# Patient Record
Sex: Female | Born: 1975
Health system: Southern US, Community
[De-identification: ages and names within clinical notes are randomized; demographics above are authoritative.]

## PROBLEM LIST (undated history)

## (undated) DIAGNOSIS — R0789 Other chest pain: Secondary | ICD-10-CM

## (undated) DIAGNOSIS — K279 Peptic ulcer, site unspecified, unspecified as acute or chronic, without hemorrhage or perforation: Secondary | ICD-10-CM

## (undated) DIAGNOSIS — E7849 Other hyperlipidemia: Secondary | ICD-10-CM

## (undated) DIAGNOSIS — R7303 Prediabetes: Secondary | ICD-10-CM

## (undated) DIAGNOSIS — F419 Anxiety disorder, unspecified: Secondary | ICD-10-CM

## (undated) DIAGNOSIS — I951 Orthostatic hypotension: Secondary | ICD-10-CM

## (undated) DIAGNOSIS — E119 Type 2 diabetes mellitus without complications: Secondary | ICD-10-CM

## (undated) DIAGNOSIS — G43909 Migraine, unspecified, not intractable, without status migrainosus: Secondary | ICD-10-CM

## (undated) DIAGNOSIS — Z8639 Personal history of other endocrine, nutritional and metabolic disease: Secondary | ICD-10-CM

## (undated) DIAGNOSIS — K8020 Calculus of gallbladder without cholecystitis without obstruction: Secondary | ICD-10-CM

## (undated) HISTORY — DX: Migraine, unspecified, not intractable, without status migrainosus: G43.909

## (undated) HISTORY — DX: Anxiety disorder, unspecified: F41.9

## (undated) HISTORY — DX: Orthostatic hypotension: I95.1

## (undated) HISTORY — DX: Other hyperlipidemia: E78.49

## (undated) HISTORY — DX: Calculus of gallbladder without cholecystitis without obstruction: K80.20

## (undated) HISTORY — DX: Other chest pain: R07.89

## (undated) HISTORY — DX: Prediabetes: R73.03

## (undated) HISTORY — DX: Peptic ulcer, site unspecified, unspecified as acute or chronic, without hemorrhage or perforation: K27.9

## (undated) HISTORY — DX: Personal history of other endocrine, nutritional and metabolic disease: Z86.39

---

## 2000-03-10 ENCOUNTER — Inpatient Hospital Stay (HOSPITAL_COMMUNITY): Admission: AD | Admit: 2000-03-10 | Discharge: 2000-03-10 | Payer: Self-pay | Admitting: Obstetrics

## 2000-03-10 ENCOUNTER — Encounter: Payer: Self-pay | Admitting: *Deleted

## 2000-04-17 ENCOUNTER — Inpatient Hospital Stay (HOSPITAL_COMMUNITY): Admission: AD | Admit: 2000-04-17 | Discharge: 2000-04-17 | Payer: Self-pay | Admitting: Obstetrics

## 2000-04-17 ENCOUNTER — Encounter: Payer: Self-pay | Admitting: Obstetrics

## 2000-07-16 ENCOUNTER — Ambulatory Visit (HOSPITAL_COMMUNITY): Admission: RE | Admit: 2000-07-16 | Discharge: 2000-07-16 | Payer: Self-pay | Admitting: *Deleted

## 2000-07-17 ENCOUNTER — Encounter: Payer: Self-pay | Admitting: *Deleted

## 2000-07-17 ENCOUNTER — Inpatient Hospital Stay (HOSPITAL_COMMUNITY): Admission: RE | Admit: 2000-07-17 | Discharge: 2000-07-17 | Payer: Self-pay | Admitting: *Deleted

## 2000-08-15 ENCOUNTER — Encounter: Admission: RE | Admit: 2000-08-15 | Discharge: 2000-08-15 | Payer: Self-pay | Admitting: Obstetrics

## 2000-08-16 ENCOUNTER — Encounter: Payer: Self-pay | Admitting: *Deleted

## 2000-08-27 ENCOUNTER — Inpatient Hospital Stay (HOSPITAL_COMMUNITY): Admission: RE | Admit: 2000-08-27 | Discharge: 2000-08-27 | Payer: Self-pay | Admitting: *Deleted

## 2000-08-29 ENCOUNTER — Encounter: Admission: RE | Admit: 2000-08-29 | Discharge: 2000-08-29 | Payer: Self-pay | Admitting: Obstetrics

## 2000-09-03 ENCOUNTER — Encounter: Payer: Self-pay | Admitting: *Deleted

## 2000-09-03 ENCOUNTER — Ambulatory Visit (HOSPITAL_COMMUNITY): Admission: RE | Admit: 2000-09-03 | Discharge: 2000-09-03 | Payer: Self-pay | Admitting: *Deleted

## 2000-09-03 ENCOUNTER — Inpatient Hospital Stay (HOSPITAL_COMMUNITY): Admission: AD | Admit: 2000-09-03 | Discharge: 2000-09-03 | Payer: Self-pay | Admitting: *Deleted

## 2000-09-17 ENCOUNTER — Encounter (HOSPITAL_COMMUNITY): Admission: RE | Admit: 2000-09-17 | Discharge: 2000-10-16 | Payer: Self-pay | Admitting: *Deleted

## 2000-09-24 ENCOUNTER — Encounter: Payer: Self-pay | Admitting: *Deleted

## 2000-10-18 ENCOUNTER — Encounter (HOSPITAL_COMMUNITY): Admission: RE | Admit: 2000-10-18 | Discharge: 2000-11-17 | Payer: Self-pay | Admitting: *Deleted

## 2000-10-18 ENCOUNTER — Encounter: Payer: Self-pay | Admitting: *Deleted

## 2000-10-25 ENCOUNTER — Encounter: Payer: Self-pay | Admitting: *Deleted

## 2000-10-31 ENCOUNTER — Encounter: Admission: RE | Admit: 2000-10-31 | Discharge: 2001-01-29 | Payer: Self-pay | Admitting: *Deleted

## 2000-11-05 ENCOUNTER — Encounter: Payer: Self-pay | Admitting: *Deleted

## 2000-11-19 ENCOUNTER — Encounter: Payer: Self-pay | Admitting: *Deleted

## 2000-11-19 ENCOUNTER — Encounter (HOSPITAL_COMMUNITY): Admission: RE | Admit: 2000-11-19 | Discharge: 2000-12-19 | Payer: Self-pay | Admitting: *Deleted

## 2000-11-26 ENCOUNTER — Encounter: Payer: Self-pay | Admitting: *Deleted

## 2000-11-29 ENCOUNTER — Observation Stay (HOSPITAL_COMMUNITY): Admission: AD | Admit: 2000-11-29 | Discharge: 2000-11-30 | Payer: Self-pay | Admitting: Obstetrics & Gynecology

## 2000-11-30 ENCOUNTER — Encounter: Payer: Self-pay | Admitting: Obstetrics & Gynecology

## 2004-01-19 ENCOUNTER — Ambulatory Visit: Payer: Self-pay | Admitting: Internal Medicine

## 2004-02-02 ENCOUNTER — Emergency Department (HOSPITAL_COMMUNITY): Admission: EM | Admit: 2004-02-02 | Discharge: 2004-02-02 | Payer: Self-pay | Admitting: Family Medicine

## 2004-02-03 ENCOUNTER — Ambulatory Visit (HOSPITAL_COMMUNITY): Admission: RE | Admit: 2004-02-03 | Discharge: 2004-02-03 | Payer: Self-pay | Admitting: Family Medicine

## 2004-09-01 ENCOUNTER — Inpatient Hospital Stay (HOSPITAL_COMMUNITY): Admission: AD | Admit: 2004-09-01 | Discharge: 2004-09-04 | Payer: Self-pay | Admitting: Obstetrics & Gynecology

## 2005-04-20 ENCOUNTER — Emergency Department (HOSPITAL_COMMUNITY): Admission: EM | Admit: 2005-04-20 | Discharge: 2005-04-20 | Payer: Self-pay | Admitting: Family Medicine

## 2006-10-28 ENCOUNTER — Ambulatory Visit: Payer: Self-pay | Admitting: Family Medicine

## 2006-10-28 ENCOUNTER — Ambulatory Visit: Payer: Self-pay | Admitting: *Deleted

## 2007-02-17 ENCOUNTER — Ambulatory Visit: Payer: Self-pay | Admitting: Family Medicine

## 2007-02-18 ENCOUNTER — Ambulatory Visit (HOSPITAL_COMMUNITY): Admission: RE | Admit: 2007-02-18 | Discharge: 2007-02-18 | Payer: Self-pay | Admitting: Family Medicine

## 2007-07-11 ENCOUNTER — Emergency Department (HOSPITAL_COMMUNITY): Admission: EM | Admit: 2007-07-11 | Discharge: 2007-07-11 | Payer: Self-pay | Admitting: Emergency Medicine

## 2007-09-14 ENCOUNTER — Emergency Department (HOSPITAL_COMMUNITY): Admission: EM | Admit: 2007-09-14 | Discharge: 2007-09-14 | Payer: Self-pay | Admitting: Family Medicine

## 2008-04-16 ENCOUNTER — Encounter (INDEPENDENT_AMBULATORY_CARE_PROVIDER_SITE_OTHER): Payer: Self-pay | Admitting: Internal Medicine

## 2008-05-11 ENCOUNTER — Emergency Department (HOSPITAL_COMMUNITY): Admission: EM | Admit: 2008-05-11 | Discharge: 2008-05-11 | Payer: Self-pay | Admitting: Family Medicine

## 2008-05-26 ENCOUNTER — Emergency Department (HOSPITAL_COMMUNITY): Admission: EM | Admit: 2008-05-26 | Discharge: 2008-05-26 | Payer: Self-pay | Admitting: Emergency Medicine

## 2008-06-29 ENCOUNTER — Emergency Department (HOSPITAL_COMMUNITY): Admission: EM | Admit: 2008-06-29 | Discharge: 2008-06-29 | Payer: Self-pay | Admitting: Emergency Medicine

## 2008-07-07 ENCOUNTER — Emergency Department (HOSPITAL_COMMUNITY): Admission: EM | Admit: 2008-07-07 | Discharge: 2008-07-07 | Payer: Self-pay | Admitting: Family Medicine

## 2008-07-26 ENCOUNTER — Emergency Department (HOSPITAL_COMMUNITY): Admission: EM | Admit: 2008-07-26 | Discharge: 2008-07-26 | Payer: Self-pay | Admitting: Family Medicine

## 2009-01-10 ENCOUNTER — Emergency Department (HOSPITAL_COMMUNITY): Admission: EM | Admit: 2009-01-10 | Discharge: 2009-01-10 | Payer: Self-pay | Admitting: Family Medicine

## 2009-11-27 ENCOUNTER — Emergency Department (HOSPITAL_COMMUNITY): Admission: EM | Admit: 2009-11-27 | Discharge: 2009-11-27 | Payer: Self-pay | Admitting: Family Medicine

## 2010-06-28 LAB — CBC
Hemoglobin: 13.4 g/dL (ref 12.0–15.0)
MCV: 83.7 fL (ref 78.0–100.0)
RDW: 12.7 % (ref 11.5–15.5)
WBC: 11.1 10*3/uL — ABNORMAL HIGH (ref 4.0–10.5)

## 2010-06-28 LAB — DIFFERENTIAL
Basophils Absolute: 0 10*3/uL (ref 0.0–0.1)
Basophils Relative: 0 % (ref 0–1)
Eosinophils Relative: 1 % (ref 0–5)
Lymphocytes Relative: 8 % — ABNORMAL LOW (ref 12–46)
Monocytes Absolute: 0.4 10*3/uL (ref 0.1–1.0)
Neutro Abs: 9.7 10*3/uL — ABNORMAL HIGH (ref 1.7–7.7)

## 2010-06-28 LAB — POCT I-STAT, CHEM 8
BUN: 5 mg/dL — ABNORMAL LOW (ref 6–23)
Creatinine, Ser: 0.5 mg/dL (ref 0.4–1.2)
Sodium: 134 mEq/L — ABNORMAL LOW (ref 135–145)
TCO2: 24 mmol/L (ref 0–100)

## 2010-06-29 LAB — POCT H PYLORI SCREEN: H. PYLORI SCREEN, POC: NEGATIVE

## 2010-08-04 NOTE — Discharge Summary (Signed)
[facility]  Patient:    Rachel [name], Rachel [name] Visit Number: [id number] MRN: [id number]          Service Type: ANT Location: [facility] Attending Physician:  Michaelle Copas Dictated by:   Conni Elliot, M.D. Admit Date:  11/19/2000 Discharge Date: 12/19/2000                             Discharge Summary  HISTORY OF PRESENT ILLNESS:   This is a 35 year old, gravida 1, para 0, at 37-5/[redacted] weeks gestation, last menstrual period March 09, 2000 confirmed by an 18-week ultrasound. 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 The patient is admitted at this time due to hypertension.  PHYSICAL EXAMINATION:         Physical exam on admission showed blood pressure of 124/91. Chest clear. Heart normal sinus rhythm. DTRs 1-2+ without clonus.  HOSPITAL COURSE:              The patient was admitted for observation. She was felt to need to be transferred to [facility] due to ultrasound findings. At time of discharge, patient was 37-6/[redacted] weeks gestation by last menstrual period and first trimester ultrasound. She had had progressive ventriculomegaly secondary to Arnold-Chiari malformation. She had gestational diabetes, diet controlled, with normal fasting blood sugars. The patient was now hypertensive with dipstick proteinuria. The patient had progressive diminution in estimated fetal weight and now there is decreased long-term fetal heart rate variability as well as decreased fetal activity. There is evidence of elevated umbilical artery Doppler flow and now oligohydramnios by amniotic fluid index. In addition, she had an abnormal middle cerebral artery finding consistent with fetal hypoxemia. This situation was discussed with Dr. Adonis Housekeeper at [facility] and he agreed with the transfer to [facility] for problem delivery. Dictated by:    Conni Elliot, M.D. Attending Physician:  Michaelle Copas DD:  12/24/00 TD:  12/25/00 Job: [id number]

## 2011-08-03 ENCOUNTER — Encounter (HOSPITAL_COMMUNITY): Payer: Self-pay

## 2011-08-03 ENCOUNTER — Emergency Department (HOSPITAL_COMMUNITY)
Admission: EM | Admit: 2011-08-03 | Discharge: 2011-08-03 | Disposition: A | Discharge status: Home / Self Care | Payer: BC Managed Care – PPO | Source: Home / Self Care | Attending: Family Medicine | Admitting: Family Medicine

## 2011-08-03 DIAGNOSIS — J020 Streptococcal pharyngitis: Secondary | ICD-10-CM

## 2011-08-03 LAB — POCT RAPID STREP A: Streptococcus, Group A Screen (Direct): POSITIVE — AB

## 2011-08-03 MED ORDER — HYDROCODONE-ACETAMINOPHEN 7.5-500 MG/15ML PO SOLN: 10.0000 mL | Freq: Three times a day (TID) | ORAL | Status: AC | PRN

## 2011-08-03 MED ORDER — ACETAMINOPHEN 325 MG PO TABS
ORAL_TABLET | ORAL | Status: AC
  Filled 2011-08-03: qty 3

## 2011-08-03 MED ORDER — AMOXICILLIN 500 MG PO CAPS: 500.0000 mg | ORAL_CAPSULE | Freq: Three times a day (TID) | ORAL | Status: AC

## 2011-08-03 MED ORDER — ACETAMINOPHEN 325 MG PO TABS
975.0000 mg | ORAL_TABLET | Freq: Once | ORAL | Status: AC
  Administered 2011-08-03: 975 mg via ORAL

## 2011-08-03 MED ORDER — IBUPROFEN 600 MG PO TABS: 600.0000 mg | ORAL_TABLET | Freq: Three times a day (TID) | ORAL | Status: AC | PRN

## 2011-08-03 NOTE — Discharge Instructions (Signed)
Your strep test is positive. Is very important top keep well hydrated. Take the prescribed medications as instructed. Be aware that hydrocodone can make you drowsy and he should not drive after taking this medication Take ibuprofen scheduled every 8 hours for the next 24-48 hours take with food and plenty of liquids as it can upset your stomach. Use nasal saline spray at least 3 times a day. (simply saline is over the counter) Return if difficulty breathing or not keeping fluids down despite following treatment.

## 2011-08-03 NOTE — ED Notes (Signed)
C/o sore throat, headache and fever that started this am.

## 2011-08-03 NOTE — ED Notes (Signed)
Vitals obtained by cma student. 

## 2011-08-06 NOTE — ED Provider Notes (Signed)
History     CSN: [id number]  Arrival date & time 08/03/11  1478   First MD Initiated Contact with Patient 08/03/11 1925      Chief Complaint  Patient presents with  . Sore Throat    (Consider location/radiation/quality/duration/timing/severity/associated sxs/prior treatment) HPI Comments: 36 y/o female h/o recurrent pharyngitis here c/o 1 day with headache, fever, sore throat and body aches. Denies difficulty breathing, chest pain or shortness or breath. No abdominal pain, nausea or vomiting. Pain with swallowing but drinking fluids well. No rash.   History reviewed. No pertinent past medical history.  Past Surgical History  Procedure Date  . Cesarean section     No family history on file.  History  Substance Use Topics  . Smoking status: Never Smoker   . Smokeless tobacco: Not on file  . Alcohol Use: No    OB History    Grav Para Term Preterm Abortions TAB SAB Ect Mult Living                  Review of Systems  Constitutional: Positive for fever, chills and appetite change.       10 systems reviewed: Pertinent negatives and positive symptoms as per HPI.     HENT: Positive for sore throat and trouble swallowing. Negative for congestion, rhinorrhea, drooling, neck pain and neck stiffness.   Eyes: Negative for discharge.  Respiratory: Negative for cough and shortness of breath.   Cardiovascular: Negative for chest pain.  Gastrointestinal: Negative for nausea and vomiting.  Skin: Negative for rash.  Neurological: Positive for headaches.  All other systems reviewed and are negative.    Allergies  Review of patient's allergies indicates no known allergies.  Home Medications   Current Outpatient Rx  Name Route Sig Dispense Refill  . AMOXICILLIN 500 MG PO CAPS Oral Take 1 capsule (500 mg total) by mouth 3 (three) times daily. 30 capsule 0  . HYDROCODONE-ACETAMINOPHEN 7.5-500 MG/15ML PO SOLN Oral Take 10 mLs by mouth every 8 (eight) hours as needed for pain or  cough. 120 mL 0  . IBUPROFEN 600 MG PO TABS Oral Take 1 tablet (600 mg total) by mouth every 8 (eight) hours as needed for pain or fever. 20 tablet 0    BP 122/89  Pulse 117  Temp(Src) 101.4 F (38.6 C) (Oral)  Resp 14  SpO2 100%  LMP 07/10/2011  Physical Exam  Nursing note and vitals reviewed. Constitutional: She is oriented to person, place, and time. She appears well-developed and well-nourished. No distress.  HENT:  Head: Normocephalic and atraumatic.       Nose normal. Significant pharyngeal and tonsillar erythema bilaterally with no exudates. No uvula deviation. No trismus. No peritonsillar fluctuations or edema. TM's with increased vascular markings and some dullness bilaterally no swelling or bulging   Neck: Normal range of motion. Neck supple.  Cardiovascular: Normal rate, regular rhythm and normal heart sounds.   No murmur heard. Pulmonary/Chest: Effort normal and breath sounds normal. No respiratory distress. She has no wheezes. She has no rales.  Abdominal: Soft. There is no tenderness.  Lymphadenopathy:    She has cervical adenopathy.  Neurological: She is alert and oriented to person, place, and time.  Skin: No rash noted.    ED Course  Procedures (including critical care time)  Labs Reviewed  POCT RAPID STREP A ([facility] URG CARE ONLY) - Abnormal; Notable for the following:    Streptococcus, Group A Screen (Direct) POSITIVE (*)    All other components  within normal limits  LAB REPORT - SCANNED   No results found.   1. Strep pharyngitis       MDM  Treated with amoxicillin, ibuprofen and hydrocodone elixir. Asked to return if worsening symptoms or failure to improve after taking antibiotic for 24-48 hours.        Sharin Grave, MD 08/06/11 1222

## 2012-02-22 ENCOUNTER — Other Ambulatory Visit (HOSPITAL_COMMUNITY)
Admission: RE | Admit: 2012-02-22 | Discharge: 2012-02-22 | Disposition: A | Discharge status: Home / Self Care | Payer: BC Managed Care – PPO | Source: Ambulatory Visit | Attending: Family Medicine | Admitting: Family Medicine

## 2012-02-22 DIAGNOSIS — Z01419 Encounter for gynecological examination (general) (routine) without abnormal findings: Secondary | ICD-10-CM | POA: Insufficient documentation

## 2012-06-03 ENCOUNTER — Encounter (INDEPENDENT_AMBULATORY_CARE_PROVIDER_SITE_OTHER): Payer: BC Managed Care – PPO

## 2012-06-03 DIAGNOSIS — M7989 Other specified soft tissue disorders: Secondary | ICD-10-CM

## 2012-06-06 ENCOUNTER — Telehealth: Payer: Self-pay | Admitting: Orthopedic Surgery

## 2012-06-19 NOTE — Telephone Encounter (Signed)
PV Report Faxed to Dr. Thurston Hole

## 2014-01-01 ENCOUNTER — Emergency Department (HOSPITAL_COMMUNITY)
Admission: EM | Admit: 2014-01-01 | Discharge: 2014-01-01 | Disposition: A | Discharge status: Home / Self Care | Payer: BC Managed Care – PPO | Attending: Emergency Medicine | Admitting: Emergency Medicine

## 2014-01-01 ENCOUNTER — Emergency Department (HOSPITAL_COMMUNITY): Payer: BC Managed Care – PPO

## 2014-01-01 DIAGNOSIS — R1010 Upper abdominal pain, unspecified: Secondary | ICD-10-CM | POA: Diagnosis present

## 2014-01-01 DIAGNOSIS — Z9889 Other specified postprocedural states: Secondary | ICD-10-CM | POA: Diagnosis not present

## 2014-01-01 DIAGNOSIS — Z3202 Encounter for pregnancy test, result negative: Secondary | ICD-10-CM | POA: Diagnosis not present

## 2014-01-01 DIAGNOSIS — R110 Nausea: Secondary | ICD-10-CM | POA: Insufficient documentation

## 2014-01-01 DIAGNOSIS — R1084 Generalized abdominal pain: Secondary | ICD-10-CM | POA: Insufficient documentation

## 2014-01-01 LAB — URINALYSIS, ROUTINE W REFLEX MICROSCOPIC
Bilirubin Urine: NEGATIVE
GLUCOSE, UA: NEGATIVE mg/dL
HGB URINE DIPSTICK: NEGATIVE
Ketones, ur: NEGATIVE mg/dL
LEUKOCYTES UA: NEGATIVE
Nitrite: NEGATIVE
PH: 7.5 (ref 5.0–8.0)
PROTEIN: NEGATIVE mg/dL
SPECIFIC GRAVITY, URINE: 1.016 (ref 1.005–1.030)
UROBILINOGEN UA: 0.2 mg/dL (ref 0.0–1.0)

## 2014-01-01 LAB — COMPREHENSIVE METABOLIC PANEL
ALK PHOS: 56 U/L (ref 39–117)
ALT: 8 U/L (ref 0–35)
ANION GAP: 13 (ref 5–15)
AST: 12 U/L (ref 0–37)
Albumin: 3.9 g/dL (ref 3.5–5.2)
BILIRUBIN TOTAL: 0.4 mg/dL (ref 0.3–1.2)
BUN: 9 mg/dL (ref 6–23)
CO2: 23 mEq/L (ref 19–32)
CREATININE: 0.45 mg/dL — AB (ref 0.50–1.10)
Calcium: 9.2 mg/dL (ref 8.4–10.5)
Chloride: 99 mEq/L (ref 96–112)
GLUCOSE: 84 mg/dL (ref 70–99)
POTASSIUM: 3.9 meq/L (ref 3.7–5.3)
Sodium: 135 mEq/L — ABNORMAL LOW (ref 137–147)
TOTAL PROTEIN: 8.3 g/dL (ref 6.0–8.3)

## 2014-01-01 LAB — CBC WITH DIFFERENTIAL/PLATELET
BASOS ABS: 0 10*3/uL (ref 0.0–0.1)
BASOS PCT: 1 % (ref 0–1)
Eosinophils Absolute: 0.1 10*3/uL (ref 0.0–0.7)
Eosinophils Relative: 2 % (ref 0–5)
HEMATOCRIT: 37.2 % (ref 36.0–46.0)
Hemoglobin: 12.5 g/dL (ref 12.0–15.0)
LYMPHS ABS: 2.1 10*3/uL (ref 0.7–4.0)
LYMPHS PCT: 48 % — AB (ref 12–46)
MCH: 27.2 pg (ref 26.0–34.0)
MCHC: 33.6 g/dL (ref 30.0–36.0)
MCV: 81 fL (ref 78.0–100.0)
Monocytes Absolute: 0.4 10*3/uL (ref 0.1–1.0)
Monocytes Relative: 10 % (ref 3–12)
NEUTROS PCT: 39 % — AB (ref 43–77)
Neutro Abs: 1.7 10*3/uL (ref 1.7–7.7)
Platelets: 309 10*3/uL (ref 150–400)
RBC: 4.59 MIL/uL (ref 3.87–5.11)
RDW: 13.2 % (ref 11.5–15.5)
WBC: 4.3 10*3/uL (ref 4.0–10.5)

## 2014-01-01 LAB — LIPASE, BLOOD: Lipase: 36 U/L (ref 11–59)

## 2014-01-01 LAB — POC URINE PREG, ED: PREG TEST UR: NEGATIVE

## 2014-01-01 MED ORDER — RANITIDINE HCL 150 MG PO CAPS: 150.0000 mg | ORAL_CAPSULE | Freq: Every day | ORAL | Status: DC

## 2014-01-01 MED ORDER — ONDANSETRON HCL 4 MG/2ML IJ SOLN
4.0000 mg | Freq: Once | INTRAMUSCULAR | Status: AC
  Administered 2014-01-01: 4 mg via INTRAVENOUS
  Filled 2014-01-01: qty 2

## 2014-01-01 MED ORDER — GI COCKTAIL ~~LOC~~
30.0000 mL | Freq: Once | ORAL | Status: AC
  Administered 2014-01-01: 30 mL via ORAL
  Filled 2014-01-01: qty 30

## 2014-01-01 MED ORDER — MORPHINE SULFATE 4 MG/ML IJ SOLN
4.0000 mg | Freq: Once | INTRAMUSCULAR | Status: AC
  Administered 2014-01-01: 4 mg via INTRAVENOUS
  Filled 2014-01-01: qty 1

## 2014-01-01 MED ORDER — FENTANYL CITRATE 0.05 MG/ML IJ SOLN
50.0000 ug | Freq: Once | INTRAMUSCULAR | Status: AC
  Administered 2014-01-01: 50 ug via INTRAVENOUS
  Filled 2014-01-01: qty 2

## 2014-01-01 MED ORDER — SODIUM CHLORIDE 0.9 % IV BOLUS (SEPSIS)
1000.0000 mL | Freq: Once | INTRAVENOUS | Status: AC
  Administered 2014-01-01: 1000 mL via INTRAVENOUS

## 2014-01-01 NOTE — ED Provider Notes (Signed)
CSN: [id number]     Arrival date & time 01/01/14  1113 History   First MD Initiated Contact with Patient 01/01/14 1532     Chief Complaint  Patient presents with  . Abdominal Pain     (Consider location/radiation/quality/duration/timing/severity/associated sxs/prior Treatment) HPI Rachel [name] is a 38 y.o. female with no medical problems who reports to ER with complaint of upper abdominal pain. Pt states pain began 1-2 wks ago. States it is worsening. No with nausea. No vomiting. States pain is constant now radiating around right side. Went to see her PCP, given ultram which she is not taking. States she is taking tylenol which is not helping either. Pt denies fever or chills. No urinary symptoms. No back pain. Normal bowel movements, last yesterday.      No past medical history on file. Past Surgical History  Procedure Laterality Date  . Cesarean section     No family history on file. History  Substance Use Topics  . Smoking status: Never Smoker   . Smokeless tobacco: Not on file  . Alcohol Use: No   OB History   Grav Para Term Preterm Abortions TAB SAB Ect Mult Living                 Review of Systems  Constitutional: Negative for fever and chills.  Respiratory: Negative for cough, chest tightness and shortness of breath.   Cardiovascular: Negative for chest pain, palpitations and leg swelling.  Gastrointestinal: Positive for nausea and abdominal pain. Negative for vomiting, diarrhea and constipation.  Genitourinary: Negative for dysuria and flank pain.  Musculoskeletal: Negative for arthralgias, myalgias, neck pain and neck stiffness.  Skin: Negative for rash.  Neurological: Negative for dizziness, weakness and headaches.  All other systems reviewed and are negative.     Allergies  Review of patient's allergies indicates no known allergies.  Home Medications   Prior to Admission medications   Not on File   BP 99/63  Pulse 85  Temp(Src) 97.9 F (36.6 C)   Resp 20  Ht 5\' 6"  (1.676 m)  Wt 172 lb (78.019 kg)  BMI 27.77 kg/m2  SpO2 100% Physical Exam  Nursing note and vitals reviewed. Constitutional: She appears well-developed and well-nourished. No distress.  HENT:  Head: Normocephalic.  Eyes: Conjunctivae are normal.  Neck: Neck supple.  Cardiovascular: Normal rate, regular rhythm and normal heart sounds.   Pulmonary/Chest: Effort normal and breath sounds normal. No respiratory distress. She has no wheezes. She has no rales.  Abdominal: Soft. Bowel sounds are normal. She exhibits no distension. There is tenderness. There is no rebound.  RUQ tenderness  Musculoskeletal: She exhibits no edema.  Neurological: She is alert.  Skin: Skin is warm and dry.  Psychiatric: She has a normal mood and affect. Her behavior is normal.    ED Course  Procedures (including critical care time) Labs Review Labs Reviewed  CBC WITH DIFFERENTIAL - Abnormal; Notable for the following:    Neutrophils Relative % 39 (*)    Lymphocytes Relative 48 (*)    All other components within normal limits  COMPREHENSIVE METABOLIC PANEL - Abnormal; Notable for the following:    Sodium 135 (*)    Creatinine, Ser 0.45 (*)    All other components within normal limits  LIPASE, BLOOD  URINALYSIS, ROUTINE W REFLEX MICROSCOPIC  POC URINE PREG, ED    Imaging Review No results found.   EKG Interpretation None      MDM   Final diagnoses:  None  Pt with RUQ pain, tenderness on exam. Will gt labs, US.   5:02 PM Labs and UA unremarkable. Pain meds ordered. Will get US abd to evaluate gall bladder.   Pt signed out at shift change to PA Ehrenbergran.  D/c based on US results   Lottie Musselatyana A Leeandre Nordling, PA-C 01/04/14 1335

## 2014-01-01 NOTE — Discharge Instructions (Signed)
Please follow up with your GI specialist Dr. Dulce Sellarutlaw for further management of your abdominal pain. Continue with protonix and Zantac   Abdominal Pain, Women Abdominal (stomach, pelvic, or belly) pain can be caused by many things. It is important to tell your doctor:  The location of the pain.  Does it come and go or is it present all the time?  Are there things that start the pain (eating certain foods, exercise)?  Are there other symptoms associated with the pain (fever, nausea, vomiting, diarrhea)? All of this is helpful to know when trying to find the cause of the pain. CAUSES   Stomach: virus or bacteria infection, or ulcer.  Intestine: appendicitis (inflamed appendix), regional ileitis (Crohn's disease), ulcerative colitis (inflamed colon), irritable bowel syndrome, diverticulitis (inflamed diverticulum of the colon), or cancer of the stomach or intestine.  Gallbladder disease or stones in the gallbladder.  Kidney disease, kidney stones, or infection.  Pancreas infection or cancer.  Fibromyalgia (pain disorder).  Diseases of the female organs:  Uterus: fibroid (non-cancerous) tumors or infection.  Fallopian tubes: infection or tubal pregnancy.  Ovary: cysts or tumors.  Pelvic adhesions (scar tissue).  Endometriosis (uterus lining tissue growing in the pelvis and on the pelvic organs).  Pelvic congestion syndrome (female organs filling up with blood just before the menstrual period).  Pain with the menstrual period.  Pain with ovulation (producing an egg).  Pain with an IUD (intrauterine device, birth control) in the uterus.  Cancer of the female organs.  Functional pain (pain not caused by a disease, may improve without treatment).  Psychological pain.  Depression. DIAGNOSIS  Your doctor will decide the seriousness of your pain by doing an examination.  Blood tests.  X-rays.  Ultrasound.  CT scan (computed tomography, special type of X-ray).  MRI  (magnetic resonance imaging).  Cultures, for infection.  Barium enema (dye inserted in the large intestine, to better view it with X-rays).  Colonoscopy (looking in intestine with a lighted tube).  Laparoscopy (minor surgery, looking in abdomen with a lighted tube).  Major abdominal exploratory surgery (looking in abdomen with a large incision). TREATMENT  The treatment will depend on the cause of the pain.   Many cases can be observed and treated at home.  Over-the-counter medicines recommended by your caregiver.  Prescription medicine.  Antibiotics, for infection.  Birth control pills, for painful periods or for ovulation pain.  Hormone treatment, for endometriosis.  Nerve blocking injections.  Physical therapy.  Antidepressants.  Counseling with a psychologist or psychiatrist.  Minor or major surgery. HOME CARE INSTRUCTIONS   Do not take laxatives, unless directed by your caregiver.  Take over-the-counter pain medicine only if ordered by your caregiver. Do not take aspirin because it can cause an upset stomach or bleeding.  Try a clear liquid diet (broth or water) as ordered by your caregiver. Slowly move to a bland diet, as tolerated, if the pain is related to the stomach or intestine.  Have a thermometer and take your temperature several times a day, and record it.  Bed rest and sleep, if it helps the pain.  Avoid sexual intercourse, if it causes pain.  Avoid stressful situations.  Keep your follow-up appointments and tests, as your caregiver orders.  If the pain does not go away with medicine or surgery, you may try:  Acupuncture.  Relaxation exercises (yoga, meditation).  Group therapy.  Counseling. SEEK MEDICAL CARE IF:   You notice certain foods cause stomach pain.  Your home care  treatment is not helping your pain.  You need stronger pain medicine.  You want your IUD removed.  You feel faint or lightheaded.  You develop nausea and  vomiting.  You develop a rash.  You are having side effects or an allergy to your medicine. SEEK IMMEDIATE MEDICAL CARE IF:   Your pain does not go away or gets worse.  You have a fever.  Your pain is felt only in portions of the abdomen. The right side could possibly be appendicitis. The left lower portion of the abdomen could be colitis or diverticulitis.  You are passing blood in your stools (bright red or black tarry stools, with or without vomiting).  You have blood in your urine.  You develop chills, with or without a fever.  You pass out. MAKE SURE YOU:   Understand these instructions.  Will watch your condition.  Will get help right away if you are not doing well or get worse. Document Released: 12/31/2006 Document Revised: 07/20/2013 Document Reviewed: 01/20/2009 [facility] Patient Information 2015 [place], [place]. This information is not intended to replace advice given to you by your health care provider. Make sure you discuss any questions you have with your health care provider.

## 2014-01-01 NOTE — ED Provider Notes (Signed)
Pt with RUQ abd pain, labs are unremarkable.  Currently awaits abd US.    6:48 PM US of abdomen shows no cholelithiasis or sonographic evidence of acute cholecystitis. There is a small right pleural effusion.  Patient does have history of PUD recently treated and had a recent endoscopy that shows improvement. Patient reports improvement of pain after receiving GI cocktail. Her discharge with H2 blocker or PPI and close followup with her PCP or gastroenterologist for further care. Return precautions discussed  BP 106/70  Pulse 83  Temp(Src) 97.9 F (36.6 C)  Resp 20  Ht 5\' 6"  (1.676 m)  Wt 172 lb (78.019 kg)  BMI 27.77 kg/m2  SpO2 99%  I have reviewed nursing notes and vital signs. I personally reviewed the imaging tests through PACS system  I reviewed available ER/hospitalization records thought the EMR  Results for orders placed during the hospital encounter of 01/01/14  CBC WITH DIFFERENTIAL      Result Value Ref Range   WBC 4.3  4.0 - 10.5 K/uL   RBC 4.59  3.87 - 5.11 MIL/uL   Hemoglobin 12.5  12.0 - 15.0 g/dL   HCT 56.237.2  13.036.0 - 86.546.0 %   MCV 81.0  78.0 - 100.0 fL   MCH 27.2  26.0 - 34.0 pg   MCHC 33.6  30.0 - 36.0 g/dL   RDW 78.413.2  69.611.5 - 29.515.5 %   Platelets 309  150 - 400 K/uL   Neutrophils Relative % 39 (*) 43 - 77 %   Neutro Abs 1.7  1.7 - 7.7 K/uL   Lymphocytes Relative 48 (*) 12 - 46 %   Lymphs Abs 2.1  0.7 - 4.0 K/uL   Monocytes Relative 10  3 - 12 %   Monocytes Absolute 0.4  0.1 - 1.0 K/uL   Eosinophils Relative 2  0 - 5 %   Eosinophils Absolute 0.1  0.0 - 0.7 K/uL   Basophils Relative 1  0 - 1 %   Basophils Absolute 0.0  0.0 - 0.1 K/uL  COMPREHENSIVE METABOLIC PANEL      Result Value Ref Range   Sodium 135 (*) 137 - 147 mEq/L   Potassium 3.9  3.7 - 5.3 mEq/L   Chloride 99  96 - 112 mEq/L   CO2 23  19 - 32 mEq/L   Glucose, Bld 84  70 - 99 mg/dL   BUN 9  6 - 23 mg/dL   Creatinine, Ser 2.840.45 (*) 0.50 - 1.10 mg/dL   Calcium 9.2  8.4 - 13.210.5 mg/dL   Total Protein  8.3  6.0 - 8.3 g/dL   Albumin 3.9  3.5 - 5.2 g/dL   AST 12  0 - 37 U/L   ALT 8  0 - 35 U/L   Alkaline Phosphatase 56  39 - 117 U/L   Total Bilirubin 0.4  0.3 - 1.2 mg/dL   GFR calc non Af Amer >90  >90 mL/min   GFR calc Af Amer >90  >90 mL/min   Anion gap 13  5 - 15  LIPASE, BLOOD      Result Value Ref Range   Lipase 36  11 - 59 U/L  URINALYSIS, ROUTINE W REFLEX MICROSCOPIC      Result Value Ref Range   Color, Urine YELLOW  YELLOW   APPearance CLEAR  CLEAR   Specific Gravity, Urine 1.016  1.005 - 1.030   pH 7.5  5.0 - 8.0   Glucose, UA NEGATIVE  NEGATIVE  mg/dL   Hgb urine dipstick NEGATIVE  NEGATIVE   Bilirubin Urine NEGATIVE  NEGATIVE   Ketones, ur NEGATIVE  NEGATIVE mg/dL   Protein, ur NEGATIVE  NEGATIVE mg/dL   Urobilinogen, UA 0.2  0.0 - 1.0 mg/dL   Nitrite NEGATIVE  NEGATIVE   Leukocytes, UA NEGATIVE  NEGATIVE  POC URINE PREG, ED      Result Value Ref Range   Preg Test, Ur NEGATIVE  NEGATIVE   [place] Abdomen Complete  01/01/2014   CLINICAL DATA:  Right upper quadrant pain for 2 weeks  EXAM: ULTRASOUND ABDOMEN COMPLETE  COMPARISON:  None.  FINDINGS: Gallbladder: No gallstones or wall thickening visualized. No sonographic Murphy sign noted.  Common bile duct: Diameter: 3.2 mm  Liver: No focal lesion identified. Within normal limits in parenchymal echogenicity.  IVC: No abnormality visualized.  Pancreas: Suboptimally visualized pancreas secondary to overlying bowel gas.  Spleen: Size and appearance within normal limits.  Right Kidney: Length: 13.8 cm. Echogenicity within normal limits. No mass or hydronephrosis visualized.  Left Kidney: Length: 12.3 cm. Echogenicity within normal limits. No mass or hydronephrosis visualized.  Abdominal aorta: No aneurysm visualized.  Other findings: Small right pleural effusion.  IMPRESSION: 1. No cholelithiasis or sonographic evidence of acute cholecystitis. 2. Small right pleural effusion.   Electronically Signed   By: Elige KoHetal  Patel   On: 01/01/2014  18:10      Fayrene HelperBowie Oliviya Gilkison, PA-C 01/01/14 2004

## 2014-01-01 NOTE — ED Notes (Signed)
Pt c/o right upper quadrant pain x 2 weeks; pt sts saw PCP and given tramadol that is not helping; pt sts some nausea

## 2014-01-02 NOTE — ED Provider Notes (Signed)
Medical screening examination/treatment/procedure(s) were performed by non-physician practitioner and as supervising physician I was immediately available for consultation/collaboration.   EKG Interpretation None        Toy CookeyMegan Tilmon Wisehart, MD 01/02/14 [phone]

## 2014-01-04 NOTE — ED Provider Notes (Signed)
Medical screening examination/treatment/procedure(s) were performed by non-physician practitioner and as supervising physician I was immediately available for consultation/collaboration.   EKG Interpretation None        Dominga Mcduffie, MD 01/04/14 1616 

## 2014-01-18 ENCOUNTER — Other Ambulatory Visit (HOSPITAL_COMMUNITY): Payer: Self-pay | Admitting: Gastroenterology

## 2014-01-18 DIAGNOSIS — R1011 Right upper quadrant pain: Secondary | ICD-10-CM

## 2014-01-21 ENCOUNTER — Other Ambulatory Visit: Payer: Self-pay | Admitting: Occupational Medicine

## 2014-01-21 ENCOUNTER — Ambulatory Visit: Payer: Self-pay

## 2014-01-21 DIAGNOSIS — R7612 Nonspecific reaction to cell mediated immunity measurement of gamma interferon antigen response without active tuberculosis: Secondary | ICD-10-CM

## 2014-02-02 ENCOUNTER — Ambulatory Visit
Admission: RE | Admit: 2014-02-02 | Discharge: 2014-02-02 | Disposition: A | Discharge status: Home / Self Care | Payer: BC Managed Care – PPO | Source: Ambulatory Visit | Attending: Gastroenterology | Admitting: Gastroenterology

## 2014-02-02 ENCOUNTER — Ambulatory Visit (HOSPITAL_COMMUNITY)
Admission: RE | Admit: 2014-02-02 | Discharge: 2014-02-02 | Disposition: A | Discharge status: Home / Self Care | Payer: BC Managed Care – PPO | Source: Ambulatory Visit | Attending: Gastroenterology | Admitting: Gastroenterology

## 2014-02-02 ENCOUNTER — Other Ambulatory Visit: Payer: Self-pay | Admitting: Gastroenterology

## 2014-02-02 DIAGNOSIS — J90 Pleural effusion, not elsewhere classified: Secondary | ICD-10-CM

## 2014-02-02 DIAGNOSIS — R109 Unspecified abdominal pain: Secondary | ICD-10-CM | POA: Diagnosis present

## 2014-02-02 DIAGNOSIS — R1011 Right upper quadrant pain: Secondary | ICD-10-CM

## 2014-02-02 MED ORDER — TECHNETIUM TC 99M MEBROFENIN IV KIT
5.2000 | PACK | Freq: Once | INTRAVENOUS | Status: AC | PRN
  Administered 2014-02-02: 5 via INTRAVENOUS

## 2014-02-02 MED ORDER — SINCALIDE 5 MCG IJ SOLR
0.0200 ug/kg | Freq: Once | INTRAMUSCULAR | Status: AC
  Administered 2014-02-02: 1.6 ug via INTRAVENOUS

## 2014-05-25 ENCOUNTER — Ambulatory Visit: Payer: Self-pay

## 2014-05-25 ENCOUNTER — Other Ambulatory Visit: Payer: Self-pay | Admitting: Occupational Medicine

## 2014-05-25 DIAGNOSIS — M79671 Pain in right foot: Secondary | ICD-10-CM

## 2015-01-27 ENCOUNTER — Other Ambulatory Visit: Payer: Self-pay | Admitting: Pediatrics

## 2015-01-27 MED ORDER — NITROFURANTOIN MONOHYD MACRO 100 MG PO CAPS: 100.0000 mg | ORAL_CAPSULE | Freq: Two times a day (BID) | ORAL | Status: DC

## 2015-06-24 IMAGING — CR DG CHEST 1V
1 series · 1 of 1 positions shown · non-contrast
Comparison: None.

CLINICAL DATA: Positive QuantiFERON Gold test.

EXAM:
CHEST - 1 VIEW

[view not recorded]
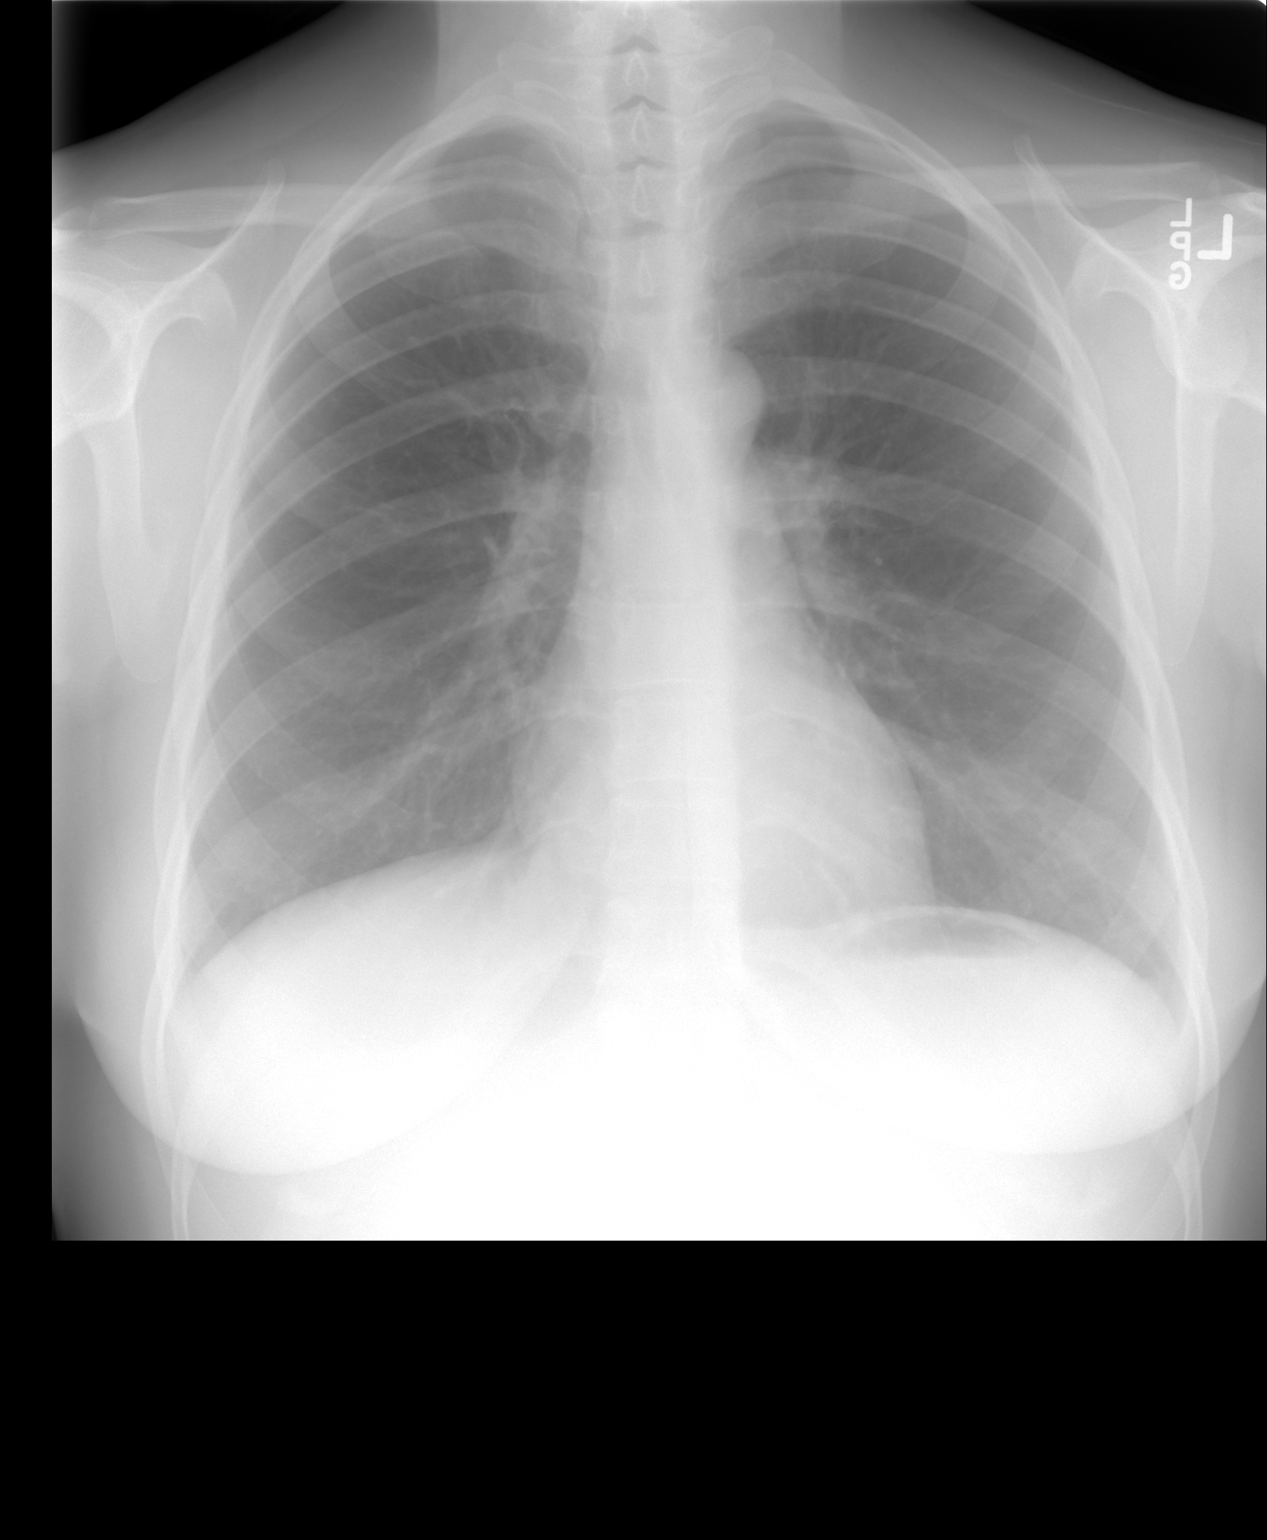

[1 of 1 positions shown; findings below may reference images not displayed]

FINDINGS: Normal heart size and mediastinal contours. No acute infiltrate or
edema. No apical lung scarring. No effusion or pneumothorax. No
acute osseous findings.
IMPRESSION: Negative chest.  No evidence of active tuberculosis.

## 2015-07-20 ENCOUNTER — Ambulatory Visit (INDEPENDENT_AMBULATORY_CARE_PROVIDER_SITE_OTHER): Payer: 59

## 2015-07-20 ENCOUNTER — Telehealth: Payer: Self-pay | Admitting: Emergency Medicine

## 2015-07-20 ENCOUNTER — Ambulatory Visit (INDEPENDENT_AMBULATORY_CARE_PROVIDER_SITE_OTHER): Payer: 59 | Admitting: Internal Medicine

## 2015-07-20 VITALS — BP 122/80 | HR 86 | Temp 98.0°F | Resp 18 | Ht 60.6 in | Wt 175.0 lb

## 2015-07-20 DIAGNOSIS — M542 Cervicalgia: Secondary | ICD-10-CM | POA: Diagnosis not present

## 2015-07-20 DIAGNOSIS — R079 Chest pain, unspecified: Secondary | ICD-10-CM | POA: Diagnosis not present

## 2015-07-20 DIAGNOSIS — R0602 Shortness of breath: Secondary | ICD-10-CM | POA: Diagnosis not present

## 2015-07-20 LAB — POCT CBC
GRANULOCYTE PERCENT: 42.9 % (ref 37–80)
HCT, POC: 34.2 % — AB (ref 37.7–47.9)
HEMOGLOBIN: 12.3 g/dL (ref 12.2–16.2)
Lymph, poc: 2.4 (ref 0.6–3.4)
MCH, POC: 27.9 pg (ref 27–31.2)
MCHC: 36 g/dL — AB (ref 31.8–35.4)
MCV: 77.5 fL — AB (ref 80–97)
MID (cbc): 0.3 (ref 0–0.9)
MPV: 8.7 fL (ref 0–99.8)
PLATELET COUNT, POC: 300 10*3/uL (ref 142–424)
POC GRANULOCYTE: 2 (ref 2–6.9)
POC LYMPH PERCENT: 51.5 %L — AB (ref 10–50)
POC MID %: 5.6 % (ref 0–12)
RBC: 4.41 M/uL (ref 4.04–5.48)
RDW, POC: 13.4 %
WBC: 4.7 10*3/uL (ref 4.6–10.2)

## 2015-07-20 LAB — COMPREHENSIVE METABOLIC PANEL
ALT: 7 U/L (ref 6–29)
AST: 12 U/L (ref 10–30)
Albumin: 4 g/dL (ref 3.6–5.1)
Alkaline Phosphatase: 48 U/L (ref 33–115)
BUN: 8 mg/dL (ref 7–25)
CO2: 23 mmol/L (ref 20–31)
CREATININE: 0.45 mg/dL — AB (ref 0.50–1.10)
Calcium: 8.9 mg/dL (ref 8.6–10.2)
Chloride: 102 mmol/L (ref 98–110)
Glucose, Bld: 97 mg/dL (ref 65–99)
POTASSIUM: 4.1 mmol/L (ref 3.5–5.3)
Sodium: 135 mmol/L (ref 135–146)
Total Bilirubin: 0.7 mg/dL (ref 0.2–1.2)
Total Protein: 7.2 g/dL (ref 6.1–8.1)

## 2015-07-20 LAB — LIPID PANEL
CHOL/HDL RATIO: 6.1 ratio — AB (ref <=5.0)
Cholesterol: 325 mg/dL — ABNORMAL HIGH (ref 125–200)
HDL: 53 mg/dL (ref >=46)
LDL CALC: 254 mg/dL — AB (ref <130)
TRIGLYCERIDES: 89 mg/dL (ref <150)
VLDL: 18 mg/dL (ref <30)

## 2015-07-20 LAB — D-DIMER, QUANTITATIVE: D-Dimer, Quant: 0.29 ug/mL-FEU (ref 0.00–0.48)

## 2015-07-20 MED ORDER — KETOROLAC TROMETHAMINE 30 MG/ML IJ SOLN
30.0000 mg | Freq: Once | INTRAMUSCULAR | Status: AC
  Administered 2015-07-20: 30 mg via INTRAMUSCULAR

## 2015-07-20 MED ORDER — CYCLOBENZAPRINE HCL 10 MG PO TABS: 10.0000 mg | ORAL_TABLET | Freq: Every day | ORAL | Status: DC

## 2015-07-20 MED ORDER — TRAMADOL HCL 50 MG PO TABS
50.0000 mg | ORAL_TABLET | Freq: Every day | ORAL | Status: DC
Start: 2015-07-20 — End: 2015-10-04

## 2015-07-20 MED ORDER — MELOXICAM 15 MG PO TABS: 15.0000 mg | ORAL_TABLET | Freq: Every day | ORAL | Status: DC

## 2015-07-20 MED FILL — MELOXICAM 15 MG TABLET: 15 | 30 days supply | Qty: 30 | Fill #0

## 2015-07-20 MED FILL — traMADol HCL 50 MG TABS: 50 | 12 days supply | Qty: 12 | Fill #0

## 2015-07-20 MED FILL — CYCLOBENZAPRINE 10 MG TAB: 10 | 30 days supply | Qty: 30 | Fill #0

## 2015-07-20 NOTE — Telephone Encounter (Signed)
[facility] lab tech Shanda BumpsJessica called with blood work results No critical labs reported

## 2015-07-20 NOTE — Patient Instructions (Signed)
     IF you received an x-ray today, you will receive an invoice from [facility]. Please contact [facility] at [phone] with questions or concerns regarding your invoice.   IF you received labwork today, you will receive an invoice from [facility]. Please contact [facility] at [phone] with questions or concerns regarding your invoice.   Our billing staff will not be able to assist you with questions regarding bills from these companies.  You will be contacted with the lab results as soon as they are available. The fastest way to get your results is to activate your My Chart account. Instructions are located on the last page of this paperwork. If you have not heard from us regarding the results in 2 weeks, please contact this office.      

## 2015-07-20 NOTE — Progress Notes (Addendum)
Subjective:    Patient ID: Rachel [name], female    DOB: 1975/12/25, 40 y.o.   MRN: [id number]  Chief Complaint  Patient presents with  . Chest Pain    Left side    HPI Rachel [name] is a 40 y.o. female who presents to the [facility] complaining of sharp and constant CP with SOB that started while pt was at work this morning, about 8:15. 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 Pain is worsened by taking a deep breath and by lifting her left arm. No palpitations. She did experience sweating. No nausea. No leg swelling or edema. No recent injuries. CP worsened by flexion of the neck.  She is on no medications and has no chronic problems(she was diagnosed with hyperlipidemia. Years ago and put on Crestor which she took for a few months and then discontinued. She has no primary care. There is also a past history of anemia.)  She has had symptoms like this previously, about a year ago. She was evaluated in the ED in [place], [place], where an EKG was reportedly normal. No specific cause of the pain was identified, and she was discharged without any specific treatment. The pain resolved spontaneously.  She has a history of PUD, no longer on treatment. Her symptoms are not similar to when she had ulcer disease.  Pt states that she has increased the amount of typing and computer work recently and has noticed a lot of neck tension.   Pt has insomnia due to increased stress of raising a disabled child. One other child. Good relationship with husband.  Family history-no heart disease other than sister who had 2 valve problems needing surgery as a child  Review of Systems  Constitutional: Negative for fever, chills, activity change, appetite change, fatigue and unexpected weight change.  HENT: Negative for trouble swallowing.   Eyes: Negative for visual disturbance.  Respiratory: Negative for shortness of  breath and wheezing.   Cardiovascular: Negative for palpitations and leg swelling.  Gastrointestinal: Negative for abdominal pain.  Genitourinary: Negative for difficulty urinating.  Musculoskeletal: Negative for gait problem.  Skin: Negative for rash.  Neurological: Negative for headaches.  Hematological: Does not bruise/bleed easily.        Objective:   Physical Exam  Constitutional: She is oriented to person, place, and time. She appears well-developed and well-nourished.  BP 122/80 mmHg  Pulse 86  Temp(Src) 98 F (36.7 C) (Oral)  Resp 18  Ht 5' 0.6" (1.539 m)  Wt 175 lb (79.379 kg)  BMI 33.51 kg/m2  SpO2 100%  LMP 07/02/2015   HENT:  Head: Normocephalic and atraumatic.  Mouth/Throat: Oropharynx is clear and moist.  Eyes: Conjunctivae and EOM are normal. No scleral icterus.  Neck: Normal range of motion. Neck supple. No JVD present. No tracheal deviation present. No thyromegaly present.  Neck extension and rotation to the right both increase the anterior chest pain and  pain in the left trapezius area  Cardiovascular: Normal rate, regular rhythm, normal heart sounds and intact distal pulses.  Exam reveals no gallop.   No murmur heard. Pulmonary/Chest: Effort normal and breath sounds normal. No respiratory distress. She has no decreased breath sounds. She has no wheezes. She has no rhonchi. She has no rales. Chest wall is not dull to percussion. She exhibits tenderness and bony tenderness. She exhibits no mass, no laceration, no crepitus, no edema, no deformity, no  swelling and no retraction.    Inspiration causes increased pain in the LEFT upper chest. She is tender to palpation over the left pectoral area without costosternal joint tenderness  Musculoskeletal: She exhibits no edema.       Cervical back: She exhibits tenderness and pain. She exhibits normal range of motion, no bony tenderness, no swelling, no edema, no deformity and no laceration.       Back:  No swelling  in extremities  Lymphadenopathy:    She has no cervical adenopathy.  Neurological: She is alert and oriented to person, place, and time. No cranial nerve deficit or sensory deficit. She exhibits normal muscle tone. Coordination normal.  Skin: Skin is warm and dry. No rash noted.  Psychiatric: Her speech is normal and behavior is normal. Judgment and thought content normal. Her mood appears anxious. Her affect is not angry, not blunt, not labile and not inappropriate. Cognition and memory are normal. She does not exhibit a depressed mood.    EKG: NSR. No ischemia or LVH.   Dg Chest 2 View per radiology= 07/20/2015 CHEST  2 VIEW COMPARISON:  No prior. FINDINGS: Mediastinum hilar structures normal. Low lung volumes with mild bibasilar atelectasis and/or infiltrates. Tiny pleural effusions cannot be excluded. No pneumothorax. Heart size normal. No acute bony abnormality . IMPRESSION: Low lung volumes with mild bibasilar subsegmental atelectasis and/or infiltrates. Tiny bilateral pleural effusions cannot be excluded. Electronically Signed   By: Maisie Fushomas  Register   On: 07/20/2015 10:18   Dg Cervical Spine Complete 07/20/2015  EXAM: CERVICAL SPINE - COMPLETE 4+ VIEW COMPARISON:  No recent prior. FINDINGS: No acute bony abnormality identified. Normal alignment. Neural foramen are patent. IMPRESSION: No acute or focal abnormality. Electronically Signed   By: Maisie Fushomas  Register   On: 07/20/2015 10:20    Toradol 30 mg IM with partial relief of her discomfort. Husband arrived.  Results for orders placed or performed in visit on 07/20/15  Comprehensive metabolic panel  Result Value Ref Range   Sodium 135 135 - 146 mmol/L   Potassium 4.1 3.5 - 5.3 mmol/L   Chloride 102 98 - 110 mmol/L   CO2 23 20 - 31 mmol/L   Glucose, Bld 97 65 - 99 mg/dL   BUN 8 7 - 25 mg/dL   Creat 1.610.45 (L) 0.960.50 - 1.10 mg/dL   Total Bilirubin 0.7 0.2 - 1.2 mg/dL   Alkaline Phosphatase 48 33 - 115 U/L   AST 12 10 - 30 U/L   ALT 7 6 -  29 U/L   Total Protein 7.2 6.1 - 8.1 g/dL   Albumin 4.0 3.6 - 5.1 g/dL   Calcium 8.9 8.6 - 04.510.2 mg/dL  Lipid panel  Result Value Ref Range   Cholesterol 325 (H) 125 - 200 mg/dL   Triglycerides 89 <409<150 mg/dL   HDL 53 >=81>=46 mg/dL   Total CHOL/HDL Ratio 6.1 (H) <=5.0 Ratio   VLDL 18 <30 mg/dL   LDL Cholesterol 191254 (H) <130 mg/dL  D-dimer, quantitative (not at [facility])  Result Value Ref Range   D-Dimer, Quant 0.29 0.00 - 0.48 ug/mL-FEU  POCT CBC  Result Value Ref Range   WBC 4.7 4.6 - 10.2 K/uL   Lymph, poc 2.4 0.6 - 3.4   POC LYMPH PERCENT 51.5 (A) 10 - 50 %L   MID (cbc) 0.3 0 - 0.9   POC MID % 5.6 0 - 12 %M   POC Granulocyte 2.0 2 - 6.9   Granulocyte percent 42.9 37 -  80 %G   RBC 4.41 4.04 - 5.48 M/uL   Hemoglobin 12.3 12.2 - 16.2 g/dL   HCT, POC 16.1 (A) 09.6 - 47.9 %   MCV 77.5 (A) 80 - 97 fL   MCH, POC 27.9 27 - 31.2 pg   MCHC 36.0 (A) 31.8 - 35.4 g/dL   RDW, POC 04.5 %   Platelet Count, POC 300 142 - 424 K/uL   MPV 8.7 0 - 99.8 fL        Assessment & Plan:  Chest pain, unspecified chest pain type - with Neck pain /trapezius pain --- This seems to be musculoskeletal in origin and possibly related to her posture with work plus sleep disrupted by anxiety/insomnia.  Apply heat Meds ordered this encounter  Medications  . ketorolac (TORADOL) 30 MG/ML injection 30 mg    Sig:   . cyclobenzaprine (FLEXERIL) 10 MG tablet    Sig: Take 1 tablet (10 mg total) by mouth at bedtime.    Dispense:  30 tablet    Refill:  0  . meloxicam (MOBIC) 15 MG tablet    Sig: Take 1 tablet (15 mg total) by mouth daily.    Dispense:  30 tablet    Refill:  0  . traMADol (ULTRAM) 50 MG tablet    Sig: Take 1-2 tablets (50-100 mg total) by mouth at bedtime.    Dispense:  12 tablet    Refill:  0   Recheck in 3 days-Saturday morning/sooner if worse---may need PT  Remain out of work until follow-up She plans to follow-up here for primary care with PA-C Leotis Shames and cholesterol can be addressed  again at that point-I'll start medications on Saturday

## 2015-07-21 ENCOUNTER — Telehealth: Payer: Self-pay | Admitting: Physician Assistant

## 2015-07-21 NOTE — Telephone Encounter (Signed)
Spoke with patient. She is some improved. Last night she had to prop up on two pillows to sleep. Her pain increased with lying down.  She asked for her lab results, which I reviewed with her. May need treatment for elevated cholesterol.

## 2015-07-23 ENCOUNTER — Ambulatory Visit (INDEPENDENT_AMBULATORY_CARE_PROVIDER_SITE_OTHER): Payer: 59 | Admitting: Internal Medicine

## 2015-07-23 VITALS — BP 112/66 | HR 104 | Temp 98.8°F | Resp 18 | Wt 173.0 lb

## 2015-07-23 DIAGNOSIS — E785 Hyperlipidemia, unspecified: Secondary | ICD-10-CM

## 2015-07-23 DIAGNOSIS — R079 Chest pain, unspecified: Secondary | ICD-10-CM | POA: Diagnosis not present

## 2015-07-23 MED ORDER — ATORVASTATIN CALCIUM 40 MG PO TABS: 40.0000 mg | ORAL_TABLET | Freq: Every day | ORAL | Status: DC

## 2015-07-23 MED ORDER — TRIAZOLAM 0.25 MG PO TABS
0.2500 mg | ORAL_TABLET | Freq: Every evening | ORAL | Status: DC | PRN
Start: 2015-07-23 — End: 2016-12-07

## 2015-07-23 NOTE — Progress Notes (Signed)
Subjective:  By signing my name below, I, Raven Small, attest that this documentation has been prepared under the direction and in the presence of Ellamae Sia, MD.  Electronically Signed: Andrew Au, ED Scribe. 07/23/2015. 10:18 AM.   Patient ID: Rachel [name], female    DOB: [birth year], 40 y.o.   MRN: [id number]  HPI   Chief Complaint  Patient presents with  . Follow-up    chest pain   HPI Comments: Rachel [name] is a 40 y.o. female who presents to the [facility] for follow up. Pt was seen here 3 days ago for sudden, sharp, stabbing CP.  CXR was normal. EKG was normal. Treated with Toradol injection, flexeril, mobic and tramadol. The following day after being seen, telephone note stated she had worsening pain with lying flat and had to sleep sitting up.Today pt states CP improved yesterday afternoon but did have some palpations last night. Pt reports similar episode of CP last year. She associates CP episodes with stress stating she was going through a difficult time last year around the time of having CP and has also been undergoing stress at this time. She feels that everyone's troubles fall back onto her. Pt has trouble sleeping; tends to wake up during the night with the inability of falling back to sleep. She has not tried medication to help with sleep in the past.    There are no active problems to display for this patient.  Past Medical History  Diagnosis Date  . PUD (peptic ulcer disease)    Past Surgical History  Procedure Laterality Date  . Cesarean section     No Known Allergies Prior to Admission medications   Medication Sig Start Date End Date Taking? Authorizing Provider  cyclobenzaprine (FLEXERIL) 10 MG tablet Take 1 tablet (10 mg total) by mouth at bedtime. 07/20/15  Yes Tonye Pearson, MD  meloxicam (MOBIC) 15 MG tablet Take 1 tablet (15 mg total) by mouth daily. 07/20/15  Yes Tonye Pearson, MD  Multiple Vitamin (MULTIVITAMIN WITH  MINERALS) TABS tablet Take 1 tablet by mouth daily.   Yes Historical Provider, MD  traMADol (ULTRAM) 50 MG tablet Take 1-2 tablets (50-100 mg total) by mouth at bedtime. 07/20/15  Yes Tonye Pearson, MD   Social History   Social History  . Marital Status: Married    Spouse Name: N/A  . Number of Children: 2  . Years of Education: N/A   Occupational History  . Lead RN at [employer]    Social History Main Topics  . Smoking status: Never Smoker   . Smokeless tobacco: Never Used  . Alcohol Use: No  . Drug Use: No  . Sexual Activity: Yes    Birth Control/ Protection: Condom   Other Topics Concern  . Not on file   Social History Narrative   Review of Systems  Cardiovascular: Positive for palpitations. Negative for chest pain.  Psychiatric/Behavioral: Positive for sleep disturbance.     Objective:   Physical Exam  Constitutional: She is oriented to person, place, and time. She appears well-developed and well-nourished. No distress.  HENT:  Head: Normocephalic and atraumatic.  Eyes: Conjunctivae and EOM are normal. Pupils are equal, round, and reactive to light.  Neck: Normal range of motion. Neck supple. No thyromegaly present.  Cardiovascular: Normal rate, regular rhythm and normal heart sounds.   No murmur heard. Pulmonary/Chest: Effort normal and breath sounds normal.  Musculoskeletal: Normal range of motion.  Lymphadenopathy:  She has no cervical adenopathy.  Neurological: She is alert and oriented to person, place, and time. No cranial nerve deficit.  Skin: Skin is warm and dry.  Psychiatric: She has a normal mood and affect. Her behavior is normal.  Nursing note and vitals reviewed.   Filed Vitals:   07/23/15 0934  BP: 112/66  Pulse: 104  Temp: 98.8 F (37.1 C)  Resp: 18  Weight: 173 lb (78.472 kg)  SpO2: 99%    Assessment & Plan:  Chest pain, unspecified chest pain type --at her request we will proceed with cardiology evaluation as this is  her 2nd similar episode.  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 triazolam (HALCION) 0.25 MG tablet    Sig: Take 1 tablet (0.25 mg total) by mouth at bedtime as needed for sleep. When wakes early    Dispense:  30 tablet    Refill:  2  . atorvastatin (LIPITOR) 40 MG tablet    Sig: Take 1 tablet (40 mg total) by mouth daily.    Dispense:  90 tablet    Refill:  3    I have completed the patient encounter in its entirety as documented by the scribe, with editing by me where necessary. Romey Cohea P. Merla Richesoolittle, M.D.

## 2015-07-23 NOTE — Patient Instructions (Signed)
     IF you received an x-ray today, you will receive an invoice from [facility]. Please contact [facility] at [phone] with questions or concerns regarding your invoice.   IF you received labwork today, you will receive an invoice from [facility]. Please contact [facility] at [phone] with questions or concerns regarding your invoice.   Our billing staff will not be able to assist you with questions regarding bills from these companies.  You will be contacted with the lab results as soon as they are available. The fastest way to get your results is to activate your My Chart account. Instructions are located on the last page of this paperwork. If you have not heard from us regarding the results in 2 weeks, please contact this office.      

## 2015-07-24 DIAGNOSIS — E785 Hyperlipidemia, unspecified: Secondary | ICD-10-CM | POA: Insufficient documentation

## 2015-07-25 MED FILL — ATORVASTATIN 40 MG TABLET: 40 | 90 days supply | Qty: 90 | Fill #0

## 2015-08-16 NOTE — Progress Notes (Signed)
Cardiology Office Note   Date:  08/17/2015   ID:  Rachel [name], DOB September 13, 1975, MRN [id number]  PCP:  Leanor Rubenstein, MD  Cardiologist:   Chilton Si, MD   Chief Complaint  Patient presents with  . New Patient (Initial Visit)    Lightheaded; randomly. Pt states no other Sx.    History of Present Illness: Rachel [name] is a 40 y.o. female with hyperlipidemia who presents for evaluation of chest pain.  Rachel [name] was seen by Dr. Ellamae Sia on 07/23/15 with a complaint of chest pain. She had been evaluated three days prior and thought to have musculoskeletal chest pain that was treated with the Toradol injection, Flexeril, Mobic and tramadol.  She reported that the pain was worse when laying down and she had to sleep sitting straight up. The chest pain occurs in the setting of stressful situations and has happened in the past. She requested referral to cardiology for further evaluation.   Last year she had an episode of sharp chest pain that radiated from her chest to left arm.  The pain recurred last week.  The first episode lasted for two hours but the more recent episode lasted from 8:15-3pm.  It was worse with inspiration or movement. She was at work at the time and it was 8/10 in severity.  There was no associated shortness of breath, nausea, or diaphoresis. That evening she was unable to sleep and had a problem 2 pillows. She denies PND.  Rachel [name] notes that her socks leave an imprint but has not noted edema.  She has noted more exertional shortness of breath recently.  She does not get much formal exercise, but does have to lift her 27 year old disabled son.  She has not noticed shortness of breath or chest pain with this activity.   Rachel [name] also notes lightheadedness.  For one day and reported fatigue and lightheadedness that she had a colleague took her blood pressure. It was 80/64. One hour later it was repeated and was 76/60. She had been eating and drinking well at  the time.  She reports a long-standing history of lightheadedness and low blood pressure. Her sister has similar problems. She has a fit better and notes that her heart rate typically ranges from 89-123).  She has a strong family history of hyperlipidemia.  Her mother, sisters, daughter (age 67) and niece (age 26) all have elevated lipids.  At her recent appointment with Dr. Merla Riches she was started on atorvastatin.   Past Medical History  Diagnosis Date  . PUD (peptic ulcer disease)   . Orthostatic hypotension 08/17/2015  . 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 Atypical chest pain 08/17/2015    Past Surgical History  Procedure Laterality Date  . Cesarean section       Current Outpatient Prescriptions  Medication Sig Dispense Refill  . atorvastatin (LIPITOR) 40 MG tablet Take 1 tablet (40 mg total) by mouth daily. 90 tablet 3  . cyclobenzaprine (FLEXERIL) 10 MG tablet Take 1 tablet (10 mg total) by mouth at bedtime. (Patient taking differently: Take 10 mg by mouth as needed. ) 30 tablet 0  . meloxicam (MOBIC) 15 MG tablet Take 1 tablet (15 mg total) by mouth daily. (Patient taking differently: Take 15 mg by mouth as needed. ) 30 tablet 0  . Multiple Vitamin (MULTIVITAMIN WITH MINERALS) TABS tablet Take 1 tablet by mouth daily.    . traMADol (ULTRAM) 50 MG tablet Take 1-2 tablets (50-100 mg total) by  mouth at bedtime. (Patient taking differently: Take 50-100 mg by mouth as needed. ) 12 tablet 0  . triazolam (HALCION) 0.25 MG tablet Take 1 tablet (0.25 mg total) by mouth at bedtime as needed for sleep. When wakes early 30 tablet 2  . fludrocortisone (FLORINEF) 0.1 MG tablet Take 1 tablet (0.1 mg total) by mouth daily as needed (FOR LOW BLOOD PRESSURE). 30 tablet 1   No current facility-administered medications for this visit.    Allergies:   Review of patient's allergies indicates no known allergies.    Social History:  The patient  reports that she has never smoked. She has never used  smokeless tobacco. She reports that she does not drink alcohol or use illicit drugs.   Family History:  The patient's family history includes Heart disease in her sister; Hyperlipidemia in her daughter, mother, other, and sister; Spina bifida in her son.    ROS:  Please see the history of present illness.   Otherwise, review of systems are positive for none.   All other systems are reviewed and negative.    PHYSICAL EXAM: VS:  BP 97/74 mmHg  Pulse 90  Ht 5\' 6"  (1.676 m)  Wt 77.656 kg (171 lb 3.2 oz)  BMI 27.65 kg/m2  LMP 07/02/2015 , BMI Body mass index is 27.65 kg/(m^2). GENERAL:  Well appearing HEENT:  Pupils equal round and reactive, fundi not visualized, oral mucosa unremarkable NECK:  No jugular venous distention, waveform within normal limits, carotid upstroke brisk and symmetric, no bruits, no thyromegaly LYMPHATICS:  No cervical adenopathy LUNGS:  Clear to auscultation bilaterally HEART:  RRR.  PMI not displaced or sustained,S1 and S2 within normal limits, no S3, no S4, no clicks, no rubs, no murmurs ABD:  Flat, positive bowel sounds normal in frequency in pitch, no bruits, no rebound, no guarding, no midline pulsatile mass, no hepatomegaly, no splenomegaly EXT:  2 plus pulses throughout, no edema, no cyanosis no clubbing SKIN:  No rashes no nodules NEURO:  Cranial nerves II through XII grossly intact, motor grossly intact throughout PSYCH:  Cognitively intact, oriented to person place and time    EKG:  EKG is not ordered today. The ekg ordered 07/20/15 demsinus rhythm. Rate 82 beats per minute.   Recent Labs: 07/20/2015: ALT 7; BUN 8; Creat 0.45*; Hemoglobin 12.3; Potassium 4.1; Sodium 135    Lipid Panel    Component Value Date/Time   CHOL 325* 07/20/2015 1048   TRIG 89 07/20/2015 1048   HDL 53 07/20/2015 1048   CHOLHDL 6.1* 07/20/2015 1048   VLDL 18 07/20/2015 1048   LDLCALC 254* 07/20/2015 1048      Wt Readings from Last 3 Encounters:  08/17/15 77.656 kg (171  lb 3.2 oz)  07/23/15 78.472 kg (173 lb)  07/20/15 79.379 kg (175 lb)      ASSESSMENT AND PLAN:  # Atypical chest pain: Rachel [name]'s symptoms are atypical.  However, she has a risk factor for premature coronary artery disease given that she was only recently started on treatment for familial hyperlipidemia.  Therefore, we will refer her for ETT to evaluate for ischemia.   # Familial Hyperlipidemia: Ms. Jagger's LDL was 254, which indicates that she likely has familial hyperlipidemia. She does not have tendon xanthomas or corneal arcus.Her 20 year old child and 52-year-old niece also have hyperlipidemia. She was appropriately started on atorvastatin. Her goal would be an LDL less than 100. If she is unable to obtain this on a statin she should be considered for  PCSK-9 inhibitor.   # Orthostatic hypotension: Ms. Thea SilversmithBoutaib  has documented low blood pressures and has been symptomatic. We discussed the importance of increasing her fluid and salt intake. I also suggested that she wear compression stockings. She is currently fasting for Raminan.  I suggested that she consider obtaining a medical exclusion, so that she can increase drink liquids throughout the day. She expressed understanding. I also prescribed Florinef 0.1 mg daily as needed if these interventions do not help her symptoms.   Current medicines are reviewed at length with the patient today.  The patient does not have concerns regarding medicines.  The following changes have been made:  Florinef 0.1mg  daily prn   Labs/ tests ordered today include:   Orders Placed This Encounter  Procedures  . Exercise Tolerance Test     Disposition:   FU with Sela Falk C. Duke Salviaandolph, MD, [facility] in 1 month    This note was written with the assistance of speech recognition software.  Please excuse any transcriptional errors.  Signed, Federica Allport C. Duke Salviaandolph, MD, [facility]  08/17/2015 10:09 AM    [facility]

## 2015-08-17 ENCOUNTER — Ambulatory Visit (INDEPENDENT_AMBULATORY_CARE_PROVIDER_SITE_OTHER): Payer: 59 | Admitting: Cardiovascular Disease

## 2015-08-17 ENCOUNTER — Encounter: Payer: Self-pay | Admitting: Cardiovascular Disease

## 2015-08-17 VITALS — BP 97/74 | HR 90 | Ht 66.0 in | Wt 171.2 lb

## 2015-08-17 DIAGNOSIS — E7849 Other hyperlipidemia: Secondary | ICD-10-CM | POA: Insufficient documentation

## 2015-08-17 DIAGNOSIS — R079 Chest pain, unspecified: Secondary | ICD-10-CM

## 2015-08-17 DIAGNOSIS — E785 Hyperlipidemia, unspecified: Secondary | ICD-10-CM | POA: Diagnosis not present

## 2015-08-17 DIAGNOSIS — R0789 Other chest pain: Secondary | ICD-10-CM | POA: Insufficient documentation

## 2015-08-17 DIAGNOSIS — I951 Orthostatic hypotension: Secondary | ICD-10-CM

## 2015-08-17 HISTORY — DX: Other hyperlipidemia: E78.49

## 2015-08-17 HISTORY — DX: Orthostatic hypotension: I95.1

## 2015-08-17 MED ORDER — FLUDROCORTISONE ACETATE 0.1 MG PO TABS: 0.1000 mg | ORAL_TABLET | Freq: Every day | ORAL | Status: DC

## 2015-08-17 MED ORDER — FLUDROCORTISONE ACETATE 0.1 MG PO TABS: 0.1000 mg | ORAL_TABLET | Freq: Every day | ORAL | Status: DC | PRN

## 2015-08-17 MED FILL — FLUDROCORTISONE 0.1 MG TAB: 0.1 | 30 days supply | Qty: 30 | Fill #0

## 2015-08-17 NOTE — Patient Instructions (Signed)
Medication Instructions:  START FLORINEF 0.1 MG DAILY AS NEEDED FOR LOW BLOOD PRESSURE  Labwork: NONE  Testing/Procedures: Your physician has requested that you have an exercise tolerance test. For further information please visit [email or web address]. Please also follow instruction sheet, as given.  Follow-Up: Your physician recommends that you schedule a follow-up appointment in: 1 MONTH OV  If you need a refill on your cardiac medications before your next appointment, please call your pharmacy.

## 2015-08-22 MED FILL — TRIAZOLAM 0.25 MG TABLET: 0.25 | 30 days supply | Qty: 30 | Fill #0

## 2015-08-26 ENCOUNTER — Encounter (HOSPITAL_COMMUNITY): Payer: Self-pay

## 2015-09-01 ENCOUNTER — Inpatient Hospital Stay (HOSPITAL_COMMUNITY): Admission: RE | Admit: 2015-09-01 | Payer: Self-pay | Source: Ambulatory Visit

## 2015-09-23 ENCOUNTER — Telehealth (HOSPITAL_COMMUNITY): Payer: Self-pay

## 2015-09-23 NOTE — Telephone Encounter (Signed)
Encounter complete. 

## 2015-09-28 ENCOUNTER — Ambulatory Visit (HOSPITAL_COMMUNITY)
Admission: RE | Admit: 2015-09-28 | Discharge: 2015-09-28 | Disposition: A | Discharge status: Home / Self Care | Payer: 59 | Source: Ambulatory Visit | Attending: Cardiovascular Disease | Admitting: Cardiovascular Disease

## 2015-09-28 DIAGNOSIS — R079 Chest pain, unspecified: Secondary | ICD-10-CM | POA: Diagnosis not present

## 2015-09-28 LAB — EXERCISE TOLERANCE TEST
CSEPED: 7 min
CSEPEDS: 35 s
CSEPEW: 9.4 METS
CSEPPHR: 164 {beats}/min
MPHR: 181 {beats}/min
Percent HR: 90 %
RPE: 17
Rest HR: 85 {beats}/min

## 2015-10-04 ENCOUNTER — Encounter: Payer: Self-pay | Admitting: Cardiovascular Disease

## 2015-10-04 ENCOUNTER — Ambulatory Visit (INDEPENDENT_AMBULATORY_CARE_PROVIDER_SITE_OTHER): Payer: 59 | Admitting: Cardiovascular Disease

## 2015-10-04 VITALS — BP 114/79 | HR 91 | Ht 66.0 in | Wt 172.0 lb

## 2015-10-04 DIAGNOSIS — R0789 Other chest pain: Secondary | ICD-10-CM | POA: Diagnosis not present

## 2015-10-04 DIAGNOSIS — I951 Orthostatic hypotension: Secondary | ICD-10-CM

## 2015-10-04 DIAGNOSIS — E785 Hyperlipidemia, unspecified: Secondary | ICD-10-CM

## 2015-10-04 NOTE — Progress Notes (Signed)
Cardiology Office Note   Date:  10/04/2015   ID:  Kaden Dunkel, DOB 03/30/1975, MRN [id number]  PCP:  Leanor Rubenstein, MD  Cardiologist:   Chilton Si, MD   Chief Complaint  Patient presents with  . Follow-up    History of Present Illness: Rachel [name] is a 40 y.o. female with familial hyperlipidemia who presents for follow up on chest pain and low BP.  Rachel [name] was seen by Dr. Ellamae Sia on 07/23/15 with a complaint of chest pain. She Was referred for ETT on 09/2015 that was negative for ischemia. She also complains of lightheadedness and hypotension. Her blood pressure has been as low as 76/60 in the past. This typically occurs in the setting of poor by mouth intake. She was encouraged to increase her fluid and salt intake. She was also given a prescription of florinef 0.1 mg daily. She reports that she is increased her fluid intake and is only needed to take Florinef twice. Overall she thinks that her symptoms are better-controlled.    Rachel [name] has familial hyperlipidemia and was started on atorvastatin when she last saw Dr. Merla Riches in May. She is due to see him next month and will have her lipids checked again at that time.   Past Medical History  Diagnosis Date  . PUD (peptic ulcer disease)   . Orthostatic hypotension 08/17/2015  . 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 Atypical chest pain 08/17/2015    Past Surgical History  Procedure Laterality Date  . Cesarean section       Current Outpatient Prescriptions  Medication Sig Dispense Refill  . atorvastatin (LIPITOR) 40 MG tablet Take 1 tablet (40 mg total) by mouth daily. 90 tablet 3  . cyclobenzaprine (FLEXERIL) 10 MG tablet Take 10 mg by mouth as directed.    . fludrocortisone (FLORINEF) 0.1 MG tablet Take 1 tablet (0.1 mg total) by mouth daily as needed (FOR LOW BLOOD PRESSURE). 30 tablet 1  . meloxicam (MOBIC) 15 MG tablet Take 15 mg by mouth as directed.    . Multiple Vitamin (MULTIVITAMIN WITH  MINERALS) TABS tablet Take 1 tablet by mouth daily.    . traMADol (ULTRAM) 50 MG tablet Take by mouth as directed.    . triazolam (HALCION) 0.25 MG tablet Take 1 tablet (0.25 mg total) by mouth at bedtime as needed for sleep. When wakes early 30 tablet 2   No current facility-administered medications for this visit.    Allergies:   Review of patient's allergies indicates no known allergies.    Social History:  The patient  reports that she has never smoked. She has never used smokeless tobacco. She reports that she does not drink alcohol or use illicit drugs.   Family History:  The patient's family history includes Heart disease in her sister; Hyperlipidemia in her daughter, mother, other, and sister; Spina bifida in her son.    ROS:  Please see the history of present illness.   Otherwise, review of systems are positive for none.   All other systems are reviewed and negative.    PHYSICAL EXAM: VS:  BP 114/79 mmHg  Pulse 91  Ht  (1.676 m)  Wt 172 lb (78.019 kg)  BMI 27.77 kg/m2 , BMI Body mass index is 27.77 kg/(m^2). GENERAL:  Well appearing HEENT:  Pupils equal round and reactive, fundi not visualized, oral mucosa unremarkable NECK:  No jugular venous distention, waveform within normal limits, carotid upstroke brisk and symmetric, no bruits, no thyromegaly LYMPHATICS:  No cervical adenopathy LUNGS:  Clear to auscultation bilaterally HEART:  RRR.  PMI not displaced or sustained,S1 and S2 within normal limits, no S3, no S4, no clicks, no rubs, no murmurs ABD:  Flat, positive bowel sounds normal in frequency in pitch, no bruits, no rebound, no guarding, no midline pulsatile mass, no hepatomegaly, no splenomegaly EXT:  2 plus pulses throughout, no edema, no cyanosis no clubbing SKIN:  No rashes no nodules NEURO:  Cranial nerves II through XII grossly intact, motor grossly intact throughout PSYCH:  Cognitively intact, oriented to person place and time   EKG:  EKG is not ordered  today. The ekg ordered 07/20/15 demsinus rhythm. Rate 82 beats per minute.  ETT 09/28/15:  Negative for ischemia.  She achieved 9.4 METS on the Bruce protocol.    Recent Labs: 07/20/2015: ALT 7; BUN 8; Creat 0.45*; Hemoglobin 12.3; Potassium 4.1; Sodium 135    Lipid Panel    Component Value Date/Time   CHOL 325* 07/20/2015 1048   TRIG 89 07/20/2015 1048   HDL 53 07/20/2015 1048   CHOLHDL 6.1* 07/20/2015 1048   VLDL 18 07/20/2015 1048   LDLCALC 254* 07/20/2015 1048      Wt Readings from Last 3 Encounters:  10/04/15 172 lb (78.019 kg)  08/17/15 171 lb 3.2 oz (77.656 kg)  07/23/15 173 lb (78.472 kg)      ASSESSMENT AND PLAN:  # Chest pain: Symptoms have resolved. Chest chest was negative for ischemia.  # Familial Hyperlipidemia: Rachel [name]'s LDL was 254, which indicates that she likely has familial hyperlipidemia. She does not have tendon xanthomas or corneal arcus.Her 271 year old child and 39-year-old niece also have hyperlipidemia. She was appropriately started on atorvastatin. Her goal would be an LDL less than 100. If she is unable to obtain this on a statin she should be considered for PCSK-9 inhibitor.   # Orthostatic hypotension: BP improved with increased fluid and salt intake.  She has florinef to take prn.   Current medicines are reviewed at length with the patient today.  The patient does not have concerns regarding medicines.  The following changes have been made:  None   Labs/ tests ordered today include:   No orders of the defined types were placed in this encounter.     Disposition:   FU with Terance Pomplun C. Duke Salviaandolph, MD, [facility] in 1 year.    This note was written with the assistance of speech recognition software.  Please excuse any transcriptional errors.  Signed, Kenton Fortin C. Duke Salviaandolph, MD, [facility]  10/04/2015 5:34 PM    [facility]

## 2015-10-04 NOTE — Patient Instructions (Addendum)
Please keep your scheduled follow up with your PCP to repeat your lipids.  If your LDL is not <100, then you should probably increase atorvastatin (Lipitor) to 80 mg daily.  If that does not work, please return to us so that you can start either Repatha or Praluent.    Medication Instructions:  Your physician recommends that you continue on your current medications as directed. Please refer to the Current Medication list given to you today.  Labwork: none  Testing/Procedures: none  Follow-Up: Your physician wants you to follow-up in: 1 year ov You will receive a reminder letter in the mail two months in advance. If you don't receive a letter, please call our office to schedule the follow-up appointment.  If you need a refill on your cardiac medications before your next appointment, please call your pharmacy.

## 2015-11-01 ENCOUNTER — Encounter: Payer: Self-pay | Admitting: Physician Assistant

## 2015-11-01 ENCOUNTER — Ambulatory Visit (INDEPENDENT_AMBULATORY_CARE_PROVIDER_SITE_OTHER): Payer: 59 | Admitting: Physician Assistant

## 2015-11-01 VITALS — BP 110/72 | HR 88 | Temp 98.0°F | Resp 18 | Ht 66.0 in | Wt 172.0 lb

## 2015-11-01 DIAGNOSIS — Z130 Encounter for screening for diseases of the blood and blood-forming organs and certain disorders involving the immune mechanism: Secondary | ICD-10-CM | POA: Diagnosis not present

## 2015-11-01 DIAGNOSIS — E785 Hyperlipidemia, unspecified: Secondary | ICD-10-CM | POA: Diagnosis not present

## 2015-11-01 DIAGNOSIS — G4700 Insomnia, unspecified: Secondary | ICD-10-CM

## 2015-11-01 DIAGNOSIS — Z124 Encounter for screening for malignant neoplasm of cervix: Secondary | ICD-10-CM

## 2015-11-01 DIAGNOSIS — Z13220 Encounter for screening for lipoid disorders: Secondary | ICD-10-CM

## 2015-11-01 DIAGNOSIS — Z13228 Encounter for screening for other metabolic disorders: Secondary | ICD-10-CM | POA: Diagnosis not present

## 2015-11-01 DIAGNOSIS — Z0000 Encounter for general adult medical examination without abnormal findings: Secondary | ICD-10-CM | POA: Diagnosis not present

## 2015-11-01 DIAGNOSIS — E663 Overweight: Secondary | ICD-10-CM | POA: Diagnosis not present

## 2015-11-01 DIAGNOSIS — Z1329 Encounter for screening for other suspected endocrine disorder: Secondary | ICD-10-CM | POA: Diagnosis not present

## 2015-11-01 DIAGNOSIS — Z139 Encounter for screening, unspecified: Secondary | ICD-10-CM | POA: Diagnosis not present

## 2015-11-01 DIAGNOSIS — Z114 Encounter for screening for human immunodeficiency virus [HIV]: Secondary | ICD-10-CM | POA: Diagnosis not present

## 2015-11-01 DIAGNOSIS — Z1389 Encounter for screening for other disorder: Secondary | ICD-10-CM

## 2015-11-01 DIAGNOSIS — Z6825 Body mass index (BMI) 25.0-25.9, adult: Secondary | ICD-10-CM | POA: Insufficient documentation

## 2015-11-01 LAB — POC MICROSCOPIC URINALYSIS (UMFC): MUCUS RE: ABSENT

## 2015-11-01 LAB — POCT URINALYSIS DIP (MANUAL ENTRY)
BILIRUBIN UA: NEGATIVE
GLUCOSE UA: NEGATIVE
Ketones, POC UA: NEGATIVE
LEUKOCYTES UA: NEGATIVE
Nitrite, UA: NEGATIVE
Protein Ur, POC: NEGATIVE
Spec Grav, UA: 1.025
Urobilinogen, UA: 0.2
pH, UA: 6

## 2015-11-01 MED ORDER — TRAZODONE HCL 50 MG PO TABS: 25.0000 mg | ORAL_TABLET | Freq: Every evening | ORAL | 3 refills | Status: DC | PRN

## 2015-11-01 MED FILL — traZODone HCL 50 MG TABS: 50 | 30 days supply | Qty: 30 | Fill #0

## 2015-11-01 NOTE — Progress Notes (Signed)
Patient ID: Rachel [name], female    DOB: [birth year], 40 y.o.   MRN: [id number]  PCP: Leanor Rubenstein, MD  Chief Complaint  Patient presents with  . Annual Exam    Subjective:   HPI: Presents for Annual Wellness Visit  Cervical Cancer Screening: today Breast Cancer Screening: annual CBE, monthly SBE Colorectal Cancer Screening: not yet a candidate Bone Density Testing: not yet a candidate HIV Screening: not yet STI Screening: very low risk Seasonal Influenza Vaccination: annually at work Td/Tdap Vaccination: 2015 Pneumococcal Vaccination: not yet a candidate Zoster Vaccination: not yet a candidate Frequency of Dental evaluation: Q6 months Frequency of Eye evaluation: annually, was due in July, but had to reschedule  In general, she is feeling well.  She has had no further episodes of CP, but her lipids were noted to be elevated, and she needs a recheck. She is NOT fasting today. She was started on Florinef, which she is tolerating. Has not been successful at losing weight, though she is trying to modify her eating habits. She doesn't have much time to exercise. She has a 39 year-old son with spina bifida, who requires a considerable amount of her attention when she gets home from work. She briefly was getting in some walking with her 48 year-old daughter, but not recently. Difficulty falling and staying asleep. Has a prescription for Halcion, which works, but causes her to feel tired in the morning.    Patient Active Problem List   Diagnosis Date Noted  . Overweight (BMI 25.0-29.9) 11/01/2015  . Insomnia 11/01/2015  . Orthostatic hypotension 08/17/2015  . 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 Atypical chest pain 08/17/2015  . Hyperlipidemia 07/24/2015    Past Medical History:  Diagnosis Date  . Atypical chest pain 08/17/2015  . 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 Orthostatic hypotension 08/17/2015  . PUD (peptic ulcer disease)      Prior to Admission medications     Medication Sig Start Date End Date Taking? Authorizing Provider  atorvastatin (LIPITOR) 40 MG tablet Take 1 tablet (40 mg total) by mouth daily. 07/23/15  Yes Tonye Pearson, MD  fludrocortisone (FLORINEF) 0.1 MG tablet Take 1 tablet (0.1 mg total) by mouth daily as needed (FOR LOW BLOOD PRESSURE). 08/17/15  Yes Chilton Si, MD  Multiple Vitamin (MULTIVITAMIN WITH MINERALS) TABS tablet Take 1 tablet by mouth daily.   Yes Historical Provider, MD  triazolam (HALCION) 0.25 MG tablet Take 1 tablet (0.25 mg total) by mouth at bedtime as needed for sleep. When wakes early 07/23/15  Yes Tonye Pearson, MD    No Known Allergies  Past Surgical History:  Procedure Laterality Date  . CESAREAN SECTION      Family History  Problem Relation Age of Onset  . Spina bifida Son   . Hyperlipidemia Daughter   . Hyperlipidemia Mother   . Heart disease Sister   . Hyperlipidemia Sister   . Hyperlipidemia Other     Social History   Social History  . Marital status: Married    Spouse name: N/A  . Number of children: 2  . Years of education: N/A   Occupational History  . Lead RN at [employer]    Social History Main Topics  . Smoking status: Never Smoker  . Smokeless tobacco: Never Used  . Alcohol use No  . Drug use: No  . Sexual activity: Yes    Birth control/ protection: Condom   Other Topics Concern  . None   Social History Narrative  Lives with her husband and their 2 children   Her son is wheelchair bound due to spina bifida       Review of Systems Constitutional: Negative for appetite change, fatigue, fever and unexpected weight change.  HENT: Negative.   Eyes: Positive for visual disturbance (Feels like it has gotten worse).  Respiratory: Negative for chest tightness, shortness of breath and wheezing.   Cardiovascular: Positive for palpitations (with low BP, takes Florinef.) and leg swelling (Improves with elevation). Negative for chest pain.   Gastrointestinal: Positive for constipation (Trying miralax ). Negative for abdominal pain, blood in stool, diarrhea, nausea and vomiting.  Endocrine: Negative for cold intolerance, heat intolerance, polydipsia and polyuria.  Genitourinary: Positive for menstrual problem (Long cycles, heavy at the end). Negative for difficulty urinating, dysuria, pelvic pain, vaginal bleeding and vaginal discharge.  Musculoskeletal: Positive for back pain. Negative for arthralgias, gait problem and myalgias.  Skin: Negative for rash and wound.  Neurological: Positive for light-headedness (with Low BP). Negative for dizziness, syncope, numbness and headaches.  Hematological: Does not bruise/bleed easily.  Psychiatric/Behavioral: Positive for sleep disturbance (Has trouble falling asleep and staying asleep. Halcion makes her groggy in the AM.). Negative for dysphoric mood. The patient is not nervous/anxious.       Objective:  Physical Exam  Constitutional: She is oriented to person, place, and time. Vital signs are normal. She appears well-developed and well-nourished. She is active and cooperative. No distress.  BP 110/72 (BP Location: Right Arm, Patient Position: Sitting, Cuff Size: Small)   Pulse 88   Temp 98 F (36.7 C) (Oral)   Resp 18   Ht 5\' 6"  (1.676 m)   Wt 172 lb (78 kg)   LMP 10/03/2015   SpO2 99%   BMI 27.76 kg/m    HENT:  Head: Normocephalic and atraumatic.  Right Ear: Hearing, tympanic membrane, external ear and ear canal normal. No foreign bodies.  Left Ear: Hearing, tympanic membrane, external ear and ear canal normal. No foreign bodies.  Nose: Nose normal.  Mouth/Throat: Uvula is midline, oropharynx is clear and moist and mucous membranes are normal. No oral lesions. Normal dentition. No dental abscesses or uvula swelling. No oropharyngeal exudate.  Eyes: Conjunctivae, EOM and lids are normal. Pupils are equal, round, and reactive to light. Right eye exhibits no discharge. Left eye  exhibits no discharge. No scleral icterus.  Fundoscopic exam:      The right eye shows no arteriolar narrowing, no AV nicking, no exudate, no hemorrhage and no papilledema.       The left eye shows no arteriolar narrowing, no AV nicking, no exudate, no hemorrhage and no papilledema.  Neck: Trachea normal, normal range of motion and full passive range of motion without pain. Neck supple. No spinous process tenderness and no muscular tenderness present. No thyroid mass and no thyromegaly present.  Cardiovascular: Normal rate, regular rhythm, normal heart sounds, intact distal pulses and normal pulses.   Pulmonary/Chest: Effort normal and breath sounds normal. She exhibits no tenderness and no retraction. Right breast exhibits no inverted nipple, no mass, no nipple discharge, no skin change and no tenderness. Left breast exhibits no inverted nipple, no mass, no nipple discharge, no skin change and no tenderness. Breasts are symmetrical.  Abdominal: Soft. Normal appearance and bowel sounds are normal. She exhibits no distension and no mass. There is no hepatosplenomegaly. There is no tenderness. There is no rigidity, no rebound, no guarding, no CVA tenderness, no tenderness at McBurney's point and negative Murphy's  sign. No hernia. Hernia confirmed negative in the right inguinal area and confirmed negative in the left inguinal area.  Genitourinary: Rectum normal, vagina normal and uterus normal. Rectal exam shows no external hemorrhoid and no fissure. No breast swelling, tenderness, discharge or bleeding. Pelvic exam was performed with patient supine. No labial fusion. There is no rash, tenderness, lesion or injury on the right labia. There is no rash, tenderness, lesion or injury on the left labia. Cervix exhibits no motion tenderness, no discharge and no friability. Right adnexum displays no mass, no tenderness and no fullness. Left adnexum displays no mass, no tenderness and no fullness. No erythema,  tenderness or bleeding in the vagina. No foreign body in the vagina. No signs of injury around the vagina. No vaginal discharge found.  Musculoskeletal: She exhibits no edema or tenderness.       Cervical back: Normal.       Thoracic back: Normal.       Lumbar back: Normal.  Lymphadenopathy:       Head (right side): No tonsillar, no preauricular, no posterior auricular and no occipital adenopathy present.       Head (left side): No tonsillar, no preauricular, no posterior auricular and no occipital adenopathy present.    She has no cervical adenopathy.    She has no axillary adenopathy.       Right: No inguinal and no supraclavicular adenopathy present.       Left: No inguinal and no supraclavicular adenopathy present.  Neurological: She is alert and oriented to person, place, and time. She has normal strength and normal reflexes. No cranial nerve deficit. She exhibits normal muscle tone. Coordination and gait normal.  Skin: Skin is warm, dry and intact. No rash noted. She is not diaphoretic. No cyanosis or erythema. Nails show no clubbing.  Psychiatric: She has a normal mood and affect. Her speech is normal and behavior is normal. Judgment and thought content normal.           Assessment & Plan:  1. Annual physical exam Age appropriate anticipatory guidance provided.   2. Familial hyperlipidemia Await labs. Adjust regimen as indicated by results. - Lipid panel; Future  3. Screening for deficiency anemia - CBC with Differential/Platelet; Future  4. Screening for metabolic disorder - Comprehensive metabolic panel; Future  5. Screening for thyroid disorder - TSH; Future  6. Screening for blood or protein in urine - POCT Microscopic Urinalysis ([facility]) - POCT urinalysis dipstick   7. Screening for cervical cancer If both cytology and HPV are negative, repeat co-testing in 5 years - Pap IG and HPV (high risk) DNA detection  8. Insomnia Trial of trazodone. - traZODone  (DESYREL) 50 MG tablet; Take 0.5-1 tablets (25-50 mg total) by mouth at bedtime as needed for sleep.  Dispense: 30 tablet; Refill: 3  9. Overweight (BMI 25.0-29.9) Continue healthy eating changes. Look for time to exercise, possibly even by pushing her son in his wheelchair on a walk. - Comprehensive metabolic panel; Future - Lipid panel; Future - TSH; Future  10. Screening for HIV (human immunodeficiency virus) - HIV antibody; Future     Fernande Brashelle S. Doreatha Offer, PA-C Physician Assistant-Certified [facility]

## 2015-11-01 NOTE — Patient Instructions (Signed)

## 2015-11-01 NOTE — Progress Notes (Signed)
Subjective:    Patient ID: Rachel [name], female    DOB: 1976/03/05, [age] y.o.   MRN: [id number]  HPI  Patient presents today for annual exam. Doing well overall, but struggling with trying to lose weight. 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 She has been trying to alter her diet to lose weight, but has not been successful.   She wants to have her lipids rechecked, because her cholesterol was high the last time it was checked in May of this year.   She also has some trouble with falling asleep and staying asleep. She has halcion for this, but does not like taking it because it makes her groggy in the morning.  Cervical Cancer Screening: today HIV Screening: ordered Seasonal Influenza Vaccination: annually Td/Tdap Vaccination: 2015 Frequency of Dental evaluation: Q6 months Frequency of Eye evaluation: annually, had to reschedule this year  Review of Systems  Constitutional: Negative for appetite change, fatigue, fever and unexpected weight change.  HENT: Negative.   Eyes: Positive for visual disturbance (Feels like it has gotten worse).  Respiratory: Negative for chest tightness, shortness of breath and wheezing.   Cardiovascular: Positive for palpitations (with low BP, takes Florinef.) and leg swelling (Improves with elevation). Negative for chest pain.  Gastrointestinal: Positive for constipation (Trying miralax ). Negative for abdominal pain, blood in stool, diarrhea, nausea and vomiting.  Endocrine: Negative for cold intolerance, heat intolerance, polydipsia and polyuria.  Genitourinary: Positive for menstrual problem (Long cycles, heavy at the end). Negative for difficulty urinating, dysuria, pelvic pain, vaginal bleeding and vaginal discharge.  Musculoskeletal: Positive for back pain. Negative for arthralgias, gait problem and myalgias.  Skin: Negative for rash and wound.  Neurological: Positive for  light-headedness (with Low BP). Negative for dizziness, syncope, numbness and headaches.  Hematological: Does not bruise/bleed easily.  Psychiatric/Behavioral: Positive for sleep disturbance (Has trouble falling asleep and staying asleep. Halcion makes her groggy in the AM.). Negative for dysphoric mood. The patient is not nervous/anxious.     Past Medical History:  Diagnosis Date  . Atypical chest pain 08/17/2015  . 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 Orthostatic hypotension 08/17/2015  . PUD (peptic ulcer disease)    Family History  Problem Relation Age of Onset  . Spina bifida Son   . Hyperlipidemia Daughter   . Hyperlipidemia Mother   . Heart disease Sister   . Hyperlipidemia Sister   . Hyperlipidemia Other    Social History   Social History  . Marital status: Married    Spouse name: N/A  . Number of children: 2  . Years of education: N/A   Occupational History  . Lead RN at [employer]    Social History Main Topics  . Smoking status: Never Smoker  . Smokeless tobacco: Never Used  . Alcohol use No  . Drug use: No  . Sexual activity: Yes    Birth control/ protection: Condom   Other Topics Concern  . Not on file   Social History Narrative  . No narrative on file    Current Outpatient Prescriptions:  .  atorvastatin (LIPITOR) 40 MG tablet, Take 1 tablet (40 mg total) by mouth daily., Disp: 90 tablet, Rfl: 3 .  fludrocortisone (FLORINEF) 0.1 MG tablet, Take 1 tablet (0.1 mg total) by mouth daily as needed (FOR LOW BLOOD PRESSURE)., Disp: 30 tablet, Rfl: 1 .  Multiple Vitamin (MULTIVITAMIN WITH MINERALS) TABS tablet, Take 1  tablet by mouth daily., Disp: , Rfl:  .  triazolam (HALCION) 0.25 MG tablet, Take 1 tablet (0.25 mg total) by mouth at bedtime as needed for sleep. When wakes early, Disp: 30 tablet, Rfl: 2   No Known Allergies    Objective:   Physical Exam  Constitutional: She is oriented to person, place, and time. She appears well-developed and  well-nourished. She is cooperative. No distress.  Blood pressure 110/72, pulse 88, temperature 98 F (36.7 C), temperature source Oral, resp. rate 18, height 5\' 6"  (1.676 m), weight 172 lb (78 kg), last menstrual period 10/03/2015, SpO2 99 %.  HENT:  Head: Normocephalic and atraumatic.  Right Ear: Tympanic membrane, external ear and ear canal normal.  Left Ear: Tympanic membrane, external ear and ear canal normal.  Nose: Nose normal.  Mouth/Throat: Uvula is midline, oropharynx is clear and moist and mucous membranes are normal.  Eyes: Conjunctivae and lids are normal. No scleral icterus.  Neck: Trachea normal and normal range of motion. Neck supple. No thyroid mass and no thyromegaly present.  Cardiovascular: Normal rate, regular rhythm and normal heart sounds.   Pulses:      Radial pulses are 2+ on the right side, and 2+ on the left side.       Dorsalis pedis pulses are 2+ on the right side, and 2+ on the left side.       Posterior tibial pulses are 2+ on the right side, and 2+ on the left side.  Pulmonary/Chest: Effort normal and breath sounds normal. Right breast exhibits no inverted nipple, no mass, no nipple discharge, no skin change and no tenderness. Left breast exhibits no inverted nipple, no mass, no nipple discharge, no skin change and no tenderness. Breasts are symmetrical.  Abdominal: Soft. Bowel sounds are normal. She exhibits no distension and no mass. There is no hepatosplenomegaly. There is no tenderness.  Genitourinary: Vagina normal and uterus normal. Pelvic exam was performed with patient prone. No labial fusion. There is no rash, tenderness, lesion or injury on the right labia. There is no rash, tenderness, lesion or injury on the left labia. Cervix exhibits no motion tenderness, no discharge and no friability. Right adnexum displays no mass, no tenderness and no fullness. Left adnexum displays no mass, no tenderness and no fullness.  Musculoskeletal: Normal range of motion.        Cervical back: She exhibits no tenderness and no bony tenderness.       Thoracic back: She exhibits no tenderness and no bony tenderness.       Lumbar back: She exhibits no tenderness and no bony tenderness.  Lymphadenopathy:       Head (right side): No submental, no submandibular, no tonsillar, no preauricular, no posterior auricular and no occipital adenopathy present.       Head (left side): No submental, no submandibular, no tonsillar, no preauricular, no posterior auricular and no occipital adenopathy present.    She has no cervical adenopathy.    She has no axillary adenopathy.       Right: No inguinal and no supraclavicular adenopathy present.       Left: No inguinal and no supraclavicular adenopathy present.  Neurological: She is alert and oriented to person, place, and time. She has normal strength. No cranial nerve deficit or sensory deficit.  Reflex Scores:      Bicep reflexes are 2+ on the right side and 2+ on the left side.      Patellar reflexes are 2+ on the  right side and 2+ on the left side.      Achilles reflexes are 1+ on the right side and 1+ on the left side. Skin: Skin is warm, dry and intact. She is not diaphoretic. No cyanosis. No pallor. Nails show no clubbing.  Psychiatric: She has a normal mood and affect. Her speech is normal and behavior is normal. Judgment and thought content normal. Cognition and memory are normal.   Results for orders placed or performed in visit on 11/01/15  POCT Microscopic Urinalysis ([facility])  Result Value Ref Range   WBC,UR,HPF,POC None None WBC/hpf   RBC,UR,HPF,POC None None RBC/hpf   Bacteria Few (A) None, Too numerous to count   Mucus Absent Absent   Epithelial Cells, UR Per Microscopy Few (A) None, Too numerous to count cells/hpf  POCT urinalysis dipstick  Result Value Ref Range   Color, UA yellow yellow   Clarity, UA clear clear   Glucose, UA negative negative   Bilirubin, UA negative negative   Ketones, POC UA negative  negative   Spec Grav, UA 1.025    Blood, UA trace-intact (A) negative   pH, UA 6.0    Protein Ur, POC negative negative   Urobilinogen, UA 0.2    Nitrite, UA Negative Negative   Leukocytes, UA Negative Negative       Assessment & Plan:  1. Annual physical exam - age appropriate anticipatory guidance provided  2. Hyperlipidemia - last lipid panel on 07/20/2015 showed elevated cholesterol and LDL cholesterol  - Lipid panel; Future - continue with current dose of atorvastatin 40 mg PO daily until lipid panel is back  3. Screening for deficiency anemia - CBC with Differential/Platelet; Future  4. Screening for metabolic disorder - Comprehensive metabolic panel; Future  5. Screening for thyroid disorder - TSH; Future  6. Screening for blood or protein in urine - POCT Microscopic Urinalysis ([facility]) - POCT urinalysis dipstick  7. Screening for cervical cancer - Pap IG and HPV (high risk) DNA detection  8. Insomnia - Halcion causes patient to be groggy, discontinuing this medication. - traZODone (DESYREL) 50 MG tablet; Take 0.5-1 tablets (25-50 mg total) by mouth at bedtime as needed for sleep.  Dispense: 30 tablet; Refill: 3  9. Overweight (BMI 25.0-29.9) - Comprehensive metabolic panel; Future - Lipid panel; Future - TSH; Future  10. Screening for HIV (human immunodeficiency virus) - HIV antibody; Future  Patient verbalized agreement and understanding of plan. Will see again in a year or as needed.  Sequoya Hogsett Rayburn PA-S 11/01/15

## 2015-11-02 LAB — PAP IG AND HPV HIGH-RISK: HPV DNA HIGH RISK: NOT DETECTED

## 2015-11-03 ENCOUNTER — Encounter: Payer: Self-pay | Admitting: Physician Assistant

## 2015-11-03 ENCOUNTER — Other Ambulatory Visit: Payer: Self-pay

## 2015-11-03 DIAGNOSIS — E663 Overweight: Secondary | ICD-10-CM

## 2015-11-03 DIAGNOSIS — Z114 Encounter for screening for human immunodeficiency virus [HIV]: Secondary | ICD-10-CM

## 2015-11-03 DIAGNOSIS — Z13228 Encounter for screening for other metabolic disorders: Secondary | ICD-10-CM

## 2015-11-03 DIAGNOSIS — Z1329 Encounter for screening for other suspected endocrine disorder: Secondary | ICD-10-CM

## 2015-11-03 DIAGNOSIS — Z130 Encounter for screening for diseases of the blood and blood-forming organs and certain disorders involving the immune mechanism: Secondary | ICD-10-CM

## 2015-11-03 DIAGNOSIS — Z13220 Encounter for screening for lipoid disorders: Secondary | ICD-10-CM

## 2015-11-04 MED FILL — ATORVASTATIN 40 MG TABLET: 40 | 90 days supply | Qty: 90 | Fill #1

## 2015-11-11 ENCOUNTER — Encounter: Payer: Self-pay | Admitting: Physician Assistant

## 2015-11-11 DIAGNOSIS — E663 Overweight: Secondary | ICD-10-CM | POA: Diagnosis not present

## 2015-11-11 DIAGNOSIS — Z130 Encounter for screening for diseases of the blood and blood-forming organs and certain disorders involving the immune mechanism: Secondary | ICD-10-CM | POA: Diagnosis not present

## 2015-11-11 DIAGNOSIS — Z1329 Encounter for screening for other suspected endocrine disorder: Secondary | ICD-10-CM | POA: Diagnosis not present

## 2015-11-11 DIAGNOSIS — Z13220 Encounter for screening for lipoid disorders: Secondary | ICD-10-CM | POA: Diagnosis not present

## 2015-11-11 DIAGNOSIS — Z114 Encounter for screening for human immunodeficiency virus [HIV]: Secondary | ICD-10-CM | POA: Diagnosis not present

## 2015-11-11 DIAGNOSIS — Z13228 Encounter for screening for other metabolic disorders: Secondary | ICD-10-CM | POA: Diagnosis not present

## 2015-11-11 LAB — BASIC METABOLIC PANEL
BUN: 11 mg/dL (ref 4–21)
Creatinine: 0.5 mg/dL (ref <=1.1)
Potassium: 4.4 mmol/L (ref 3.4–5.3)
Sodium: 138 mmol/L (ref 137–147)

## 2015-11-11 LAB — LIPID PANEL
CHOLESTEROL: 189 mg/dL (ref 0–200)
HDL: 57 mg/dL (ref 35–70)
LDL CALC: 116 mg/dL
Triglycerides: 82 mg/dL (ref 40–160)

## 2015-11-11 LAB — HEPATIC FUNCTION PANEL
ALT: 29 U/L (ref 7–35)
AST: 26 U/L (ref 13–35)
Alkaline Phosphatase: 70 U/L (ref 25–125)
Bilirubin, Direct: 1 mg/dL — AB
Bilirubin, Total: 1 mg/dL

## 2015-11-11 LAB — TSH: TSH: 0.71 u[IU]/mL (ref <=5.90)

## 2015-11-25 ENCOUNTER — Encounter: Payer: Self-pay | Admitting: Physician Assistant

## 2015-12-16 ENCOUNTER — Encounter: Payer: Self-pay | Admitting: Physician Assistant

## 2015-12-16 ENCOUNTER — Encounter: Payer: Self-pay | Admitting: Cardiovascular Disease

## 2015-12-21 NOTE — Telephone Encounter (Signed)
Chelle sent a message to Lab Add on Pool on 9/11 asking for Lab to check w/[facility] about these labs that were drawn at pt's lab but results should be coming to [facility]le. Please track down results from [facility]. Thanks! Chelle, FYI

## 2016-01-18 ENCOUNTER — Ambulatory Visit (INDEPENDENT_AMBULATORY_CARE_PROVIDER_SITE_OTHER): Payer: 59 | Admitting: Family Medicine

## 2016-01-18 ENCOUNTER — Encounter: Payer: Self-pay | Admitting: Physician Assistant

## 2016-01-18 VITALS — BP 100/80 | HR 81 | Temp 98.3°F | Resp 16 | Ht 66.0 in | Wt 178.6 lb

## 2016-01-18 DIAGNOSIS — N938 Other specified abnormal uterine and vaginal bleeding: Secondary | ICD-10-CM | POA: Diagnosis not present

## 2016-01-18 LAB — HCG, QUANTITATIVE, PREGNANCY

## 2016-01-18 NOTE — Patient Instructions (Addendum)
I have ordered a pelvic ultrasound for you and a blood hcg.  Please seek immediate medical attention if your bleeding becomes more brisk, you develop palpitations, chest pain, shortness of breath, dizziness.    Ultra sound 01/25/16 245pm [place] full bladder.  Abnormal Uterine Bleeding Abnormal uterine bleeding can affect women at various stages in life, including teenagers, women in their reproductive years, pregnant women, and women who have reached menopause. Several kinds of uterine bleeding are considered abnormal, including:  Bleeding or spotting between periods.   Bleeding after sexual intercourse.   Bleeding that is heavier or more than normal.   Periods that last longer than usual.  Bleeding after menopause.  Many cases of abnormal uterine bleeding are minor and simple to treat, while others are more serious. Any type of abnormal bleeding should be evaluated by your health care provider. Treatment will depend on the cause of the bleeding. HOME CARE INSTRUCTIONS Monitor your condition for any changes. The following actions may help to alleviate any discomfort you are experiencing:  Avoid the use of tampons and douches as directed by your health care provider.  Change your pads frequently. You should get regular pelvic exams and Pap tests. Keep all follow-up appointments for diagnostic tests as directed by your health care provider.  SEEK MEDICAL CARE IF:   Your bleeding lasts more than 1 week.   You feel dizzy at times.  SEEK IMMEDIATE MEDICAL CARE IF:   You pass out.   You are changing pads every 15 to 30 minutes.   You have abdominal pain.  You have a fever.   You become sweaty or weak.   You are passing large blood clots from the vagina.   You start to feel nauseous and vomit. MAKE SURE YOU:   Understand these instructions.  Will watch your condition.  Will get help right away if you are not doing well or get worse.   This information is  not intended to replace advice given to you by your health care provider. Make sure you discuss any questions you have with your health care provider.   Document Released: 03/05/2005 Document Revised: 03/10/2013 Document Reviewed: 10/02/2012 Elsevier Interactive Patient Education [employer]2016 Elsevier Inc.     IF you received an x-ray today, you will receive an invoice from [facility]. Please contact [facility] at [phone] with questions or concerns regarding your invoice.   IF you received labwork today, you will receive an invoice from [facility]. Please contact [facility] at [phone] with questions or concerns regarding your invoice.   Our billing staff will not be able to assist you with questions regarding bills from these companies.  You will be contacted with the lab results as soon as they are available. The fastest way to get your results is to activate your My Chart account. Instructions are located on the last page of this paperwork. If you have not heard from us regarding the results in 2 weeks, please contact this office.

## 2016-01-18 NOTE — Progress Notes (Signed)
   Subjective: ZO:[id number]:irregular menstrual cycles JWJ:[id number]:Rachel [name] is a 40 y.o. female presenting to clinic today for same day appointment. PCP: Porfirio Oarhelle Jeffery, PA-C Concerns today include:  Irregular Menstrual Cycles Patient reports long, heavy periods.  She notes that her period started 01/02/16.  She reports that this past Monday, she had no period but the following day, she woke up and had a large gush of blood.  She reports that during her heavy days she uses both the tampon and pad and soaks through.  She reports that her menstrual cycles normally last about 8 days, with the last 3 days being very light.  No possibility of pregnancy prior to this menstrual cycle.  Denies palpitations, dizziness, SOB.  Endorses abdominal cramping, which is unusual for her.  Uses condoms for contraception.  No history of thyroid disorder.  Last pap normal, HPV negative.  No STDs.  She reports weight gain since starting Florinef for hypotension.  No family history of uterine or ovarian cancer.  Has a family history of breast cancer in her paternal aunt 68(40s).  Social History Reviewed: non smoker. FamHx and MedHx reviewed.  Please see EMR.  ROS: Per HPI  Objective: Office vital signs reviewed. BP 100/80 (BP Location: Right Arm, Patient Position: Sitting, Cuff Size: Small)   Pulse 81   Temp 98.3 F (36.8 C) (Oral)   Resp 16   Ht 5\' 6"  (1.676 m)   Wt 178 lb 9.6 oz (81 kg)   LMP 01/02/2016 (Exact Date)   SpO2 99%   BMI 28.83 kg/m   Physical Examination:  General: Awake, alert, well nourished, well appearing female.No acute distress HEENT: no conjunctival pallor Cardio: regular rate, +2 DP Pulm: normal WOB on room air GI: soft, non-tender, non-distended, no hepatomegaly, no splenomegaly GU: internal exam declined.  No palpable masses externally. Uterus normal size. Extremities: warm, well perfused, No edema, cyanosis or clubbing  Assessment/ Plan: 40 y.o. female   1. Dysfunctional uterine bleeding.   Historically normal menstrual cycles.  No family history of ovarian, colon or uterine cancers.  Remote paternal aunt with breast cancer in her 2940s.  Condom use failure makes me more concerned that this may have been a miscarriage.  For this reason, will obtain uterine ultrasound and hcg.  DDx also include uterine fibroid (though no palpable masses on exam and uterus feels normal size) vs endocrine axis abnormality (given h/o hypotension and hypoglycemia requiring Florinef).  No symptoms of anemia on exam, so hgb not obtained. - US Transvaginal Non-OB; Future - US Pelvis Complete; Future - hCG, quantitative, pregnancy - Will contact with results. - Strict return precautions reviewed . Raliegh IpAshly M Gottschalk, DO PGY-3, [facility]

## 2016-01-19 ENCOUNTER — Telehealth: Payer: Self-pay | Admitting: Family Medicine

## 2016-01-19 NOTE — Telephone Encounter (Signed)
Discussed negative bHCG with patient.  Josehua Hammar M. Nadine CountsGottschalk, DO PGY-3, [facility]

## 2016-01-20 DIAGNOSIS — Z0100 Encounter for examination of eyes and vision without abnormal findings: Secondary | ICD-10-CM | POA: Diagnosis not present

## 2016-01-23 ENCOUNTER — Ambulatory Visit (HOSPITAL_COMMUNITY)
Admission: RE | Admit: 2016-01-23 | Discharge: 2016-01-23 | Disposition: A | Discharge status: Home / Self Care | Payer: 59 | Source: Ambulatory Visit | Attending: Family Medicine | Admitting: Family Medicine

## 2016-01-23 ENCOUNTER — Ambulatory Visit: Payer: 59

## 2016-01-23 DIAGNOSIS — N83202 Unspecified ovarian cyst, left side: Secondary | ICD-10-CM | POA: Diagnosis not present

## 2016-01-23 DIAGNOSIS — N938 Other specified abnormal uterine and vaginal bleeding: Secondary | ICD-10-CM | POA: Insufficient documentation

## 2016-01-24 ENCOUNTER — Other Ambulatory Visit: Payer: Self-pay | Admitting: Family Medicine

## 2016-01-24 DIAGNOSIS — N938 Other specified abnormal uterine and vaginal bleeding: Secondary | ICD-10-CM

## 2016-01-24 MED ORDER — NORGESTIMATE-ETH ESTRADIOL 0.25-35 MG-MCG PO TABS: 1.0000 | ORAL_TABLET | Freq: Every day | ORAL | 11 refills | Status: DC

## 2016-01-24 NOTE — Progress Notes (Signed)
Discussed ultrasound with patient.  Normal imaging study.  No explanation for bleed.  Patient asks that she have an rx for OCPs sent into pharmacy.  Blood pregnancy test negative.  Bleeding stopped yesterday.  Sprintec rx sent in.  Follow up with PCP as needed.

## 2016-01-25 ENCOUNTER — Ambulatory Visit (HOSPITAL_COMMUNITY): Payer: 59

## 2016-02-13 MED FILL — MONO-LINYAH 28 TABLET: 0.25-35 | 28 days supply | Qty: 28 | Fill #0

## 2016-02-13 MED FILL — ATORVASTATIN 40 MG TABLET: 40 | 90 days supply | Qty: 90 | Fill #2

## 2016-03-05 MED FILL — MONO-LINYAH 28 TABLET: 0.25-35 | 28 days supply | Qty: 28 | Fill #1

## 2016-03-23 ENCOUNTER — Encounter: Payer: Self-pay | Admitting: Physician Assistant

## 2016-03-30 MED FILL — MONO-LINYAH 28 TABLET: 0.25-35 | 84 days supply | Qty: 84 | Fill #2

## 2016-05-09 MED FILL — ONDANSETRON HCL 8 MG TABLET: 8 | 3 days supply | Qty: 10 | Fill #0

## 2016-05-15 ENCOUNTER — Encounter: Payer: Self-pay | Admitting: Physician Assistant

## 2016-06-11 MED FILL — ATORVASTATIN 40 MG TABLET: 40 | 90 days supply | Qty: 90 | Fill #3

## 2016-07-26 ENCOUNTER — Other Ambulatory Visit: Payer: Self-pay | Admitting: Pediatrics

## 2016-07-26 MED ORDER — PENICILLIN V POTASSIUM 500 MG PO TABS: 500.0000 mg | ORAL_TABLET | Freq: Four times a day (QID) | ORAL | 0 refills | Status: DC

## 2016-07-26 MED FILL — PENICILLIN VK 500 MG TABLET: 500 | 5 days supply | Qty: 20 | Fill #0

## 2016-07-26 NOTE — Progress Notes (Signed)
Positive rapid strep.

## 2016-12-07 ENCOUNTER — Encounter: Payer: Self-pay | Admitting: Physician Assistant

## 2016-12-07 ENCOUNTER — Ambulatory Visit (INDEPENDENT_AMBULATORY_CARE_PROVIDER_SITE_OTHER): Payer: 59 | Admitting: Physician Assistant

## 2016-12-07 VITALS — BP 102/70 | HR 79 | Temp 98.0°F | Resp 16 | Ht 66.14 in | Wt 182.0 lb

## 2016-12-07 DIAGNOSIS — G43809 Other migraine, not intractable, without status migrainosus: Secondary | ICD-10-CM

## 2016-12-07 DIAGNOSIS — G4700 Insomnia, unspecified: Secondary | ICD-10-CM | POA: Diagnosis not present

## 2016-12-07 DIAGNOSIS — R7301 Impaired fasting glucose: Secondary | ICD-10-CM | POA: Diagnosis not present

## 2016-12-07 DIAGNOSIS — H539 Unspecified visual disturbance: Secondary | ICD-10-CM | POA: Diagnosis not present

## 2016-12-07 DIAGNOSIS — E785 Hyperlipidemia, unspecified: Secondary | ICD-10-CM | POA: Diagnosis not present

## 2016-12-07 MED ORDER — ATORVASTATIN CALCIUM 40 MG PO TABS: 40.0000 mg | ORAL_TABLET | Freq: Every day | ORAL | 3 refills | Status: DC

## 2016-12-07 MED ORDER — TRAZODONE HCL 50 MG PO TABS: 25.0000 mg | ORAL_TABLET | Freq: Every evening | ORAL | 3 refills | Status: DC | PRN

## 2016-12-07 MED ORDER — GLUCOSE BLOOD VI STRP: ORAL_STRIP | 99 refills | Status: DC

## 2016-12-07 MED ORDER — TOPIRAMATE 50 MG PO TABS
50.0000 mg | ORAL_TABLET | Freq: Every day | ORAL | 1 refills | Status: DC
Start: 2016-12-07 — End: 2017-06-26

## 2016-12-07 MED FILL — ATORVASTATIN 40 MG TABLET: 40 | 90 days supply | Qty: 90 | Fill #0 | Status: TO

## 2016-12-07 MED FILL — TOPIRAMATE 50 MG TABLET: 50 | 30 days supply | Qty: 30 | Fill #0

## 2016-12-07 MED FILL — traZODone HCL 50 MG TABS: 50 | 90 days supply | Qty: 90 | Fill #0

## 2016-12-07 NOTE — Patient Instructions (Addendum)
Try docusate stool softener (you can take 3 twice each day). Eat a snack before bedtime. Check your glucose fasting and 2-3 hours post-prandially.    IF you received an x-ray today, you will receive an invoice from [facility]. Please contact [facility] at [phone] with questions or concerns regarding your invoice.   IF you received labwork today, you will receive an invoice from [place]. Please contact [facility] at [phone] with questions or concerns regarding your invoice.   Our billing staff will not be able to assist you with questions regarding bills from these companies.  You will be contacted with the lab results as soon as they are available. The fastest way to get your results is to activate your My Chart account. Instructions are located on the last page of this paperwork. If you have not heard from [place] regarding the results in 2 weeks, please contact this office.

## 2016-12-07 NOTE — Progress Notes (Signed)
Subjective:    Patient ID: Rachel [name], female    DOB: Mar 19, 1976, 41 y.o.   MRN: [id number]  HPI Increase in blurry vision and dizziness. Patient saw optometrist last week and reports her eyes are normal without evidence of disease. Increase thirst and urination at night. She reports eating smaller more frequent portions during the day. She has tried to increase protein intake. Patient notes she has tried to decrease intake of sweets and carbohydrates. She checked her HgA1C last week at work with value of 6.1. She checks her blood sugar at home when she is symptomatic, values range from high of 134 when she first wakes up in the morning to low of 68 in the afternoon. Breakfast: boiled egg, toast with cream cheese, cup of coffee. Snack: Protein bar Lunch: chicken/meatball/tuna, rice/couscous/toast, vegetable Snack: Yogurt (Activia)  Protein bar Dinner: Cup of milk or boiled egg   She does report constipation which she takes Metamucil tablets occasionally, stays hydrated, and eats Activia. Patient reports trying Miralax in the past, without any benefit. She does use suppositories when she is very constipated. She rarely notes some bright red blood on toilet paper when she strains to make a bowel movement.   She notes migraine headaches every other day. She describes them as stabbing pain in the top of her head, "20 out of 10" on the pain scale at its worst. She states she is not functional when she gets the migraine. Her migraine is precipitated by a "lightening aura" and she will have to go to a dark room.  She takes Excedrin when her migraine is severe, but still has photosensitivity and sensitivity to sound.   Review of Systems  Constitutional: Positive for fatigue and unexpected weight change (gain 6lb over 3months). Negative for appetite change, chills and fever.  HENT: Negative for congestion, ear pain, hearing loss, mouth sores, rhinorrhea, sinus pain, sneezing, sore throat, tinnitus and  trouble swallowing.   Respiratory: Negative for cough and shortness of breath.   Cardiovascular: Positive for leg swelling. Negative for chest pain and palpitations.  Gastrointestinal: Positive for constipation (BM every 3-4 days). Negative for abdominal pain, diarrhea, nausea and vomiting.  Endocrine: Positive for polydipsia and polyuria. Negative for cold intolerance and heat intolerance.  Genitourinary: Negative for difficulty urinating and dysuria.  Musculoskeletal: Positive for arthralgias (knees). Negative for myalgias.  Neurological: Positive for dizziness and headaches. Negative for syncope, weakness and numbness.  Psychiatric/Behavioral: Negative for behavioral problems and dysphoric mood. The patient is not nervous/anxious.    Prior to Admission medications   Medication Sig Start Date End Date Taking? Authorizing Provider  atorvastatin (LIPITOR) 40 MG tablet Take 1 tablet (40 mg total) by mouth daily. 12/07/16  Yes Jeffery, Chelle, PA-C  Multiple Vitamin (MULTIVITAMIN WITH MINERALS) TABS tablet Take 1 tablet by mouth daily.   Yes [provider]  traZODone (DESYREL) 50 MG tablet Take 0.5-1 tablets (25-50 mg total) by mouth at bedtime as needed for sleep. 12/07/16  Yes Jeffery, Chelle, PA-C  glucose blood test strip Use as instructed 12/07/16   Porfirio Oar, PA-C  topiramate (TOPAMAX) 50 MG tablet Take 1 tablet (50 mg total) by mouth at bedtime. 12/07/16   Porfirio Oar, PA-C   No Known Allergies Patient Active Problem List   Diagnosis Date Noted  . Overweight (BMI 25.0-29.9) 11/01/2015  . Insomnia 11/01/2015  . Orthostatic hypotension 08/17/2015  . 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 Atypical chest pain 08/17/2015  . Hyperlipidemia 07/24/2015  Objective:   Physical Exam  Constitutional: She is oriented to person, place, and time. She appears well-developed and well-nourished.  HENT:  Head: Normocephalic.  Eyes: Pupils are equal, round, and reactive to  light.  Cardiovascular: Normal rate, regular rhythm and normal heart sounds.   Pulmonary/Chest: Effort normal and breath sounds normal.  Abdominal: Soft. Bowel sounds are normal. She exhibits no distension and no mass. There is no tenderness. There is no rebound and no guarding.  Neurological: She is alert and oriented to person, place, and time. She has normal reflexes.  Skin: Skin is warm and dry.  Psychiatric: She has a normal mood and affect. Her behavior is normal. Judgment and thought content normal.      Assessment & Plan:  1. Visual changes - Saw optometrist last week 2. Abnormal fasting glucose - glucose blood test strip; Use as instructed  Dispense: 100 each; Refill: prn - Hemoglobin A1c - Comprehensive metabolic panel - Instructed patient to have a bedtime snack to prevent elevation of fasting blood glucose in the morning due to gluconeogenesis. - Check post-prandial blood glucose 2-3 hours after largest meal - Prescribed Accu-check strips for glucometer at home  3. Other migraine without status migrainosus, not intractable - topiramate (TOPAMAX) 50 MG tablet; Take 1 tablet (50 mg total) by mouth at bedtime.  Dispense: 30 tablet; Refill: 1  4. Insomnia, unspecified type - traZODone (DESYREL) 50 MG tablet; Take 0.5-1 tablets (25-50 mg total) by mouth at bedtime as needed for sleep.  Dispense: 90 tablet; Refill: 3  5. Hyperlipidemia, unspecified hyperlipidemia type - atorvastatin (LIPITOR) 40 MG tablet; Take 1 tablet (40 mg total) by mouth daily.  Dispense: 90 tablet; Refill: 3 - Encouraged balanced diet and exercise as permitted   6. Constipation - Recommended trying Docusate sodium to help in addition to continuing Metamucil and hydration.   Respectfully, Gala Romney PA-S 2019

## 2016-12-08 LAB — COMPREHENSIVE METABOLIC PANEL
ALBUMIN: 4.5 g/dL (ref 3.5–5.5)
ALT: 38 IU/L — ABNORMAL HIGH (ref 0–32)
AST: 31 IU/L (ref 0–40)
Albumin/Globulin Ratio: 1.5 (ref 1.2–2.2)
Alkaline Phosphatase: 67 IU/L (ref 39–117)
BILIRUBIN TOTAL: 0.6 mg/dL (ref 0.0–1.2)
BUN / CREAT RATIO: 19 (ref 9–23)
BUN: 9 mg/dL (ref 6–24)
CALCIUM: 9.3 mg/dL (ref 8.7–10.2)
CHLORIDE: 101 mmol/L (ref 96–106)
CO2: 25 mmol/L (ref 20–29)
CREATININE: 0.48 mg/dL — AB (ref 0.57–1.00)
GFR, EST AFRICAN AMERICAN: 142 mL/min/{1.73_m2} (ref >59)
GFR, EST NON AFRICAN AMERICAN: 123 mL/min/{1.73_m2} (ref >59)
GLUCOSE: 95 mg/dL (ref 65–99)
Globulin, Total: 3.1 g/dL (ref 1.5–4.5)
Potassium: 4.1 mmol/L (ref 3.5–5.2)
Sodium: 139 mmol/L (ref 134–144)
TOTAL PROTEIN: 7.6 g/dL (ref 6.0–8.5)

## 2016-12-08 LAB — HEMOGLOBIN A1C
Est. average glucose Bld gHb Est-mCnc: 131 mg/dL
Hgb A1c MFr Bld: 6.2 % — ABNORMAL HIGH (ref 4.8–5.6)

## 2016-12-12 ENCOUNTER — Encounter: Payer: Self-pay | Admitting: Physician Assistant

## 2016-12-12 DIAGNOSIS — R7303 Prediabetes: Secondary | ICD-10-CM | POA: Insufficient documentation

## 2016-12-13 ENCOUNTER — Telehealth: Payer: Self-pay | Admitting: Physician Assistant

## 2016-12-13 DIAGNOSIS — R7301 Impaired fasting glucose: Secondary | ICD-10-CM

## 2016-12-13 MED ORDER — GLUCOSE BLOOD VI STRP: ORAL_STRIP | 99 refills | Status: DC

## 2016-12-13 MED FILL — FREESTYLE LITE TEST STRIP: 90 days supply | Qty: 100 | Fill #0

## 2016-12-13 NOTE — Telephone Encounter (Signed)
Phone message from [facility]. Insurance plan does not cover Accu-Check. Wants to replace with free style test strips - sent fax. No response. Attempted call to [facility] - phones down. I reordered in pt's chart to reflect the change.

## 2016-12-13 NOTE — Telephone Encounter (Signed)
THIS CALL IS FROM [facility] FOR [facility]LE: ON 12/07/16 SHE WROTE A PRESCRIPTION FOR GLUCOSE TEST STRIPS (ACC-CHECK). HER PLAN WITH [facility] THROUGH [facility] IS NOT PAYING FOR IT THIS YEAR. CAN THEY SUBSTITUTE WITH FREE STYLE TEST STRIPS? THEY FAXED THE REQUEST ON Friday BUT HAVE NOT HEARD BACK YET. BEST PHONE [phone] (ELIZABETH'S DIRECT LINE) MBC

## 2016-12-16 NOTE — Progress Notes (Signed)
Patient ID: Rachel [name], female    DOB: Jun 22, 1975, 41 y.o.   MRN: [id number]  PCP: Porfirio Oar, PA-C  Chief Complaint  Patient presents with  . Blurred Vision    for a while pt states   . Fatigue    with thrist     Subjective:   Presents for evaluation of blurred vision and fatigue.  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 She rarely takes time for herself.  Experiencing episodes of blurry vision and increased fatigue. When these episodes occur, she checks her glucose and notes it's highest in the mornings (134) and lowest in the afternoons (68).  She relates increased thirst and frequency of urination at night. Increased dietary protein and changed to smaller, more frequent meals. Reduced sweets and starches.  Breakfast: boiled egg, toast with cream cheese, cup of coffee. Snack: Protein bar Lunch: chicken/meatball/tuna, rice/couscous/toast, vegetable Snack: Yogurt (Activia)  Protein bar Dinner: Cup of milk or boiled egg   She did see her eye specialist last week and reports there were no abnormal findings. She also checked her A1C at work last week and it was 6.1%.  Additional issues: -Constipation is chronic. She uses Metamucil sometimes, drinks a lot of water and eats Activia. Previously tried Miralax without benefit. Sometimes has to use a suppository. Sometimes strains, associated with BRBPR on the TP. No melena or hematochezia. -Migraine headaches, every other day. "Stabbing" pain, top of the head. "20"/10 pain. Preceded by a lightening aura." Is not able to function when these occur, has to lie down in a darkened room. Excedrin helps the pain, but not photo-/phonophobia.   Review of Systems Constitutional: Positive for fatigue and unexpected weight change (gain 6lb over 3months). Negative for appetite change, chills and fever.  HENT: Negative for congestion, ear pain, hearing  loss, mouth sores, rhinorrhea, sinus pain, sneezing, sore throat, tinnitus and trouble swallowing.   Respiratory: Negative for cough and shortness of breath.   Cardiovascular: Positive for leg swelling. Negative for chest pain and palpitations.  Gastrointestinal: Positive for constipation (BM every 3-4 days). Negative for abdominal pain, diarrhea, nausea and vomiting.  Endocrine: Positive for polydipsia and polyuria. Negative for cold intolerance and heat intolerance.  Genitourinary: Negative for difficulty urinating and dysuria.  Musculoskeletal: Positive for arthralgias (knees). Negative for myalgias.  Neurological: Positive for dizziness and headaches. Negative for syncope, weakness and numbness.  Psychiatric/Behavioral: Negative for behavioral problems and dysphoric mood. The patient is not nervous/anxious.      Patient Active Problem List   Diagnosis Date Noted  . Prediabetes 12/12/2016  . Overweight (BMI 25.0-29.9) 11/01/2015  . Insomnia 11/01/2015  . Orthostatic hypotension 08/17/2015  . 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 Atypical chest pain 08/17/2015  . Hyperlipidemia 07/24/2015     Prior to Admission medications   Medication Sig Start Date End Date Taking? Authorizing Provider  atorvastatin (LIPITOR) 40 MG tablet Take 1 tablet (40 mg total) by mouth daily. 12/07/16  Yes Corky Blumstein, PA-C  Multiple Vitamin (MULTIVITAMIN WITH MINERALS) TABS tablet Take 1 tablet by mouth daily.   Yes [provider]  traZODone (DESYREL) 50 MG tablet Take 0.5-1 tablets (25-50 mg total) by mouth at bedtime as needed for sleep. 12/07/16  Yes Calyn Sivils, PA-C  glucose blood test strip Use as instructed 12/13/16   Porfirio Oar, PA-C  topiramate (TOPAMAX) 50 MG tablet Take 1 tablet (50 mg total)  by mouth at bedtime. 12/07/16   Porfirio Oar, PA-C     No Known Allergies     Objective:  Physical Exam  Constitutional: She is oriented to person, place, and time. She appears  well-developed and well-nourished. She is active and cooperative. No distress.  BP 102/70   Pulse 79   Temp 98 F (36.7 C) (Oral)   Resp 16   Ht 5' 6.14" (1.68 m)   Wt 182 lb (82.6 kg)   LMP 11/26/2016   SpO2 98%   BMI 29.25 kg/m   HENT:  Head: Normocephalic and atraumatic.  Right Ear: Hearing normal.  Left Ear: Hearing normal.  Eyes: Conjunctivae are normal. No scleral icterus.  Neck: Normal range of motion. Neck supple. No thyromegaly present.  Cardiovascular: Normal rate, regular rhythm and normal heart sounds.   Pulses:      Radial pulses are 2+ on the right side, and 2+ on the left side.  Pulmonary/Chest: Effort normal and breath sounds normal.  Lymphadenopathy:       Head (right side): No tonsillar, no preauricular, no posterior auricular and no occipital adenopathy present.       Head (left side): No tonsillar, no preauricular, no posterior auricular and no occipital adenopathy present.    She has no cervical adenopathy.       Right: No supraclavicular adenopathy present.       Left: No supraclavicular adenopathy present.  Neurological: She is alert and oriented to person, place, and time. She has normal strength. No cranial nerve deficit or sensory deficit.  Reflex Scores:      Bicep reflexes are 2+ on the right side and 2+ on the left side.      Patellar reflexes are 2+ on the right side and 2+ on the left side.      Achilles reflexes are 2+ on the right side and 2+ on the left side. Skin: Skin is warm, dry and intact. No rash noted. No cyanosis or erythema. Nails show no clubbing.  Psychiatric: She has a normal mood and affect. Her speech is normal and behavior is normal.           Assessment & Plan:   Problem List Items Addressed This Visit    Hyperlipidemia   Relevant Medications   atorvastatin (LIPITOR) 40 MG tablet   Insomnia   Relevant Medications   traZODone (DESYREL) 50 MG tablet    Other Visit Diagnoses    Visual changes    -  Primary   Normal  eye specialist exam is reassuring. Await lab results.    Abnormal fasting glucose       Await labs.   Relevant Orders   Hemoglobin A1c (Completed)   Comprehensive metabolic panel (Completed)   Other migraine without status migrainosus, not intractable       Trial of trazodone. Suspect stress is a large factor in her symptoms.   Relevant Medications   topiramate (TOPAMAX) 50 MG tablet   atorvastatin (LIPITOR) 40 MG tablet   traZODone (DESYREL) 50 MG tablet       Return in about 3 weeks (around 12/28/2016) for re-evaluation of headache, glucose, vision.   Fernande Bras, PA-C Primary Care at [facility]

## 2016-12-18 ENCOUNTER — Telehealth: Payer: Self-pay

## 2016-12-18 MED ORDER — GLUCOSE BLOOD VI STRP: ORAL_STRIP | 4 refills | Status: DC

## 2016-12-18 MED ORDER — FREESTYLE SYSTEM KIT: 1.0000 | PACK | Freq: Every day | 0 refills | Status: DC

## 2016-12-18 MED FILL — FREESTYLE FREEDOM LITE METE: W/DEVICE | 30 days supply | Qty: 1 | Fill #0

## 2016-12-18 NOTE — Telephone Encounter (Signed)
Received notice from pharmacy stating that patient insurance will only cover Freestyle Lite meter and testing supplies. Per provider verbal order, medication sen to pharmacy. Call placed to patient to make her aware of the change./ S.Shalena Ezzell,CMA

## 2016-12-28 ENCOUNTER — Telehealth: Payer: Self-pay | Admitting: Physician Assistant

## 2016-12-28 ENCOUNTER — Ambulatory Visit (INDEPENDENT_AMBULATORY_CARE_PROVIDER_SITE_OTHER): Payer: 59 | Admitting: Physician Assistant

## 2016-12-28 ENCOUNTER — Encounter: Payer: Self-pay | Admitting: Physician Assistant

## 2016-12-28 DIAGNOSIS — G4489 Other headache syndrome: Secondary | ICD-10-CM | POA: Diagnosis not present

## 2016-12-28 DIAGNOSIS — IMO0002 Reserved for concepts with insufficient information to code with codable children: Secondary | ICD-10-CM | POA: Insufficient documentation

## 2016-12-28 DIAGNOSIS — R7303 Prediabetes: Secondary | ICD-10-CM | POA: Diagnosis not present

## 2016-12-28 DIAGNOSIS — G43709 Chronic migraine without aura, not intractable, without status migrainosus: Secondary | ICD-10-CM | POA: Insufficient documentation

## 2016-12-28 MED ORDER — LIDOCAINE HCL 4 % EX SOLN: CUTANEOUS | 0 refills | Status: DC

## 2016-12-28 NOTE — Progress Notes (Signed)
Patient ID: Rachel [name], female    DOB: October 19, 1975, 41 y.o.   MRN: [id number]  PCP: Harrison Mons, PA-C  Chief Complaint  Patient presents with  . Headache  . Vision  . Diabetes  . Follow-up    Pt states she is still getting headache and blurry vision, Pt states that when she started the Topamax it got a little better.    Subjective:   Presents for evaluation of headaches, and re-evaluation of vision changes and glucose readings that she worries is diabetes.  Since starting topiramate 12/07/2016, her headaches have improved in frequency from constant to 3x/week. When she does have a headache, it is present upon awakening and lasts all day. The worst pain is on the top of the head, like it's pushing down, and is rated 10/10. 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 Ibuprofen helps reduce the pain to 6-7/10.  She has prediabetes (A1C 9/21 was 6.2%) and she has been working on eating small protein-containing snacks throughout the day.  Lifestyle changes, including eating choices, exercise and sleep, are very difficult for her, given her responsibilities at home and at work. She feels that she has no time to do the self-care recommended to address her headaches and prediabetes.    Review of Systems As above.    Patient Active Problem List   Diagnosis Date Noted  . Headache 12/28/2016  . Prediabetes 12/12/2016  . Overweight (BMI 25.0-29.9) 11/01/2015  . Insomnia 11/01/2015  . Orthostatic hypotension 08/17/2015  . 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 Atypical chest pain 08/17/2015  . Hyperlipidemia 07/24/2015     Prior to Admission medications   Medication Sig Start Date End Date Taking? Authorizing Provider  atorvastatin (LIPITOR) 40 MG tablet  Take 1 tablet (40 mg total) by mouth daily. 12/07/16  Yes Rory Xiang, PA-C  glucose blood (FREESTYLE LITE) test strip Use once daily to check blood sugar 12/18/16  Yes Dabid Godown, PA-C  glucose monitoring kit (FREESTYLE) monitoring kit 1 each by Does not apply route daily. 12/18/16  Yes Maddon Horton, PA-C  Multiple Vitamin (MULTIVITAMIN WITH MINERALS) TABS tablet Take 1 tablet by mouth daily.   Yes [provider]  topiramate (TOPAMAX) 50 MG tablet Take 1 tablet (50 mg total) by mouth at bedtime. 12/07/16  Yes Quantarius Genrich, PA-C  traZODone (DESYREL) 50 MG tablet Take 0.5-1 tablets (25-50 mg total) by mouth at bedtime as needed for sleep. 12/07/16  Yes Agueda Houpt, PA-C     No Known Allergies     Objective:  Physical Exam  Constitutional: She is oriented to person, place, and time. She appears well-developed and well-nourished. She is active and cooperative. No distress.  BP 98/64 (BP Location: Left Arm, Patient Position: Sitting, Cuff Size: Normal)   Pulse 94   Temp 98.1 F (36.7 C) (Oral)   Resp 18   Ht 5' 6.14" (1.68 m)   Wt 183 lb 6.4 oz (83.2 kg)   LMP 12/23/2016   SpO2 98%   BMI 29.48 kg/m   HENT:  Head: Normocephalic and atraumatic.  Right Ear: Hearing normal.  Left Ear: Hearing normal.  Eyes: Conjunctivae are normal. No scleral icterus.  Neck: Normal range of motion. Neck supple.  No thyromegaly present.  Cardiovascular: Normal rate, regular rhythm and normal heart sounds.   Pulses:      Radial pulses are 2+ on the right side, and 2+ on the left side.  Pulmonary/Chest: Effort normal and breath sounds normal.  Lymphadenopathy:       Head (right side): No tonsillar, no preauricular, no posterior auricular and no occipital adenopathy present.       Head (left side): No tonsillar, no preauricular, no posterior auricular and no occipital adenopathy present.    She has no cervical adenopathy.       Right: No supraclavicular adenopathy present.        Left: No supraclavicular adenopathy present.  Neurological: She is alert and oriented to person, place, and time. She has normal strength. No cranial nerve deficit or sensory deficit.  Skin: Skin is warm, dry and intact. No rash noted. No cyanosis or erythema. Nails show no clubbing.  Psychiatric: She has a normal mood and affect. Her speech is normal and behavior is normal.           Assessment & Plan:   Problem List Items Addressed This Visit    Prediabetes    Continue efforts to make healthy eating choices, especially planning for protein-containing snacks.      Headache    Improved, but still uncontrolled. INCREASE topiramate from 50 mg to 100 mg at HS. Stress (home and work) and inadequate restorative sleep are likely the primary drivers of her headaches, but she does not feel that she can make changes in those areas. Once frequency of headaches are not more than one weekly, plan to start triptan for acute treatment. Trial of lidocaine nasal drops.      Relevant Medications   lidocaine (XYLOCAINE) 4 % external solution       Return in about 10 weeks (around 03/08/2017) for re-evaluation of glucose and headaches.   Fara Chute, PA-C Primary Care at [facility]

## 2016-12-28 NOTE — Patient Instructions (Addendum)
Try INCREASING the topiramate to 100 mg. (DOUBLE CHECK THE BOTTLE) We can also consider switching from the immediate release to the extended release, which may allow [place] to push the dose up and minimize any side effects.    IF you received an x-ray today, you will receive an invoice from [facility]. Please contact [facility] at [phone] with questions or concerns regarding your invoice.   IF you received labwork today, you will receive an invoice from [facility]. Please contact [facility] at [phone] with questions or concerns regarding your invoice.   Our billing staff will not be able to assist you with questions regarding bills from these companies.  You will be contacted with the lab results as soon as they are available. The fastest way to get your results is to activate your My Chart account. Instructions are located on the last page of this paperwork. If you have not heard from [place] regarding the results in 2 weeks, please contact this office.

## 2016-12-28 NOTE — Telephone Encounter (Signed)
[facility] outpatient called to inquire about the use of litocane. Pharmacist Zannie Cove) had never seen the medicine prescribed used in the way directed.   Pharmacy requested to be called at [phone].

## 2017-01-01 NOTE — Telephone Encounter (Signed)
Lidocaine 4% can be used intranasally for treatment of acute headache. Patient has been instructed on instilling it in her nose.

## 2017-01-01 NOTE — Telephone Encounter (Signed)
Please clarify medication instructions?

## 2017-01-05 NOTE — Assessment & Plan Note (Addendum)
Improved, but still uncontrolled. INCREASE topiramate from 50 mg to 100 mg at HS. Stress (home and work) and inadequate restorative sleep are likely the primary drivers of her headaches, but she does not feel that she can make changes in those areas. Once frequency of headaches are not more than one weekly, plan to start triptan for acute treatment. Trial of lidocaine nasal drops.

## 2017-01-05 NOTE — Assessment & Plan Note (Signed)
Continue efforts to make healthy eating choices, especially planning for protein-containing snacks.

## 2017-03-07 ENCOUNTER — Encounter: Payer: Self-pay | Admitting: Physician Assistant

## 2017-03-08 ENCOUNTER — Ambulatory Visit: Payer: 59 | Admitting: Physician Assistant

## 2017-05-06 ENCOUNTER — Encounter: Payer: Self-pay | Admitting: Physician Assistant

## 2017-05-06 ENCOUNTER — Ambulatory Visit: Payer: Self-pay | Admitting: *Deleted

## 2017-05-06 ENCOUNTER — Ambulatory Visit (INDEPENDENT_AMBULATORY_CARE_PROVIDER_SITE_OTHER): Payer: No Typology Code available for payment source | Admitting: Physician Assistant

## 2017-05-06 VITALS — BP 124/88 | HR 92 | Temp 98.9°F | Resp 16 | Ht 66.0 in | Wt 183.0 lb

## 2017-05-06 DIAGNOSIS — R519 Headache, unspecified: Secondary | ICD-10-CM

## 2017-05-06 DIAGNOSIS — R51 Headache: Secondary | ICD-10-CM

## 2017-05-06 LAB — POCT CBC
GRANULOCYTE PERCENT: 54.2 % (ref 37–80)
HCT, POC: 38.2 % (ref 37.7–47.9)
Hemoglobin: 12.6 g/dL (ref 12.2–16.2)
Lymph, poc: 2.9 (ref 0.6–3.4)
MCH, POC: 26.4 pg — AB (ref 27–31.2)
MCHC: 33.1 g/dL (ref 31.8–35.4)
MCV: 79.9 fL — AB (ref 80–97)
MID (CBC): 0.3 (ref 0–0.9)
MPV: 8.4 fL (ref 0–99.8)
PLATELET COUNT, POC: 346 10*3/uL (ref 142–424)
POC Granulocyte: 3.7 (ref 2–6.9)
POC LYMPH %: 41.9 % (ref 10–50)
POC MID %: 3.9 %M (ref 0–12)
RBC: 4.78 M/uL (ref 4.04–5.48)
RDW, POC: 14.8 %
WBC: 6.9 10*3/uL (ref 4.6–10.2)

## 2017-05-06 LAB — GLUCOSE, POCT (MANUAL RESULT ENTRY): POC Glucose: 70 mg/dl (ref 70–99)

## 2017-05-06 MED ORDER — FLUTICASONE PROPIONATE 50 MCG/ACT NA SUSP: 2.0000 | Freq: Every day | NASAL | 12 refills | Status: DC

## 2017-05-06 MED ORDER — IBUPROFEN 600 MG PO TABS: 600.0000 mg | ORAL_TABLET | Freq: Three times a day (TID) | ORAL | 0 refills | Status: DC | PRN

## 2017-05-06 MED ORDER — TIZANIDINE HCL 4 MG PO TABS: 4.0000 mg | ORAL_TABLET | Freq: Four times a day (QID) | ORAL | 0 refills | Status: DC | PRN

## 2017-05-06 NOTE — Progress Notes (Signed)
PRIMARY CARE AT [facility] [address] [place] [address] [phone]  Date:  05/06/2017   Name:  Rachel [name]   DOB:  13-Jul-1975   MRN:  [id number]  PCP:  Harrison Mons, PA-C    History of Present Illness:  Rachel [name] is a 42 y.o. female patient who presents to PCP with  Chief Complaint  Patient presents with  . Headache    pt states she has sharp pain headache/ x 2wk. pt states she has puffness on top of her head.   . Dizziness      Patient Active Problem List   Diagnosis Date Noted  . Headache 12/28/2016  . Prediabetes 12/12/2016  . Overweight (BMI 25.0-29.9) 11/01/2015  . Insomnia 11/01/2015  . Orthostatic hypotension 08/17/2015  . 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 Atypical chest pain 08/17/2015  . Hyperlipidemia 07/24/2015    Past Medical History:  Diagnosis Date  . Atypical chest pain 08/17/2015  . 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 Orthostatic hypotension 08/17/2015  . PUD (peptic ulcer disease)     Past Surgical History:  Procedure Laterality Date  . CESAREAN SECTION      Social History   Tobacco Use  . Smoking status: Never Smoker  . Smokeless tobacco: Never Used  Substance Use Topics  . Alcohol use: No  . Drug use: No    Family History  Problem Relation Age of Onset  . Spina bifida Son   . Hyperlipidemia Daughter   . Hyperlipidemia Mother   . Heart disease Sister   . Hyperlipidemia Sister   . Hyperlipidemia Other   . Breast cancer Paternal Aunt     No Known Allergies  Medication list has been reviewed and updated.  Current Outpatient Medications on File Prior to Visit  Medication Sig Dispense Refill  . atorvastatin (LIPITOR) 40 MG tablet Take 1 tablet (40 mg total) by mouth daily. 90 tablet 3  . glucose blood (FREESTYLE LITE) test strip Use once daily to check blood sugar 100 each 4  . glucose monitoring kit (FREESTYLE) monitoring kit 1 each by Does not apply route daily. 1 each 0  . Multiple Vitamin (MULTIVITAMIN WITH  MINERALS) TABS tablet Take 1 tablet by mouth daily.    . traZODone (DESYREL) 50 MG tablet Take 0.5-1 tablets (25-50 mg total) by mouth at bedtime as needed for sleep. 90 tablet 3  . lidocaine (XYLOCAINE) 4 % external solution 0.25 mL into each nostril as directed PRN headache (Patient not taking: Reported on 05/06/2017) 50 mL 0  . topiramate (TOPAMAX) 50 MG tablet Take 1 tablet (50 mg total) by mouth at bedtime. (Patient not taking: Reported on 05/06/2017) 30 tablet 1   No current facility-administered medications on file prior to visit.     ROS ROS otherwise unremarkable unless listed above.  Physical Examination: BP 124/88   Pulse 92   Temp 98.9 F (37.2 C) (Oral)   Resp 16   Ht _0  (1.676 m)   Wt 183 lb (83 kg)   LMP 04/28/2017   SpO2 96%   BMI 29.54 kg/m  Ideal Body Weight: Weight in (lb) to have BMI = 25: 154.6  Physical Exam   Assessment and Plan: Rachel [name] is a 42 y.o. female who is here today  There are no diagnoses linked to this encounter.  Ivar Drape, PA-C [facility] 05/06/2017 5:25 PM

## 2017-05-06 NOTE — Telephone Encounter (Signed)
Patient is calling to reports that she is concerned about her heart rate and her migraines. She states she feels that she is weak and feels dizzy at times. She states she has had cardiology work up and she is concerned about her changing symptoms. She states she has a heart rate that is varying at 120 with a headache that is coming and going. She state she feeling like the top of her head is swollen but not painful to touch. Protocol states she needs to be seen at ED- but patient refused and wants to be seen at PCP office. Appointment made and note sent to office. HR 120 BP 110/70 Hgb 12.7 Reason for Disposition . Dizziness, lightheadedness, or weakness  Answer Assessment - Initial Assessment Questions 1. DESCRIPTION: "Please describe your heart rate or heart beat that you are having" (e.g., fast/slow, regular/irregular, skipped or extra beats, "palpitations")     Patient reports her heart rate is normally in the 90's- she states the 120 was sitting.  2. ONSET: "When did it start?" (Minutes, hours or days)      Patient has had a headache- 2 weeks it gets better but comes back- rapid heart rate started Friday- 110 and continued over the weekend on/off and today 3. DURATION: "How long does it last" (e.g., seconds, minutes, hours)     Good period of time- it can last for hours 4. PATTERN "Does it come and go, or has it been constant since it started?"  "Does it get worse with exertion?"   "Are you feeling it now?"     Heart rate gets worse when up and moving- yes- it is present now 5. TAP: "Using your hand, can you tap out what you are feeling on a chair or table in front of you, so that I can hear?" (Note: not all patients can do this)        6. HEART RATE: "Can you tell me your heart rate?" "How many beats in 15 seconds?"  (Note: not all patients can do this)       97 7. RECURRENT SYMPTOM: "Have you ever had this before?" If so, ask: "When was the last time?" and "What happened that time?"   Patient has seen cardiologist for this before 8. CAUSE: "What do you think is causing the palpitations?"     Associated with dizziness, weakness, headaches that is changing and - patient feels that she has swelling on the top of her head- no pain on surface 9. CARDIAC HISTORY: "Do you have any history of heart disease?" (e.g., heart attack, angina, bypass surgery, angioplasty, arrhythmia)      no 10. OTHER SYMPTOMS: "Do you have any other symptoms?" (e.g., dizziness, chest pain, sweating, difficulty breathing)       Dizziness, weakness, headaches, will fill like the office is spinning around her 11. PREGNANCY: "Is there any chance you are pregnant?" "When was your last menstrual period?"       No- LMP last week 2/10  Protocols used: HEART RATE AND HEARTBEAT QUESTIONS-A-AH

## 2017-05-06 NOTE — Patient Instructions (Addendum)
Ibuprofen will decrease inflammation.  Please take as three times per day at this time for the next 24 hours.  I would like you to take the muscle relaxant as well.  Please note this can be sedating, so do not operate heavy machinery with this medication.   Let's follow up in 1 week.  I will contact you regarding your bloodwork.   General Headache Without Cause A headache is pain or discomfort felt around the head or neck area. There are many causes and types of headaches. In some cases, the cause may not be found. Follow these instructions at home: Managing pain  Take over-the-counter and prescription medicines only as told by your doctor.  Lie down in a dark, quiet room when you have a headache.  If directed, apply ice to the head and neck area: ? Put ice in a plastic bag. ? Place a towel between your skin and the bag. ? Leave the ice on for 20 minutes, 2-3 times per day.  Use a heating pad or hot shower to apply heat to the head and neck area as told by your doctor.  Keep lights dim if bright lights bother you or make your headaches worse. Eating and drinking  Eat meals on a regular schedule.  Lessen how much alcohol you drink.  Lessen how much caffeine you drink, or stop drinking caffeine. General instructions  Keep all follow-up visits as told by your doctor. This is important.  Keep a journal to find out if certain things bring on headaches. For example, write down: ? What you eat and drink. ? How much sleep you get. ? Any change to your diet or medicines.  Relax by getting a massage or doing other relaxing activities.  Lessen stress.  Sit up straight. Do not tighten (tense) your muscles.  Do not use tobacco products. This includes cigarettes, chewing tobacco, or e-cigarettes. If you need help quitting, ask your doctor.  Exercise regularly as told by your doctor.  Get enough sleep. This often means 7-9 hours of sleep. Contact a doctor if:  Your symptoms are  not helped by medicine.  You have a headache that feels different than the other headaches.  You feel sick to your stomach (nauseous) or you throw up (vomit).  You have a fever. Get help right away if:  Your headache becomes really bad.  You keep throwing up.  You have a stiff neck.  You have trouble seeing.  You have trouble speaking.  You have pain in the eye or ear.  Your muscles are weak or you lose muscle control.  You lose your balance or have trouble walking.  You feel like you will pass out (faint) or you pass out.  You have confusion. This information is not intended to replace advice given to you by your health care provider. Make sure you discuss any questions you have with your health care provider. Document Released: 12/13/2007 Document Revised: 08/11/2015 Document Reviewed: 06/28/2014 Elsevier Interactive Patient Education  2018 [employer]    IF you received an x-ray today, you will receive an invoice from [facility]. Please contact [facility] at [phone] with questions or concerns regarding your invoice.   IF you received labwork today, you will receive an invoice from [facility]. Please contact [facility] at [phone] with questions or concerns regarding your invoice.   Our billing staff will not be able to assist you with questions regarding bills from these companies.  You will be contacted with the  lab results as soon as they are available. The fastest way to get your results is to activate your My Chart account. Instructions are located on the last page of this paperwork. If you have not heard from [place] regarding the results in 2 weeks, please contact this office.

## 2017-05-06 NOTE — Progress Notes (Signed)
PRIMARY CARE AT [facility] [address], [place] [address] [phone]  Date:  05/06/2017   Name:  Rachel Rachel [name]   DOB:  [birth year]   MRN:  [id number]  PCP:  Harrison Mons, PA-C    History of Present Illness:  Rachel Rachel [name] is a 41 y.o. female patient who presents to PCP with  Chief Complaint  Patient presents with  . Headache    pt states she has sharp pain headache/ x 2wk. pt states she has puffness on Rachel [name] of her head.   . Dizziness  the head pain is at the Rachel [name] of her head and sometimes at the back of her head.  She feels that she is having electric shocks on Rachel [name] of her head going down.  No vision changes, photophobia.  She does have some phonophobia.  She has no nausea.   She has noted that she is having swelling at the Rachel [name] of her head.  Her hr was around 100-120 She has dizziness intermittently.  She feels that the room is spinning.   Her cycles are normal without heavy bleeding.   No chest pains or sob.   She does note some palpitations.  No numbness or weakness at extremities or facial changes.   She does have a hx of headaches, however she notes that her general headaches are relieved by sound and light reduction, and at her eyes, but not with this   She is not taking the topirimate for more than 1 month, due to side effects    Patient Active Problem List   Diagnosis Date Noted  . Headache 12/28/2016  . Prediabetes 12/12/2016  . Overweight (BMI 25.0-29.9) 11/01/2015  . Insomnia 11/01/2015  . Orthostatic hypotension 08/17/2015  . 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 Atypical chest pain 08/17/2015  . Hyperlipidemia 07/24/2015    Past Medical History:  Diagnosis Date  . Atypical chest pain 08/17/2015  . 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 Orthostatic hypotension 08/17/2015  . PUD (peptic ulcer disease)     Past Surgical History:  Procedure Laterality Date  . CESAREAN SECTION      Social History   Tobacco Use  . Smoking status: Never Smoker  .  Smokeless tobacco: Never Used  Substance Use Topics  . Alcohol use: No  . Drug use: No    Family History  Problem Relation Age of Onset  . Spina bifida Son   . Hyperlipidemia Daughter   . Hyperlipidemia Mother   . Heart disease Sister   . Hyperlipidemia Sister   . Hyperlipidemia Other   . Breast cancer Paternal Aunt     No Known Allergies  Medication list has been reviewed and updated.  Current Outpatient Medications on File Prior to Visit  Medication Sig Dispense Refill  . atorvastatin (LIPITOR) 40 MG tablet Take 1 tablet (40 mg total) by mouth daily. 90 tablet 3  . glucose blood (FREESTYLE LITE) test strip Use once daily to check blood sugar 100 each 4  . glucose monitoring kit (FREESTYLE) monitoring kit 1 each by Does not apply route daily. 1 each 0  . Multiple Vitamin (MULTIVITAMIN WITH MINERALS) TABS tablet Take 1 tablet by mouth daily.    . traZODone (DESYREL) 50 MG tablet Take 0.5-1 tablets (25-50 mg total) by mouth at bedtime as needed for sleep. 90 tablet 3  . lidocaine (XYLOCAINE) 4 % external solution 0.25 mL into each nostril as directed PRN headache (Patient not taking: Reported on 05/06/2017) 50 mL 0  . topiramate (TOPAMAX)  50 MG tablet Take 1 tablet (50 mg total) by mouth at bedtime. (Patient not taking: Reported on 05/06/2017) 30 tablet 1   No current facility-administered medications on file prior to visit.     ROS ROS otherwise unremarkable unless listed above.  Physical Examination: BP 124/88   Pulse 92   Temp 98.9 F (37.2 C) (Oral)   Resp 16   Ht '5\' 6"'$  (1.676 m)   Wt 183 lb (83 kg)   LMP 04/28/2017   SpO2 96%   BMI 29.54 kg/m  Ideal Body Weight: Weight in (lb) to have BMI = 25: 154.6  Physical Exam  Constitutional: She is oriented to person, place, and time. She appears well-developed and well-nourished. No distress.  HENT:  Head: Normocephalic and atraumatic.  Right Ear: Tympanic membrane, external ear and ear canal normal.  Left Ear:  Tympanic membrane, external ear and ear canal normal.  Nose: Mucosal edema and rhinorrhea present. Right sinus exhibits no maxillary sinus tenderness and no frontal sinus tenderness. Left sinus exhibits no maxillary sinus tenderness and no frontal sinus tenderness.  Mouth/Throat: No uvula swelling. No oropharyngeal exudate, posterior oropharyngeal edema or posterior oropharyngeal erythema.  Eyes: Conjunctivae and EOM are normal. Pupils are equal, round, and reactive to light.  Cardiovascular: Normal rate and regular rhythm. Exam reveals no gallop, no distant heart sounds and no friction rub.  No murmur heard. Pulmonary/Chest: Effort normal. No respiratory distress. She has no decreased breath sounds. She has no wheezes. She has no rhonchi.  Lymphadenopathy:       Head (right side): No submandibular, no tonsillar, no preauricular and no posterior auricular adenopathy present.       Head (left side): No submandibular, no tonsillar, no preauricular and no posterior auricular adenopathy present.       Right cervical: No superficial cervical and no posterior cervical adenopathy present.      Left cervical: No superficial cervical and no posterior cervical adenopathy present.  Neurological: She is alert and oriented to person, place, and time. She has normal strength. No cranial nerve deficit or sensory deficit. She exhibits normal muscle tone. Coordination and gait normal.  Skin: She is not diaphoretic.  Psychiatric: She has a normal mood and affect. Her behavior is normal.    Results for orders placed or performed in visit on 05/06/17  POCT CBC  Result Value Ref Range   WBC 6.9 4.6 - 10.2 K/uL   Lymph, poc 2.9 0.6 - 3.4   POC LYMPH PERCENT 41.9 10 - 50 %L   MID (cbc) 0.3 0 - 0.9   POC MID % 3.9 0 - 12 %M   POC Granulocyte 3.7 2 - 6.9   Granulocyte percent 54.2 37 - 80 %G   RBC 4.78 4.04 - 5.48 M/uL   Hemoglobin 12.6 12.2 - 16.2 g/dL   HCT, POC 38.2 37.7 - 47.9 %   MCV 79.9 (A) 80 - 97 fL    MCH, POC 26.4 (A) 27 - 31.2 pg   MCHC 33.1 31.8 - 35.4 g/dL   RDW, POC 14.8 %   Platelet Count, POC 346 142 - 424 K/uL   MPV 8.4 0 - 99.8 fL  POCT glucose (manual entry)  Result Value Ref Range   POC Glucose 70 70 - 99 mg/dl    Assessment and Plan: Rachel Rachel [name] is a 42 y.o. female who is here today for cc of  Chief Complaint  Patient presents with  . Headache    pt states  she has sharp pain headache/ x 2wk. pt states she has puffness on Rachel [name] of her head.   . Dizziness  tention type headache, vs, sinus headache.  Given msk relaxer and nsaids.  Nasal spray to open nasal turbinate given.  Acute nonintractable headache, unspecified headache type - Plan: EKG 12-Lead, POCT CBC, POCT glucose (manual entry), Basic metabolic panel, Sedimentation Rate, ibuprofen (ADVIL,MOTRIN) 600 MG tablet, tiZANidine (ZANAFLEX) 4 MG tablet, fluticasone (FLONASE) 50 MCG/ACT nasal spray  Rachel Drape, PA-C [facility] 2/26/20194:11 PM

## 2017-05-07 LAB — BASIC METABOLIC PANEL
BUN / CREAT RATIO: 16 (ref 9–23)
BUN: 8 mg/dL (ref 6–24)
CHLORIDE: 97 mmol/L (ref 96–106)
CO2: 21 mmol/L (ref 20–29)
Calcium: 9.4 mg/dL (ref 8.7–10.2)
Creatinine, Ser: 0.5 mg/dL — ABNORMAL LOW (ref 0.57–1.00)
GFR calc Af Amer: 140 mL/min/{1.73_m2} (ref >59)
GFR calc non Af Amer: 121 mL/min/{1.73_m2} (ref >59)
GLUCOSE: 85 mg/dL (ref 65–99)
Potassium: 4.2 mmol/L (ref 3.5–5.2)
Sodium: 138 mmol/L (ref 134–144)

## 2017-05-07 LAB — SEDIMENTATION RATE: SED RATE: 23 mm/h (ref 0–32)

## 2017-05-08 ENCOUNTER — Encounter: Payer: Self-pay | Admitting: Physician Assistant

## 2017-05-13 ENCOUNTER — Ambulatory Visit: Payer: No Typology Code available for payment source | Admitting: Physician Assistant

## 2017-05-15 ENCOUNTER — Other Ambulatory Visit: Payer: Self-pay

## 2017-05-15 ENCOUNTER — Ambulatory Visit (INDEPENDENT_AMBULATORY_CARE_PROVIDER_SITE_OTHER): Payer: No Typology Code available for payment source | Admitting: Physician Assistant

## 2017-05-15 ENCOUNTER — Encounter: Payer: Self-pay | Admitting: Physician Assistant

## 2017-05-15 VITALS — BP 104/80 | HR 97 | Temp 98.9°F | Resp 18 | Ht 66.0 in | Wt 183.2 lb

## 2017-05-15 DIAGNOSIS — G44201 Tension-type headache, unspecified, intractable: Secondary | ICD-10-CM | POA: Diagnosis not present

## 2017-05-15 LAB — POCT URINE PREGNANCY: PREG TEST UR: NEGATIVE

## 2017-05-15 MED ORDER — BUTALBITAL-APAP-CAFFEINE 50-325-40 MG PO TABS: 1.0000 | ORAL_TABLET | Freq: Four times a day (QID) | ORAL | 0 refills | Status: DC | PRN

## 2017-05-15 NOTE — Patient Instructions (Addendum)
Please make sure you are hydrating well with water.  Please await contact for the referral to headache clinic.   Acetaminophen; Butalbital; Caffeine tablets or capsules What is this medicine? ACETAMINOPHEN; BUTALBITAL; CAFFEINE (a set a MEE noe fen; byoo TAL bi tal; KAF een) is a pain reliever. It is used to treat tension headaches. This medicine may be used for other purposes; ask your health care provider or pharmacist if you have questions. COMMON BRAND NAME(S): Alagesic, Americet, Anolor-300, Arcet, BAC, CAPACET, Dolgic Plus, Esgic, Esgic Plus, Ezol, Fioricet, Ryder Systemeone, Margesic, Medigesic, ViningOrbivan, [address] [place], Phrenilin Forte, Repan, Rhinelanderenake, Triad, Zebutal What should I tell my health care provider before I take this medicine? They need to know if you have any of these conditions: -drug abuse or addiction -heart or circulation problems -if you often drink alcohol -kidney disease or problems going to the bathroom -liver disease -lung disease, asthma, or breathing problems -porphyria -an unusual or allergic reaction to acetaminophen, butalbital or other barbiturates, caffeine, other medicines, foods, dyes, or preservatives -pregnant or trying to get pregnant -breast-feeding How should I use this medicine? Take this medicine by mouth with a full glass of water. Follow the directions on the prescription label. If the medicine upsets your stomach, take the medicine with food or milk. Do not take more than you are told to take. Talk to your pediatrician regarding the use of this medicine in children. Special care may be needed. Overdosage: If you think you have taken too much of this medicine contact a poison control center or emergency room at once. NOTE: This medicine is only for you. Do not share this medicine with others. What if I miss a dose? If you miss a dose, take it as soon as you can. If it is almost time for your next dose, take only that dose. Do not take double or extra doses. What may  interact with this medicine? -alcohol or medicines that contain alcohol -antidepressants, especially MAOIs like isocarboxazid, phenelzine, tranylcypromine, and selegiline -antihistamines -benzodiazepines -carbamazepine -isoniazid -medicines for pain like pentazocine, buprenorphine, butorphanol, nalbuphine, tramadol, and propoxyphene -muscle relaxants -naltrexone -phenobarbital, phenytoin, and fosphenytoin -phenothiazines like perphenazine, thioridazine, chlorpromazine, mesoridazine, fluphenazine, prochlorperazine, promazine, and trifluoperazine -voriconazole This list may not describe all possible interactions. Give your health care provider a list of all the medicines, herbs, non-prescription drugs, or dietary supplements you use. Also tell them if you smoke, drink alcohol, or use illegal drugs. Some items may interact with your medicine. What should I watch for while using this medicine? Tell your doctor or health care professional if your pain does not go away, if it gets worse, or if you have new or a different type of pain. You may develop tolerance to the medicine. Tolerance means that you will need a higher dose of the medicine for pain relief. Tolerance is normal and is expected if you take the medicine for a long time. Do not suddenly stop taking your medicine because you may develop a severe reaction. Your body becomes used to the medicine. This does NOT mean you are addicted. Addiction is a behavior related to getting and using a drug for a non-medical reason. If you have pain, you have a medical reason to take pain medicine. Your doctor will tell you how much medicine to take. If your doctor wants you to stop the medicine, the dose will be slowly lowered over time to avoid any side effects. You may get drowsy or dizzy when you first start taking the medicine or change  doses. Do not drive, use machinery, or do anything that may be dangerous until you know how the medicine affects you.  Stand or sit up slowly. Do not take other medicines that contain acetaminophen with this medicine. Always read labels carefully. If you have questions, ask your doctor or pharmacist. If you take too much acetaminophen get medical help right away. Too much acetaminophen can be very dangerous and cause liver damage. Even if you do not have symptoms, it is important to get help right away. What side effects may I notice from receiving this medicine? Side effects that you should report to your doctor or health care professional as soon as possible: -allergic reactions like skin rash, itching or hives, swelling of the face, lips, or tongue -breathing problems -confusion -feeling faint or lightheaded, falls -redness, blistering, peeling or loosening of the skin, including inside the mouth -seizure -stomach pain -yellowing of the eyes or skin Side effects that usually do not require medical attention (report to your doctor or health care professional if they continue or are bothersome): -constipation -nausea, vomiting This list may not describe all possible side effects. Call your doctor for medical advice about side effects. You may report side effects to FDA at 1-800-FDA-1088. Where should I keep my medicine? Keep out of the reach of children. This medicine can be abused. Keep your medicine in a safe place to protect it from theft. Do not share this medicine with anyone. Selling or giving away this medicine is dangerous and against the law. This medicine may cause accidental overdose and death if it taken by other adults, children, or pets. Mix any unused medicine with a substance like cat litter or coffee grounds. Then throw the medicine away in a sealed container like a sealed bag or a coffee can with a lid. Do not use the medicine after the expiration date. Store at room temperature between 15 and 30 degrees C (59 and 86 degrees F). NOTE: This sheet is a summary. It may not cover all possible  information. If you have questions about this medicine, talk to your doctor, pharmacist, or health care provider.  2018 Elsevier/Gold Standard (2013-05-01 15:00:25)     IF you received an x-ray today, you will receive an invoice from [facility]. Please contact [facility] at [phone] with questions or concerns regarding your invoice.   IF you received labwork today, you will receive an invoice from [facility]. Please contact [facility] at [phone] with questions or concerns regarding your invoice.   Our billing staff will not be able to assist you with questions regarding bills from these companies.  You will be contacted with the lab results as soon as they are available. The fastest way to get your results is to activate your My Chart account. Instructions are located on the last page of this paperwork. If you have not heard from [place] regarding the results in 2 weeks, please contact this office.

## 2017-05-15 NOTE — Progress Notes (Signed)
PRIMARY CARE AT [facility] [address] [place] [address] [phone]  Date:  05/15/2017   Name:  Rachel [name]   DOB:  [birth year]   MRN:  [id number]  PCP:  Harrison Mons, PA-C    History of Present Illness:  Rachel [name] is a 42 y.o. female patient who presents to PCP with  Chief Complaint  Patient presents with  . Headache    Pt states she didn't take the Zanaflex often because of work. Pt states she took it maybe every other day at night. Pt states Flonase is working okay.  . Follow-up      Patient reports that she continues to have the headache at the crown of her head.  She also feels this at the back of her head.  No auras.  No emesis.  She takes the Zanaflex at night only, due to the drowsiness. She reports the ibuprofen does the same thing.     Patient Active Problem List   Diagnosis Date Noted  . Headache 12/28/2016  . Prediabetes 12/12/2016  . Overweight (BMI 25.0-29.9) 11/01/2015  . Insomnia 11/01/2015  . Orthostatic hypotension 08/17/2015  . 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 Atypical chest pain 08/17/2015  . Hyperlipidemia 07/24/2015    Past Medical History:  Diagnosis Date  . Atypical chest pain 08/17/2015  . 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 Orthostatic hypotension 08/17/2015  . PUD (peptic ulcer disease)     Past Surgical History:  Procedure Laterality Date  . CESAREAN SECTION      Social History   Tobacco Use  . Smoking status: Never Smoker  . Smokeless tobacco: Never Used  Substance Use Topics  . Alcohol use: No  . Drug use: No    Family History  Problem Relation Age of Onset  . Spina bifida Son   . Hyperlipidemia Daughter   . Hyperlipidemia Mother   . Heart disease Sister   . Hyperlipidemia Sister   . Hyperlipidemia Other   . Breast cancer Paternal Aunt     No Known Allergies  Medication list has been reviewed and updated.  Current Outpatient Medications on File Prior to Visit  Medication Sig Dispense Refill  .  atorvastatin (LIPITOR) 40 MG tablet Take 1 tablet (40 mg total) by mouth daily. 90 tablet 3  . fluticasone (FLONASE) 50 MCG/ACT nasal spray Place 2 sprays into both nostrils daily. 16 g 12  . glucose blood (FREESTYLE LITE) test strip Use once daily to check blood sugar 100 each 4  . glucose monitoring kit (FREESTYLE) monitoring kit 1 each by Does not apply route daily. 1 each 0  . ibuprofen (ADVIL,MOTRIN) 600 MG tablet Take 1 tablet (600 mg total) by mouth every 8 (eight) hours as needed. 30 tablet 0  . Multiple Vitamin (MULTIVITAMIN WITH MINERALS) TABS tablet Take 1 tablet by mouth daily.    Marland Kitchen tiZANidine (ZANAFLEX) 4 MG tablet Take 1 tablet (4 mg total) by mouth every 6 (six) hours as needed for muscle spasms. 30 tablet 0  . lidocaine (XYLOCAINE) 4 % external solution 0.25 mL into each nostril as directed PRN headache (Patient not taking: Reported on 05/06/2017) 50 mL 0  . topiramate (TOPAMAX) 50 MG tablet Take 1 tablet (50 mg total) by mouth at bedtime. (Patient not taking: Reported on 05/06/2017) 30 tablet 1  . traZODone (DESYREL) 50 MG tablet Take 0.5-1 tablets (25-50 mg total) by mouth at bedtime as needed for sleep. (Patient not taking: Reported on 05/15/2017) 90 tablet 3   No  current facility-administered medications on file prior to visit.     ROS ROS otherwise unremarkable unless listed above.  Physical Examination: BP 104/80 (BP Location: Right Arm, Patient Position: Sitting, Cuff Size: Normal)   Pulse 97   Temp 98.9 F (37.2 C) (Oral)   Resp 18   Ht '5\' 6"'$  (1.676 m)   Wt 183 lb 3.2 oz (83.1 kg)   LMP 04/28/2017   SpO2 99%   BMI 29.57 kg/m  Ideal Body Weight: Weight in (lb) to have BMI = 25: 154.6  Physical Exam  Constitutional: She is oriented to person, place, and time. She appears well-developed and well-nourished. No distress.  HENT:  Head: Normocephalic and atraumatic.  Right Ear: Tympanic membrane, external ear and ear canal normal.  Left Ear: Tympanic membrane,  external ear and ear canal normal.  Nose: No mucosal edema or rhinorrhea. Right sinus exhibits no maxillary sinus tenderness and no frontal sinus tenderness. Left sinus exhibits no maxillary sinus tenderness and no frontal sinus tenderness.  Mouth/Throat: No uvula swelling. No oropharyngeal exudate, posterior oropharyngeal edema or posterior oropharyngeal erythema.  Eyes: Conjunctivae and EOM are normal. Pupils are equal, round, and reactive to light.  Cardiovascular: Normal rate and regular rhythm. Exam reveals no gallop, no distant heart sounds and no friction rub.  No murmur heard. Pulmonary/Chest: Effort normal. No respiratory distress. She has no decreased breath sounds. She has no wheezes. She has no rhonchi.  Lymphadenopathy:       Head (right side): No submandibular, no tonsillar, no preauricular and no posterior auricular adenopathy present.       Head (left side): No submandibular, no tonsillar, no preauricular and no posterior auricular adenopathy present.  Neurological: She is alert and oriented to person, place, and time. No cranial nerve deficit. Coordination and gait normal.  Skin: She is not diaphoretic.  Psychiatric: She has a normal mood and affect. Her behavior is normal.   Results for orders placed or performed in visit on 05/15/17  POCT urine pregnancy  Result Value Ref Range   Preg Test, Ur Negative Negative     Assessment and Plan: Rachel [name] is a 42 y.o. female who is here today for cc of  Chief Complaint  Patient presents with  . Headache    Pt states she didn't take the Zanaflex often because of work. Pt states she took it maybe every other day at night. Pt states Flonase is working okay.  . Follow-up  --will attempt Fioricet.  --she request imaging at this time.  I will refer her to the headache clinic for consult, and offer possible treatment vs invasive imaging such as MRI.  Alarming sx's to warrant an immediate return were given. Intractable tension-type  headache, unspecified chronicity pattern - Plan: butalbital-acetaminophen-caffeine (FIORICET, ESGIC) 50-325-40 MG tablet, AMB referral to headache clinic, POCT urine pregnancy  Ivar Drape, PA-C [facility] 3/1/20194:58 PM

## 2017-05-17 ENCOUNTER — Encounter: Payer: Self-pay | Admitting: Physician Assistant

## 2017-05-30 NOTE — Telephone Encounter (Signed)
Pt insurance is not in network with [facility], so sent to [facility] wq 3/14

## 2017-06-05 ENCOUNTER — Encounter: Payer: Self-pay | Admitting: Neurology

## 2017-06-05 ENCOUNTER — Ambulatory Visit: Payer: No Typology Code available for payment source | Admitting: Neurology

## 2017-06-05 ENCOUNTER — Other Ambulatory Visit: Payer: Self-pay

## 2017-06-05 VITALS — BP 111/74 | HR 95 | Resp 14 | Ht 66.0 in | Wt 186.0 lb

## 2017-06-05 DIAGNOSIS — G43709 Chronic migraine without aura, not intractable, without status migrainosus: Secondary | ICD-10-CM | POA: Diagnosis not present

## 2017-06-05 DIAGNOSIS — IMO0002 Reserved for concepts with insufficient information to code with codable children: Secondary | ICD-10-CM

## 2017-06-05 MED ORDER — NORTRIPTYLINE HCL 10 MG PO CAPS: 20.0000 mg | ORAL_CAPSULE | Freq: Every day | ORAL | 11 refills | Status: DC

## 2017-06-05 MED ORDER — ONDANSETRON 4 MG PO TBDP: 4.0000 mg | ORAL_TABLET | Freq: Three times a day (TID) | ORAL | 6 refills | Status: DC | PRN

## 2017-06-05 MED ORDER — SUMATRIPTAN SUCCINATE 100 MG PO TABS: 100.0000 mg | ORAL_TABLET | Freq: Once | ORAL | 11 refills | Status: DC | PRN

## 2017-06-05 NOTE — Progress Notes (Signed)
PATIENT: Rachel [name] DOB: 1975/07/14  Chief Complaint  Patient presents with  . Headache    Rm. 4.  Headaches for over one year, worse in the last month.  Constant, daily h/a that waxes and wanes. Light and sound sensitivity, sometimes n/v.  Minimal relief with Ibuprofen and Fioricet.  Unable to tolerate Topamax (gi upset)/fim     HISTORICAL  Rachel [name] is a 42 years old female, seen in refer by her primary care PA Harrison Mons for evaluation of worsening headaches, initial evaluation was on June 05, 2017.  She had a past medical history of hyperlipidemia, reported longtime chronic stress taking care of her special needs child.  She reported history of migraine headache in the past, increased frequency since 2018, especially since January 2019.  She also suffered holoacranial pounding headache with associated light noise sensitivity, nauseous, movement make her headache worse, she tends to lie down in dark quiet room, before January 2019, she has had a couple times a week, but since January, she has started on a daily basis, constant Botox area occipital area pressure pain, frequent exacerbations, chronic migraine headaches she has been taking daily Fioricet, ibuprofen 600 mg with suboptimal control of her headaches, headache last all day long.  She was given Topamax 50 mg every night, complains of slow thinking, GI side effects, there is no significant benefit noted.  She denies visual change, was seen by ophthalmologist in August 2018, no significant abnormality noted  REVIEW OF SYSTEMS: Full 14 system review of systems performed and notable only for as above ALLERGIES: No Known Allergies  HOME MEDICATIONS: Current Outpatient Medications  Medication Sig Dispense Refill  . atorvastatin (LIPITOR) 40 MG tablet Take 1 tablet (40 mg total) by mouth daily. 90 tablet 3  . butalbital-acetaminophen-caffeine (FIORICET, ESGIC) 50-325-40 MG tablet Take 1-2 tablets by mouth every 6  (six) hours as needed for headache. 20 tablet 0  . fluticasone (FLONASE) 50 MCG/ACT nasal spray Place 2 sprays into both nostrils daily. 16 g 12  . glucose blood (FREESTYLE LITE) test strip Use once daily to check blood sugar 100 each 4  . glucose monitoring kit (FREESTYLE) monitoring kit 1 each by Does not apply route daily. 1 each 0  . ibuprofen (ADVIL,MOTRIN) 600 MG tablet Take 1 tablet (600 mg total) by mouth every 8 (eight) hours as needed. 30 tablet 0  . Multiple Vitamin (MULTIVITAMIN WITH MINERALS) TABS tablet Take 1 tablet by mouth daily.    Marland Kitchen tiZANidine (ZANAFLEX) 4 MG tablet Take 1 tablet (4 mg total) by mouth every 6 (six) hours as needed for muscle spasms. 30 tablet 0  . lidocaine (XYLOCAINE) 4 % external solution 0.25 mL into each nostril as directed PRN headache (Patient not taking: Reported on 05/06/2017) 50 mL 0  . topiramate (TOPAMAX) 50 MG tablet Take 1 tablet (50 mg total) by mouth at bedtime. (Patient not taking: Reported on 05/06/2017) 30 tablet 1  . traZODone (DESYREL) 50 MG tablet Take 0.5-1 tablets (25-50 mg total) by mouth at bedtime as needed for sleep. (Patient not taking: Reported on 05/15/2017) 90 tablet 3   No current facility-administered medications for this visit.     PAST MEDICAL HISTORY: Past Medical History:  Diagnosis Date  . Atypical chest pain 08/17/2015  . 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 Orthostatic hypotension 08/17/2015  . PUD (peptic ulcer disease)     PAST SURGICAL HISTORY: Past Surgical History:  Procedure Laterality Date  . CESAREAN SECTION  FAMILY HISTORY: Family History  Problem Relation Age of Onset  . Spina bifida Son   . Hyperlipidemia Daughter   . Hyperlipidemia Mother   . Heart disease Sister   . Hyperlipidemia Sister   . Hyperlipidemia Other   . Breast cancer Paternal Aunt     SOCIAL HISTORY:  Social History   Socioeconomic History  . Marital status: Married    Spouse name: Not on file  . Number of children: 2    . Years of education: Not on file  . Highest education level: Not on file  Social Needs  . Financial resource strain: Not on file  . Food insecurity - worry: Not on file  . Food insecurity - inability: Not on file  . Transportation needs - medical: Not on file  . Transportation needs - non-medical: Not on file  Occupational History  . 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 Smoking status: Never Smoker  . Smokeless tobacco: Never Used  Substance and Sexual Activity  . Alcohol use: No  . Drug use: No  . Sexual activity: Yes    Birth control/protection: Condom  Other Topics Concern  . Not on file  Social History Narrative   Lives with her husband and their 2 children   Her son is wheelchair bound due to spina bifida     PHYSICAL EXAM   Vitals:   06/05/17 0725  BP: 111/74  Pulse: 95  Resp: 14  Weight: 186 lb (84.4 kg)  Height: '5\' 6"'$  (1.676 m)    Not recorded      Body mass index is 30.02 kg/m.  PHYSICAL EXAMNIATION:  Gen: NAD, conversant, well nourised, obese, well groomed                     Cardiovascular: Regular rate rhythm, no peripheral edema, warm, nontender. Eyes: Conjunctivae clear without exudates or hemorrhage Neck: Supple, no carotid bruits. Pulmonary: Clear to auscultation bilaterally   NEUROLOGICAL EXAM:  MENTAL STATUS: Speech:    Speech is normal; fluent and spontaneous with normal comprehension.  Cognition:     Orientation to time, place and person     Normal recent and remote memory     Normal Attention span and concentration     Normal Language, naming, repeating,spontaneous speech     Fund of knowledge   CRANIAL NERVES: CN II: Visual fields are full to confrontation. Fundoscopic exam is normal with sharp discs and no vascular changes. Pupils are round equal and briskly reactive to light. CN III, IV, VI: extraocular movement are normal. No ptosis. CN V: Facial sensation is intact to pinprick in all 3 divisions  bilaterally. Corneal responses are intact.  CN VII: Face is symmetric with normal eye closure and smile. CN VIII: Hearing is normal to rubbing fingers CN IX, X: Palate elevates symmetrically. Phonation is normal. CN XI: Head turning and shoulder shrug are intact CN XII: Tongue is midline with normal movements and no atrophy.  MOTOR: There is no pronator drift of out-stretched arms. Muscle bulk and tone are normal. Muscle strength is normal.  REFLEXES: Reflexes are 2+ and symmetric at the biceps, triceps, knees, and ankles. Plantar responses are flexor.  SENSORY: Intact to light touch, pinprick, positional sensation and vibratory sensation are intact in fingers and toes.  COORDINATION: Rapid alternating movements and fine finger movements are intact. There is no dysmetria on finger-to-nose and heel-knee-shin.    GAIT/STANCE: Posture is normal. Gait is steady  with normal steps, base, arm swing, and turning. Heel and toe walking are normal. Tandem gait is normal.  Romberg is absent.   DIAGNOSTIC DATA (LABS, IMAGING, TESTING) - I reviewed patient records, labs, notes, testing and imaging myself where available.   ASSESSMENT AND PLAN  Mehek Hollingsworth is a 42 y.o. female   Chronic migraine headaches  Likely a component of medicine rebound headache with daily Fioricet, ibuprofen use.  Start preventive medication nortriptyline, titrating to 20 mg daily  Previously tried Topamax not tolerated due to GI side effect, and mental slowing.  Imitrex plus Zofran as needed    Marcial Pacas, M.D. Ph.D.  [facility] [address] [place], [place] [address] Ph: [phone] Fax: [phone]  CC: Harrison Mons, PA-C

## 2017-06-20 ENCOUNTER — Encounter: Payer: Self-pay | Admitting: Physician Assistant

## 2017-06-26 ENCOUNTER — Encounter: Payer: Self-pay | Admitting: Physician Assistant

## 2017-06-26 ENCOUNTER — Other Ambulatory Visit: Payer: Self-pay

## 2017-06-26 ENCOUNTER — Ambulatory Visit (INDEPENDENT_AMBULATORY_CARE_PROVIDER_SITE_OTHER): Payer: No Typology Code available for payment source | Admitting: Physician Assistant

## 2017-06-26 VITALS — BP 104/72 | HR 92 | Temp 98.6°F | Resp 16 | Ht 66.0 in | Wt 181.8 lb

## 2017-06-26 DIAGNOSIS — Z0000 Encounter for general adult medical examination without abnormal findings: Secondary | ICD-10-CM

## 2017-06-26 DIAGNOSIS — Z130 Encounter for screening for diseases of the blood and blood-forming organs and certain disorders involving the immune mechanism: Secondary | ICD-10-CM | POA: Diagnosis not present

## 2017-06-26 DIAGNOSIS — E785 Hyperlipidemia, unspecified: Secondary | ICD-10-CM

## 2017-06-26 DIAGNOSIS — R7303 Prediabetes: Secondary | ICD-10-CM

## 2017-06-26 DIAGNOSIS — Z114 Encounter for screening for human immunodeficiency virus [HIV]: Secondary | ICD-10-CM | POA: Diagnosis not present

## 2017-06-26 DIAGNOSIS — F4322 Adjustment disorder with anxiety: Secondary | ICD-10-CM | POA: Insufficient documentation

## 2017-06-26 DIAGNOSIS — F4320 Adjustment disorder, unspecified: Secondary | ICD-10-CM | POA: Diagnosis not present

## 2017-06-26 DIAGNOSIS — K5900 Constipation, unspecified: Secondary | ICD-10-CM

## 2017-06-26 DIAGNOSIS — Z1329 Encounter for screening for other suspected endocrine disorder: Secondary | ICD-10-CM

## 2017-06-26 MED ORDER — CLONAZEPAM 0.5 MG PO TABS: 0.5000 mg | ORAL_TABLET | Freq: Two times a day (BID) | ORAL | 1 refills | Status: DC | PRN

## 2017-06-26 MED ORDER — METFORMIN HCL ER 500 MG PO TB24: 500.0000 mg | ORAL_TABLET | Freq: Every day | ORAL | 3 refills | Status: DC

## 2017-06-26 NOTE — Assessment & Plan Note (Signed)
Healthy eating-continue. Exercise limited by life stressors. Add metformin, which may help with weight loss.

## 2017-06-26 NOTE — Assessment & Plan Note (Signed)
PRN clonazepam. Anticipates use 1-2 times/month. If needs it more frequently, would add buproprion, especially if she does not tolerate metformin, as it may help to reduce emotional eating tendencies and is less likely than SSRIs to be associated with weight gain.

## 2017-06-26 NOTE — Progress Notes (Signed)
Patient ID: Rachel [name], female    DOB: 02/09/1976, 42 y.o.   MRN: [id number]  PCP: Harrison Mons, PA-C  Chief Complaint  Patient presents with  . Annual Exam  . Abdominal Pain    Subjective:   Presents for [employer] Visit.  In addition to routine screenings and follow-up of pre-diabetes and hyperlipidemia, she reports concern for possibe hernia and difficulty losing weight.  For the past 2 weeks, she has noted intermittent pain, just below the umbilicus, associated with constipation. No bulging. No injury. She uses an OTC stool softener, drinks plenty of water and her diet is high in fiber.  She eats a very healthy diet. 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 Wants something to help her lose weight.  Cervical Cancer Screening: last pap 11/01/2015, negative cytology and HPV. Repeat co-testing in 2022. Breast Cancer Screening: maternal aunt died of breast cancer in her 82's. No other family history. Patient performs monthly SBE. CBE today. Consider mammogram for baseline. Colorectal Cancer Screening: not yet a candidate Bone Density Testing: not yet a candidate HIV Screening: today STI Screening: very low risk Seasonal Influenza Vaccination:12/26/16 Td/Tdap Vaccination: 01/17/14 Pneumococcal Vaccination: not yet a candidate Zoster Vaccination: not yet a candidate Frequency of Dental evaluation: Q12 months Frequency of Eye evaluation: annually   Review of Systems  Constitutional: Negative.  Negative for activity change, appetite change, fatigue and unexpected weight change.  HENT: Negative for congestion, dental problem, ear pain, hearing loss, mouth sores, postnasal drip, rhinorrhea, sneezing, sore throat, tinnitus and trouble swallowing.   Eyes: Negative for photophobia, pain, redness and visual disturbance.  Respiratory: Negative for cough, chest tightness, shortness of breath and  wheezing.   Cardiovascular: Negative for chest pain, palpitations and leg swelling.  Gastrointestinal: Positive for abdominal pain and constipation. Negative for blood in stool, diarrhea, nausea and vomiting.  Endocrine: Negative for cold intolerance, heat intolerance, polydipsia, polyphagia and polyuria.  Genitourinary: Negative for dysuria, frequency, hematuria and urgency.  Musculoskeletal: Negative for arthralgias, gait problem, myalgias and neck stiffness.  Skin: Negative for rash.  Allergic/Immunologic: Negative.   Neurological: Positive for headaches (migraine, managed by neurology). Negative for dizziness, speech difficulty, weakness, light-headedness and numbness.  Hematological: Negative for adenopathy.  Psychiatric/Behavioral: Positive for sleep disturbance (due to home stressors, special needs child requiring her assistance). Negative for confusion and decreased concentration. The patient is nervous/anxious (feels overwhelmed by workand home stressors).    Depression screen [facility] 2/9 05/15/2017 12/28/2016 12/07/2016 01/18/2016 07/23/2015  Decreased Interest 0 0 0 0 0  Down, Depressed, Hopeless 0 0 0 0 0  PHQ - 2 Score 0 0 0 0 0     Patient Active Problem List   Diagnosis Date Noted  . Chronic migraine 06/05/2017  . Headache 12/28/2016  . Prediabetes 12/12/2016  . Overweight (BMI 25.0-29.9) 11/01/2015  . Insomnia 11/01/2015  . Orthostatic hypotension 08/17/2015  . 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 Atypical chest pain 08/17/2015  . Hyperlipidemia 07/24/2015    Past Medical History:  Diagnosis Date  . Atypical chest pain 08/17/2015  . 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 Orthostatic hypotension 08/17/2015  . PUD (peptic ulcer disease)      Prior to Admission medications   Medication Sig Start Date End Date Taking? Authorizing Provider  atorvastatin (LIPITOR) 40 MG tablet Take 1 tablet (40 mg total) by mouth daily. 12/07/16   Harrison Mons, PA-C  fluticasone (  FLONASE)  50 MCG/ACT nasal spray Place 2 sprays into both nostrils daily. 05/06/17   Ivar Drape D, PA  glucose blood (FREESTYLE LITE) test strip Use once daily to check blood sugar 12/18/16   Davisha Linthicum, PA-C  glucose monitoring kit (FREESTYLE) monitoring kit 1 each by Does not apply route daily. 12/18/16   Harrison Mons, PA-C  ibuprofen (ADVIL,MOTRIN) 600 MG tablet Take 1 tablet (600 mg total) by mouth every 8 (eight) hours as needed. 05/06/17   Ivar Drape D, PA  Multiple Vitamin (MULTIVITAMIN WITH MINERALS) TABS tablet Take 1 tablet by mouth daily.    [provider]  nortriptyline (PAMELOR) 10 MG capsule Take 2 capsules (20 mg total) by mouth at bedtime. 06/05/17   Marcial Pacas, MD  ondansetron (ZOFRAN ODT) 4 MG disintegrating tablet Take 1 tablet (4 mg total) by mouth every 8 (eight) hours as needed. 06/05/17   Marcial Pacas, MD  SUMAtriptan (IMITREX) 100 MG tablet Take 1 tablet (100 mg total) by mouth once as needed for up to 1 dose for migraine. May repeat in 2 hours if headache persists or recurs.  Do not refill in less than 30 days 06/05/17   Marcial Pacas, MD    No Known Allergies  Past Surgical History:  Procedure Laterality Date  . CESAREAN SECTION      Family History  Problem Relation Age of Onset  . Spina bifida Son   . Hyperlipidemia Daughter   . Hyperlipidemia Mother   . Heart disease Sister   . Hyperlipidemia Sister   . Hyperlipidemia Other   . Breast cancer Paternal Aunt     Social History   Socioeconomic History  . Marital status: Married    Spouse name: Not on file  . Number of children: 2  . Years of education: Not on file  . Highest education level: Not on file  Occupational History  . 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 Financial resource strain: Not on file  . Food insecurity:    Worry: Not on file    Inability: Not on file  . Transportation needs:    Medical: Not on file    Non-medical: Not on file  Tobacco Use  .  Smoking status: Never Smoker  . Smokeless tobacco: Never Used  Substance and Sexual Activity  . Alcohol use: No  . Drug use: No  . Sexual activity: Yes    Birth control/protection: Condom  Lifestyle  . Physical activity:    Days per week: Not on file    Minutes per session: Not on file  . Stress: Not on file  Relationships  . Social connections:    Talks on phone: Not on file    Gets together: Not on file    Attends religious service: Not on file    Active member of club or organization: Not on file    Attends meetings of clubs or organizations: Not on file    Relationship status: Not on file  Other Topics Concern  . Not on file  Social History Narrative   Lives with her husband and their 2 children   Her son is wheelchair bound due to spina bifida        Objective:  Physical Exam  Constitutional: She is oriented to person, place, and time. Vital signs are normal. She appears well-developed and well-nourished. She is active and cooperative. No distress.  BP 104/72   Pulse 92   Temp 98.6 F (37  C)   Resp 16   Ht '5\' 6"'$  (1.676 m)   Wt 181 lb 12.8 oz (82.5 kg)   SpO2 98%   BMI 29.34 kg/m    HENT:  Head: Normocephalic and atraumatic.  Right Ear: Hearing, tympanic membrane, external ear and ear canal normal. No foreign bodies.  Left Ear: Hearing, tympanic membrane, external ear and ear canal normal. No foreign bodies.  Nose: Nose normal.  Mouth/Throat: Uvula is midline, oropharynx is clear and moist and mucous membranes are normal. No oral lesions. Normal dentition. No dental abscesses or uvula swelling. No oropharyngeal exudate.  Eyes: Pupils are equal, round, and reactive to light. Conjunctivae, EOM and lids are normal. Right eye exhibits no discharge. Left eye exhibits no discharge. No scleral icterus.  Fundoscopic exam:      The right eye shows no arteriolar narrowing, no AV nicking, no exudate, no hemorrhage and no papilledema. The right eye shows red reflex.        The left eye shows no arteriolar narrowing, no AV nicking, no exudate, no hemorrhage and no papilledema. The left eye shows red reflex.  Neck: Trachea normal, normal range of motion and full passive range of motion without pain. Neck supple. No spinous process tenderness and no muscular tenderness present. No thyroid mass and no thyromegaly present.  Cardiovascular: Normal rate, regular rhythm, normal heart sounds, intact distal pulses and normal pulses.  Pulmonary/Chest: Effort normal and breath sounds normal. Right breast exhibits no inverted nipple, no mass, no nipple discharge, no skin change and no tenderness. Left breast exhibits no inverted nipple, no mass, no nipple discharge, no skin change and no tenderness. Breasts are symmetrical.  Abdominal: Soft. Normal appearance and bowel sounds are normal. She exhibits no shifting dullness, no distension, no pulsatile liver, no fluid wave, no abdominal bruit, no ascites, no pulsatile midline mass and no mass. There is no hepatosplenomegaly, splenomegaly or hepatomegaly. There is tenderness (just inferior tothe umbilicus) in the periumbilical area. There is no rigidity, no rebound, no guarding, no tenderness at McBurney's point and negative Murphy's sign. No hernia. Hernia confirmed negative in the ventral area, confirmed negative in the right inguinal area and confirmed negative in the left inguinal area.  Slightly widened diastasis. No palpable hernia.  Musculoskeletal: She exhibits no edema or tenderness.       Cervical back: Normal.       Thoracic back: Normal.       Lumbar back: Normal.  Lymphadenopathy:       Head (right side): No tonsillar, no preauricular, no posterior auricular and no occipital adenopathy present.       Head (left side): No tonsillar, no preauricular, no posterior auricular and no occipital adenopathy present.    She has no cervical adenopathy.       Right: No supraclavicular adenopathy present.       Left: No supraclavicular  adenopathy present.  Neurological: She is alert and oriented to person, place, and time. She has normal strength and normal reflexes. No cranial nerve deficit. She exhibits normal muscle tone. Coordination and gait normal.  Skin: Skin is warm, dry and intact. No rash noted. She is not diaphoretic. No cyanosis or erythema. Nails show no clubbing.  Psychiatric: She has a normal mood and affect. Her speech is normal and behavior is normal. Judgment and thought content normal.     Wt Readings from Last 3 Encounters:  06/26/17 181 lb 12.8 oz (82.5 kg)  06/05/17 186 lb (84.4 kg)  05/15/17  183 lb 3.2 oz (83.1 kg)     Visual Acuity Screening   Right eye Left eye Both eyes  Without correction: '20/20 20/20 20/15 '$  With correction:            Assessment & Plan:   Problem List Items Addressed This Visit    Hyperlipidemia    Continue atorvastatin 40 mg daily, adjust per labs. Continue healthy eating. Exercise limited by life stressors.      Relevant Orders   Lipid panel   Comprehensive metabolic panel   Prediabetes    Healthy eating-continue. Exercise limited by life stressors. Add metformin, which may help with weight loss.      Relevant Medications   metFORMIN (GLUCOPHAGE-XR) 500 MG 24 hr tablet   Other Relevant Orders   Hemoglobin A1c   Microalbumin / creatinine urine ratio   Adult situational stress disorder    PRN clonazepam. Anticipates use 1-2 times/month. If needs it more frequently, would add buproprion, especially if she does not tolerate metformin, as it may help to reduce emotional eating tendencies and is less likely than SSRIs to be associated with weight gain.      Relevant Medications   clonazePAM (KLONOPIN) 0.5 MG tablet    Other Visit Diagnoses    Annual physical exam    -  Primary   Relevant Orders   CBC with Differential/Platelet   Constipation, unspecified constipation type       Anticipate improvement withthe addition of metformin. If not, try  addition of magnesium supplement, which may also help reduce migraine.   Screening for HIV (human immunodeficiency virus)       Relevant Orders   HIV antibody   Screening for thyroid disorder       Relevant Orders   TSH   Screening for deficiency anemia       Relevant Orders   CBC with Differential/Platelet       Return in about 3 months (around 09/25/2017), or if symptoms worsen or fail to improve, for re-evaluatation of fasting labs with Dr. Pamella Pert.   Fara Chute, PA-C [facility] [place]

## 2017-06-26 NOTE — Assessment & Plan Note (Signed)
Continue atorvastatin 40 mg daily, adjust per labs. Continue healthy eating. Exercise limited by life stressors.

## 2017-06-26 NOTE — Patient Instructions (Addendum)
IF the constipation persists after adding the metformin, try adding a magnesium supplement to your regimen to help with the constipation, and in turn, the abdominal pain. If it persists, let me know, and we will consider imaging.    IF you received an x-ray today, you will receive an invoice from [facility]. Please contact [facility] at [phone] with questions or concerns regarding your invoice.   IF you received labwork today, you will receive an invoice from [facility]. Please contact [facility] at [phone] with questions or concerns regarding your invoice.   Our billing staff will not be able to assist you with questions regarding bills from these companies.  You will be contacted with the lab results as soon as they are available. The fastest way to get your results is to activate your My Chart account. Instructions are located on the last page of this paperwork. If you have not heard from [place] regarding the results in 2 weeks, please contact this office.      Preventive Care 40-64 Years, Female Preventive care refers to lifestyle choices and visits with your health care provider that can promote health and wellness. What does preventive care include?  A yearly physical exam. This is also called an annual well check.  Dental exams once or twice a year.  Routine eye exams. Ask your health care provider how often you should have your eyes checked.  Personal lifestyle choices, including: ? Daily care of your teeth and gums. ? Regular physical activity. ? Eating a healthy diet. ? Avoiding tobacco and drug use. ? Limiting alcohol use. ? Practicing safe sex. ? Taking low-dose aspirin daily starting at age 44. ? Taking vitamin and mineral supplements as recommended by your health care provider. What happens during an annual well check? The services and screenings done by your health care provider during your annual well check will depend on your age, overall  health, lifestyle risk factors, and family history of disease. Counseling Your health care provider may ask you questions about your:  Alcohol use.  Tobacco use.  Drug use.  Emotional well-being.  Home and relationship well-being.  Sexual activity.  Eating habits.  Work and work Statistician.  Method of birth control.  Menstrual cycle.  Pregnancy history.  Screening You may have the following tests or measurements:  Height, weight, and BMI.  Blood pressure.  Lipid and cholesterol levels. These may be checked every 5 years, or more frequently if you are over 49 years old.  Skin check.  Lung cancer screening. You may have this screening every year starting at age 45 if you have a 30-pack-year history of smoking and currently smoke or have quit within the past 15 years.  Fecal occult blood test (FOBT) of the stool. You may have this test every year starting at age 59.  Flexible sigmoidoscopy or colonoscopy. You may have a sigmoidoscopy every 5 years or a colonoscopy every 10 years starting at age 75.  Hepatitis C blood test.  Hepatitis B blood test.  Sexually transmitted disease (STD) testing.  Diabetes screening. This is done by checking your blood sugar (glucose) after you have not eaten for a while (fasting). You may have this done every 1-3 years.  Mammogram. This may be done every 1-2 years. Talk to your health care provider about when you should start having regular mammograms. This may depend on whether you have a family history of breast cancer.  BRCA-related cancer screening. This may be done if you have a  family history of breast, ovarian, tubal, or peritoneal cancers.  Pelvic exam and Pap test. This may be done every 3 years starting at age 67. Starting at age 4, this may be done every 5 years if you have a Pap test in combination with an HPV test.  Bone density scan. This is done to screen for osteoporosis. You may have this scan if you are at high  risk for osteoporosis.  Discuss your test results, treatment options, and if necessary, the need for more tests with your health care provider. Vaccines Your health care provider may recommend certain vaccines, such as:  Influenza vaccine. This is recommended every year.  Tetanus, diphtheria, and acellular pertussis (Tdap, Td) vaccine. You may need a Td booster every 10 years.  Varicella vaccine. You may need this if you have not been vaccinated.  Zoster vaccine. You may need this after age 34.  Measles, mumps, and rubella (MMR) vaccine. You may need at least one dose of MMR if you were born in 1957 or later. You may also need a second dose.  Pneumococcal 13-valent conjugate (PCV13) vaccine. You may need this if you have certain conditions and were not previously vaccinated.  Pneumococcal polysaccharide (PPSV23) vaccine. You may need one or two doses if you smoke cigarettes or if you have certain conditions.  Meningococcal vaccine. You may need this if you have certain conditions.  Hepatitis A vaccine. You may need this if you have certain conditions or if you travel or work in places where you may be exposed to hepatitis A.  Hepatitis B vaccine. You may need this if you have certain conditions or if you travel or work in places where you may be exposed to hepatitis B.  Haemophilus influenzae type b (Hib) vaccine. You may need this if you have certain conditions.  Talk to your health care provider about which screenings and vaccines you need and how often you need them. This information is not intended to replace advice given to you by your health care provider. Make sure you discuss any questions you have with your health care provider. Document Released: 04/01/2015 Document Revised: 11/23/2015 Document Reviewed: 01/04/2015 Elsevier Interactive Patient Education  [employer].

## 2017-06-27 LAB — CBC WITH DIFFERENTIAL/PLATELET
BASOS: 1 %
Basophils Absolute: 0 10*3/uL (ref 0.0–0.2)
EOS (ABSOLUTE): 0.1 10*3/uL (ref 0.0–0.4)
Eos: 1 %
Hematocrit: 36 % (ref 34.0–46.6)
Hemoglobin: 12.2 g/dL (ref 11.1–15.9)
IMMATURE GRANS (ABS): 0 10*3/uL (ref 0.0–0.1)
Immature Granulocytes: 0 %
LYMPHS: 51 %
Lymphocytes Absolute: 2.5 10*3/uL (ref 0.7–3.1)
MCH: 27.6 pg (ref 26.6–33.0)
MCHC: 33.9 g/dL (ref 31.5–35.7)
MCV: 81 fL (ref 79–97)
Monocytes Absolute: 0.3 10*3/uL (ref 0.1–0.9)
Monocytes: 5 %
Neutrophils Absolute: 2 10*3/uL (ref 1.4–7.0)
Neutrophils: 42 %
PLATELETS: 378 10*3/uL (ref 150–379)
RBC: 4.42 x10E6/uL (ref 3.77–5.28)
RDW: 13.8 % (ref 12.3–15.4)
WBC: 4.8 10*3/uL (ref 3.4–10.8)

## 2017-06-27 LAB — LIPID PANEL
Chol/HDL Ratio: 6 ratio — ABNORMAL HIGH (ref 0.0–4.4)
Cholesterol, Total: 355 mg/dL — ABNORMAL HIGH (ref 100–199)
HDL: 59 mg/dL (ref >39)
LDL CALC: 279 mg/dL — AB (ref 0–99)
TRIGLYCERIDES: 85 mg/dL (ref 0–149)
VLDL Cholesterol Cal: 17 mg/dL (ref 5–40)

## 2017-06-27 LAB — COMPREHENSIVE METABOLIC PANEL
A/G RATIO: 1.4 (ref 1.2–2.2)
ALBUMIN: 4.2 g/dL (ref 3.5–5.5)
ALT: 12 IU/L (ref 0–32)
AST: 13 IU/L (ref 0–40)
Alkaline Phosphatase: 65 IU/L (ref 39–117)
BILIRUBIN TOTAL: 0.7 mg/dL (ref 0.0–1.2)
BUN/Creatinine Ratio: 15 (ref 9–23)
BUN: 7 mg/dL (ref 6–24)
CHLORIDE: 96 mmol/L (ref 96–106)
CO2: 23 mmol/L (ref 20–29)
Calcium: 9.3 mg/dL (ref 8.7–10.2)
Creatinine, Ser: 0.46 mg/dL — ABNORMAL LOW (ref 0.57–1.00)
GFR calc non Af Amer: 125 mL/min/{1.73_m2} (ref >59)
GFR, EST AFRICAN AMERICAN: 144 mL/min/{1.73_m2} (ref >59)
Globulin, Total: 3 g/dL (ref 1.5–4.5)
Glucose: 91 mg/dL (ref 65–99)
POTASSIUM: 4.1 mmol/L (ref 3.5–5.2)
Sodium: 134 mmol/L (ref 134–144)
TOTAL PROTEIN: 7.2 g/dL (ref 6.0–8.5)

## 2017-06-27 LAB — HEMOGLOBIN A1C
Est. average glucose Bld gHb Est-mCnc: 128 mg/dL
Hgb A1c MFr Bld: 6.1 % — ABNORMAL HIGH (ref 4.8–5.6)

## 2017-06-27 LAB — MICROALBUMIN / CREATININE URINE RATIO
Creatinine, Urine: 121.4 mg/dL
Microalb/Creat Ratio: 5 mg/g creat (ref 0.0–30.0)
Microalbumin, Urine: 6.1 ug/mL

## 2017-06-27 LAB — TSH: TSH: 1.18 u[IU]/mL (ref 0.450–4.500)

## 2017-06-27 LAB — HIV ANTIBODY (ROUTINE TESTING W REFLEX): HIV SCREEN 4TH GENERATION: NONREACTIVE

## 2017-08-09 MED FILL — NORTRIPTYLINE HCL 10 MG CAP: 10 | 30 days supply | Qty: 60 | Fill #0

## 2017-09-02 MED FILL — METFORMIN HCL ER 500 MG TAB: 500 | 90 days supply | Qty: 90 | Fill #0

## 2017-09-10 ENCOUNTER — Ambulatory Visit: Payer: No Typology Code available for payment source | Admitting: Neurology

## 2017-09-10 VITALS — BP 113/73 | HR 101 | Resp 16 | Ht 66.0 in | Wt 179.0 lb

## 2017-09-10 DIAGNOSIS — G43709 Chronic migraine without aura, not intractable, without status migrainosus: Secondary | ICD-10-CM | POA: Diagnosis not present

## 2017-09-10 DIAGNOSIS — IMO0002 Reserved for concepts with insufficient information to code with codable children: Secondary | ICD-10-CM

## 2017-09-10 MED ORDER — RIZATRIPTAN BENZOATE 10 MG PO TBDP: 10.0000 mg | ORAL_TABLET | ORAL | 11 refills | Status: DC | PRN

## 2017-09-10 MED ORDER — PROPRANOLOL HCL 40 MG PO TABS: 40.0000 mg | ORAL_TABLET | Freq: Two times a day (BID) | ORAL | 11 refills | Status: DC

## 2017-09-10 MED FILL — NORTRIPTYLINE HCL 10 MG CAP: 10 | 30 days supply | Qty: 60 | Fill #1

## 2017-09-10 MED FILL — PROPRANOLOL 40 MG TABLET: 40 | 30 days supply | Qty: 60 | Fill #0

## 2017-09-10 MED FILL — RIZATRIPTAN 10 MG ODT: 10 | 30 days supply | Qty: 12 | Fill #0

## 2017-09-10 NOTE — Progress Notes (Signed)
PATIENT: Rachel [name] DOB: 1975-08-23  Chief Complaint  Patient presents with  . Migraines    Rm. 4.  PCP: Harrison Mons, PA-C.  Sts. h/a's are about less frequent (about 50% less), same severity.  She stopped PRN Imitrex because it casued numbness in hands, heavy sensation in chest.  Has also started Vit. B12 and Magnesium and feels these help/fim     HISTORICAL  Rachel [name] is a 42 years old female, seen in refer by her primary care PA Harrison Mons for evaluation of worsening headaches, initial evaluation was on June 05, 2017.  She had a past medical history of hyperlipidemia, reported longtime chronic stress taking care of her special needs child.  She reported history of migraine headache in the past, increased frequency since 2018, especially since January 2019.  She also suffered holoacranial pounding headache with associated light noise sensitivity, nauseous, movement make her headache worse, she tends to lie down in dark quiet room, before January 2019, she has had a couple times a week, but since January, she has started on a daily basis, constant Botox area occipital area pressure pain, frequent exacerbations, chronic migraine headaches she has been taking daily Fioricet, ibuprofen 600 mg with suboptimal control of her headaches, headache last all day long.  She was given Topamax 50 mg every night, complains of slow thinking, GI side effects, there is no significant benefit noted.  She denies visual change, was seen by ophthalmologist in August 2018, no significant abnormality noted  UPDATE September 10 2017: Her headache had a moderate improvement, 50% less, but still severe, pounding with light noise sensitivity, she had a tried Imitrex twice, complains of chest pressure,   REVIEW OF SYSTEMS: Full 14 system review of systems performed and notable only for as above ALLERGIES: No Known Allergies  HOME MEDICATIONS: Current Outpatient Medications  Medication Sig  Dispense Refill  . atorvastatin (LIPITOR) 40 MG tablet Take 1 tablet (40 mg total) by mouth daily. 90 tablet 3  . clonazePAM (KLONOPIN) 0.5 MG tablet Take 1 tablet (0.5 mg total) by mouth 2 (two) times daily as needed for anxiety. 20 tablet 1  . fluticasone (FLONASE) 50 MCG/ACT nasal spray Place 2 sprays into both nostrils daily. 16 g 12  . glucose blood (FREESTYLE LITE) test strip Use once daily to check blood sugar 100 each 4  . glucose monitoring kit (FREESTYLE) monitoring kit 1 each by Does not apply route daily. 1 each 0  . ibuprofen (ADVIL,MOTRIN) 600 MG tablet Take 1 tablet (600 mg total) by mouth every 8 (eight) hours as needed. 30 tablet 0  . magnesium gluconate (MAGONATE) 500 MG tablet Take 500 mg by mouth 2 (two) times daily.    . metFORMIN (GLUCOPHAGE-XR) 500 MG 24 hr tablet Take 1 tablet (500 mg total) by mouth daily with breakfast. 90 tablet 3  . Multiple Vitamin (MULTIVITAMIN WITH MINERALS) TABS tablet Take 1 tablet by mouth daily.    . nortriptyline (PAMELOR) 10 MG capsule Take 2 capsules (20 mg total) by mouth at bedtime. 60 capsule 11  . ondansetron (ZOFRAN ODT) 4 MG disintegrating tablet Take 1 tablet (4 mg total) by mouth every 8 (eight) hours as needed. 20 tablet 6  . vitamin B-12 (CYANOCOBALAMIN) 100 MCG tablet Take 100 mcg by mouth daily.     No current facility-administered medications for this visit.     PAST MEDICAL HISTORY: Past Medical History:  Diagnosis Date  . Atypical chest pain 08/17/2015  . Familial hyperlipidemia  08/17/2015  . Orthostatic hypotension 08/17/2015  . PUD (peptic ulcer disease)     PAST SURGICAL HISTORY: Past Surgical History:  Procedure Laterality Date  . CESAREAN SECTION      FAMILY HISTORY: Family History  Problem Relation Age of Onset  . Spina bifida Son   . Hyperlipidemia Daughter   . Hyperlipidemia Mother   . Heart disease Sister   . Hyperlipidemia Sister   . Hyperlipidemia Other   . Breast cancer Paternal Aunt     SOCIAL  HISTORY:  Social History   Socioeconomic History  . Marital status: Married    Spouse name: Not on file  . Number of children: 2  . Years of education: Not on file  . Highest education level: Not on file  Occupational History  . 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 Financial resource strain: Not on file  . Food insecurity:    Worry: Not on file    Inability: Not on file  . Transportation needs:    Medical: Not on file    Non-medical: Not on file  Tobacco Use  . Smoking status: Never Smoker  . Smokeless tobacco: Never Used  Substance and Sexual Activity  . Alcohol use: No  . Drug use: No  . Sexual activity: Yes    Birth control/protection: Condom  Lifestyle  . Physical activity:    Days per week: Not on file    Minutes per session: Not on file  . Stress: Not on file  Relationships  . Social connections:    Talks on phone: Not on file    Gets together: Not on file    Attends religious service: Not on file    Active member of club or organization: Not on file    Attends meetings of clubs or organizations: Not on file    Relationship status: Not on file  . Intimate partner violence:    Fear of current or ex partner: Not on file    Emotionally abused: Not on file    Physically abused: Not on file    Forced sexual activity: Not on file  Other Topics Concern  . Not on file  Social History Narrative   Lives with her husband and their 2 children   Her son is wheelchair bound due to spina bifida     PHYSICAL EXAM   Vitals:   09/10/17 0723  BP: 113/73  Pulse: (!) 101  Resp: 16  Weight: 179 lb (81.2 kg)  Height: 5' 6" (1.676 m)    Not recorded      Body mass index is 28.89 kg/m.  PHYSICAL EXAMNIATION:  Gen: NAD, conversant, well nourised, obese, well groomed                     Cardiovascular: Regular rate rhythm, no peripheral edema, warm, nontender. Eyes: Conjunctivae clear without exudates or hemorrhage Neck: Supple, no carotid  bruits. Pulmonary: Clear to auscultation bilaterally   NEUROLOGICAL EXAM:  MENTAL STATUS: Speech:    Speech is normal; fluent and spontaneous with normal comprehension.  Cognition:     Orientation to time, place and person     Normal recent and remote memory     Normal Attention span and concentration     Normal Language, naming, repeating,spontaneous speech     Fund of knowledge   CRANIAL NERVES: CN II: Visual fields are full to confrontation. Fundoscopic exam is normal with sharp discs and no vascular  changes. Pupils are round equal and briskly reactive to light. CN III, IV, VI: extraocular movement are normal. No ptosis. CN V: Facial sensation is intact to pinprick in all 3 divisions bilaterally. Corneal responses are intact.  CN VII: Face is symmetric with normal eye closure and smile. CN VIII: Hearing is normal to rubbing fingers CN IX, X: Palate elevates symmetrically. Phonation is normal. CN XI: Head turning and shoulder shrug are intact CN XII: Tongue is midline with normal movements and no atrophy.  MOTOR: There is no pronator drift of out-stretched arms. Muscle bulk and tone are normal. Muscle strength is normal.  REFLEXES: Reflexes are 2+ and symmetric at the biceps, triceps, knees, and ankles. Plantar responses are flexor.  SENSORY: Intact to light touch, pinprick, positional sensation and vibratory sensation are intact in fingers and toes.  COORDINATION: Rapid alternating movements and fine finger movements are intact. There is no dysmetria on finger-to-nose and heel-knee-shin.    GAIT/STANCE: Posture is normal. Gait is steady with normal steps, base, arm swing, and turning. Heel and toe walking are normal. Tandem gait is normal.  Romberg is absent.   DIAGNOSTIC DATA (LABS, IMAGING, TESTING) - I reviewed patient records, labs, notes, testing and imaging myself where available.   ASSESSMENT AND PLAN  Rachel [name] is a 42 y.o. female   Chronic migraine  headaches  Likely a component of medicine rebound headache with daily Fioricet, ibuprofen use.  She reported 50% improvement with preventive medication nortriptyline 20 mg every night, noticed early morning dry mouth, metallic taste  previously tried Topamax not tolerated due to GI side effect, and mental slowing.  Add on Inderal 40 mg twice a day,   if she has suboptimal response to the above combination of nortriptyline and Inderal, may consider CGRP antagonist as preventive medications  She could not tolerate Imitrex due to chest pressure sensation, will try Maxalt 10 mg as needed       Marcial Pacas, M.D. Ph.D.  [facility] [address], [place], [place] [address] Ph: [phone] Fax: [phone]  CC: Harrison Mons, PA-C

## 2017-10-15 ENCOUNTER — Encounter: Payer: Self-pay | Admitting: Family Medicine

## 2017-10-15 ENCOUNTER — Ambulatory Visit (INDEPENDENT_AMBULATORY_CARE_PROVIDER_SITE_OTHER): Payer: No Typology Code available for payment source | Admitting: Family Medicine

## 2017-10-15 ENCOUNTER — Other Ambulatory Visit: Payer: Self-pay

## 2017-10-15 VITALS — BP 124/86 | HR 88 | Temp 98.7°F | Ht 66.0 in | Wt 181.8 lb

## 2017-10-15 DIAGNOSIS — E785 Hyperlipidemia, unspecified: Secondary | ICD-10-CM

## 2017-10-15 DIAGNOSIS — R7303 Prediabetes: Secondary | ICD-10-CM

## 2017-10-15 DIAGNOSIS — M545 Low back pain, unspecified: Secondary | ICD-10-CM

## 2017-10-15 MED ORDER — ATORVASTATIN CALCIUM 80 MG PO TABS: 80.0000 mg | ORAL_TABLET | Freq: Every day | ORAL | 1 refills | Status: DC

## 2017-10-15 MED ORDER — METHOCARBAMOL 500 MG PO TABS: 500.0000 mg | ORAL_TABLET | Freq: Four times a day (QID) | ORAL | 0 refills | Status: AC

## 2017-10-15 MED ORDER — METFORMIN HCL ER 750 MG PO TB24: 750.0000 mg | ORAL_TABLET | Freq: Every day | ORAL | 1 refills | Status: DC

## 2017-10-15 MED ORDER — TRAMADOL HCL 50 MG PO TABS: 50.0000 mg | ORAL_TABLET | Freq: Three times a day (TID) | ORAL | 0 refills | Status: DC | PRN

## 2017-10-15 MED ORDER — NAPROXEN 500 MG PO TBEC: 500.0000 mg | DELAYED_RELEASE_TABLET | Freq: Two times a day (BID) | ORAL | 0 refills | Status: DC

## 2017-10-15 NOTE — Progress Notes (Signed)
7/30/20195:33 PM  Rachel [name] 1975/09/06, 42 y.o. female [id number]  Chief Complaint  Patient presents with  . Back Pain    for the past 2 wks experiencing back pain due to lifting of heavy boxes. Using heat, and motrin to relieve pain with no resolve    HPI:   Patient is a 42 y.o. female with past medical history significant for prediabetes and HLP who presents today for 2 weeks of low back pain  2 weeks ago while moving, lifted heavy boxes, started having sharp stabbing pain in middle of back that radiates across her waistline She denies any radiation down her legs, focal weakness, numbness, tingling, changes in bowel or bladder function, fever or chills She is using naproxen 258m twice a day, alternating heat and ice, not much improvement She tried gentle stretching and too painful  Labs in April 2019 LDL 279, on atorvastatin 43m patient recalls having issues with crestor but cant recall what specifically A1c 6.1, on metformin XR 50047mfasting cbgs 130-140s  Fall Risk  10/15/2017 05/15/2017 12/28/2016 12/07/2016 01/18/2016  Falls in the past year? _0      [facility] 10/15/2017 05/15/2017 12/28/2016  Decreased Interest 0 0 0  Down, Depressed, Hopeless 0 0 0  PHQ - 2 Score 0 0 0    No Known Allergies  Prior to Admission medications   Medication Sig Start Date End Date Taking? Authorizing Provider  atorvastatin (LIPITOR) 40 MG tablet Take 1 tablet (40 mg total) by mouth daily. 12/07/16  Yes Jeffery, Chelle, PA-C  clonazePAM (KLONOPIN) 0.5 MG tablet Take 1 tablet (0.5 mg total) by mouth 2 (two) times daily as needed for anxiety. 06/26/17  Yes Jeffery, Chelle, PA-C  glucose blood (FREESTYLE LITE) test strip Use once daily to check blood sugar 12/18/16  Yes Jeffery, Chelle, PA-C  glucose monitoring kit (FREESTYLE) monitoring kit 1 each by Does not apply route daily. 12/18/16  Yes Jeffery, Chelle, PA-C  ibuprofen (ADVIL,MOTRIN) 600 MG tablet Take 1  tablet (600 mg total) by mouth every 8 (eight) hours as needed. 05/06/17  Yes English, SteColletta [place] PA  magnesium gluconate (MAGONATE) 500 MG tablet Take 500 mg by mouth 2 (two) times daily.   Yes [provider]  metFORMIN (GLUCOPHAGE-XR) 500 MG 24 hr tablet Take 1 tablet (500 mg total) by mouth daily with breakfast. 06/26/17  Yes Jeffery, Chelle, PA-C  Multiple Vitamin (MULTIVITAMIN WITH MINERALS) TABS tablet Take 1 tablet by mouth daily.   Yes [provider]  nortriptyline (PAMELOR) 10 MG capsule Take 2 capsules (20 mg total) by mouth at bedtime. 06/05/17  Yes YanMarcial PacasD  propranolol (INDERAL) 40 MG tablet Take 1 tablet (40 mg total) by mouth 2 (two) times daily. 09/10/17  Yes YanMarcial PacasD  rizatriptan (MAXALT-MLT) 10 MG disintegrating tablet Take 1 tablet (10 mg total) by mouth as needed for migraine. May repeat in 2 hours if needed 09/10/17  Yes YanMarcial PacasD  vitamin B-12 (CYANOCOBALAMIN) 100 MCG tablet Take 100 mcg by mouth daily.   Yes [provider]    Past Medical History:  Diagnosis Date  . Atypical chest pain 08/17/2015  . 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 Orthostatic hypotension 08/17/2015  . PUD (peptic ulcer disease)     Past Surgical History:  Procedure Laterality Date  . CESAREAN SECTION      Social History   Tobacco Use  . Smoking status: Never Smoker  . Smokeless tobacco: Never Used  Substance Use Topics  . Alcohol use: No    Family History  Problem Relation Age of Onset  . Spina bifida Son   . Hyperlipidemia Daughter   . Hyperlipidemia Mother   . Heart disease Sister   . Hyperlipidemia Sister   . Hyperlipidemia Other   . Breast cancer Paternal Aunt     ROS Per hpi  OBJECTIVE:  Blood pressure 124/86, pulse 88, temperature 98.7 F (37.1 C), temperature source Oral, height _0  (1.676 m), weight 181 lb 12.8 oz (82.5 kg), last menstrual period 09/17/2017, SpO2 98 %. Body mass index is 29.34 kg/m.   Wt Readings from  Last 3 Encounters:  10/15/17 181 lb 12.8 oz (82.5 kg)  09/10/17 179 lb (81.2 kg)  06/26/17 181 lb 12.8 oz (82.5 kg)    Physical Exam  Constitutional: She is oriented to person, place, and time.  HENT:  Head: Normocephalic and atraumatic.  Mouth/Throat: Mucous membranes are normal.  Eyes: Pupils are equal, round, and reactive to light. EOM are normal. No scleral icterus.  Neck: Neck supple.  Pulmonary/Chest: Effort normal.  Neurological: She is alert and oriented to person, place, and time.  Skin: Skin is warm and dry.  Nursing note and vitals reviewed.  Gen: AAOx3, NAD, obviously uncomfortable, guarded movements when moving to exam table Back: TTP along midline. No spasms noted. BLE reflexes +2 and symmetrical, strength 5/5.    ASSESSMENT and PLAN  1. Prediabetes Fasting above goal, increasing metformin. Continue with LFM. Recheck labs in 3 months. - Hemoglobin A1c; Future - metFORMIN (GLUCOPHAGE-XR) 750 MG 24 hr tablet; Take 1 tablet (750 mg total) by mouth daily with breakfast.  2. Hyperlipidemia, unspecified hyperlipidemia type Uncontrolled. Increasing atorvastatin. Consider zetia. Continue LFM. Recheck labs in 3 months - Lipid panel; Future - atorvastatin (LIPITOR) 80 MG tablet; Take 1 tablet (80 mg total) by mouth daily.  3. Acute midline low back pain without sciatica Discussed supportive measures, new meds r/se/b and RTC precautions. Patient educational handout given. Consider PT if no resolution over the next couple of weeks. - methocarbamol (ROBAXIN) 500 MG tablet; Take 1 tablet (500 mg total) by mouth 4 (four) times daily for 10 days. - traMADol (ULTRAM) 50 MG tablet; Take 1-2 tablets (50-100 mg total) by mouth every 8 (eight) hours as needed. - naproxen (EC NAPROSYN) 500 MG EC tablet; Take 1 tablet (500 mg total) by mouth 2 (two) times daily with a meal.  Return in about 3 months (around 01/15/2018).    Rutherford Guys, MD [facility] [place], [place] [address] Ph.  [phone] Fax [phone]

## 2017-10-15 NOTE — Patient Instructions (Addendum)
     IF you received an x-ray today, you will receive an invoice from [facility]. Please contact [facility] at [phone] with questions or concerns regarding your invoice.   IF you received labwork today, you will receive an invoice from [facility]. Please contact [facility] at [phone] with questions or concerns regarding your invoice.   Our billing staff will not be able to assist you with questions regarding bills from these companies.  You will be contacted with the lab results as soon as they are available. The fastest way to get your results is to activate your My Chart account. Instructions are located on the last page of this paperwork. If you have not heard from us regarding the results in 2 weeks, please contact this office.      Back Pain, Adult Back pain is very common. The pain often gets better over time. The cause of back pain is usually not dangerous. Most people can learn to manage their back pain on their own. Follow these instructions at home: Watch your back pain for any changes. The following actions may help to lessen any pain you are feeling:  Stay active. Start with short walks on flat ground if you can. Try to walk farther each day.  Exercise regularly as told by your doctor. Exercise helps your back heal faster. It also helps avoid future injury by keeping your muscles strong and flexible.  Do not sit, drive, or stand in one place for more than 30 minutes.  Do not stay in bed. Resting more than 1-2 days can slow down your recovery.  Be careful when you bend or lift an object. Use good form when lifting: ? Bend at your knees. ? Keep the object close to your body. ? Do not twist.  Sleep on a firm mattress. Lie on your side, and bend your knees. If you lie on your back, put a pillow under your knees.  Take medicines only as told by your doctor.  Put ice on the injured area. ? Put ice in a plastic bag. ? Place a towel between your  skin and the bag. ? Leave the ice on for 20 minutes, 2-3 times a day for the first 2-3 days. After that, you can switch between ice and heat packs.  Avoid feeling anxious or stressed. Find good ways to deal with stress, such as exercise.  Maintain a healthy weight. Extra weight puts stress on your back.  Contact a doctor if:  You have pain that does not go away with rest or medicine.  You have worsening pain that goes down into your legs or buttocks.  You have pain that does not get better in one week.  You have pain at night.  You lose weight.  You have a fever or chills. Get help right away if:  You cannot control when you poop (bowel movement) or pee (urinate).  Your arms or legs feel weak.  Your arms or legs lose feeling (numbness).  You feel sick to your stomach (nauseous) or throw up (vomit).  You have belly (abdominal) pain.  You feel like you may pass out (faint). This information is not intended to replace advice given to you by your health care provider. Make sure you discuss any questions you have with your health care provider. Document Released: 08/22/2007 Document Revised: 08/11/2015 Document Reviewed: 07/07/2013 Elsevier Interactive Patient Education  2018 [employer]  

## 2017-10-16 MED FILL — ATORVASTATIN 80 MG TABLET: 80 | 90 days supply | Qty: 90 | Fill #0

## 2017-10-16 MED FILL — NAPROXEN DR 500 MG TABLET: 500 | 30 days supply | Qty: 60 | Fill #0

## 2017-10-16 MED FILL — METFORMIN HCL ER 750 MG TAB: 750 | 90 days supply | Qty: 90 | Fill #0

## 2017-10-16 MED FILL — METHOCARBAMOL 500 MG TABS: 500 | 10 days supply | Qty: 40 | Fill #0

## 2017-10-16 MED FILL — traMADol HCL 50 MG TABS: 50 | 10 days supply | Qty: 30 | Fill #0

## 2017-10-18 MED FILL — NORTRIPTYLINE HCL 10 MG CAP: 10 | 30 days supply | Qty: 60 | Fill #2

## 2017-11-01 ENCOUNTER — Telehealth: Payer: Self-pay | Admitting: Family Medicine

## 2017-11-01 NOTE — Telephone Encounter (Signed)
Left a voicemail in regards to her appt she has with Dr. Leretha PolSantiago on 01/16/2018. The provider is leaving early and she would need to be rescheduled.

## 2017-11-15 ENCOUNTER — Emergency Department (HOSPITAL_COMMUNITY): Payer: No Typology Code available for payment source

## 2017-11-15 ENCOUNTER — Emergency Department (HOSPITAL_COMMUNITY)
Admission: EM | Admit: 2017-11-15 | Discharge: 2017-11-15 | Disposition: A | Discharge status: Home / Self Care | Payer: No Typology Code available for payment source | Attending: Emergency Medicine | Admitting: Emergency Medicine

## 2017-11-15 ENCOUNTER — Encounter (HOSPITAL_COMMUNITY): Payer: Self-pay | Admitting: Emergency Medicine

## 2017-11-15 DIAGNOSIS — S161XXA Strain of muscle, fascia and tendon at neck level, initial encounter: Secondary | ICD-10-CM | POA: Diagnosis not present

## 2017-11-15 DIAGNOSIS — Y92410 Unspecified street and highway as the place of occurrence of the external cause: Secondary | ICD-10-CM | POA: Insufficient documentation

## 2017-11-15 DIAGNOSIS — Y999 Unspecified external cause status: Secondary | ICD-10-CM | POA: Diagnosis not present

## 2017-11-15 DIAGNOSIS — Y939 Activity, unspecified: Secondary | ICD-10-CM | POA: Diagnosis not present

## 2017-11-15 DIAGNOSIS — Z79899 Other long term (current) drug therapy: Secondary | ICD-10-CM | POA: Diagnosis not present

## 2017-11-15 DIAGNOSIS — S199XXA Unspecified injury of neck, initial encounter: Secondary | ICD-10-CM | POA: Diagnosis present

## 2017-11-15 MED ORDER — CYCLOBENZAPRINE HCL 10 MG PO TABS
5.0000 mg | ORAL_TABLET | Freq: Once | ORAL | Status: AC
  Administered 2017-11-15: 5 mg via ORAL
  Filled 2017-11-15: qty 1

## 2017-11-15 MED ORDER — IBUPROFEN 800 MG PO TABS
800.0000 mg | ORAL_TABLET | Freq: Once | ORAL | Status: AC
  Administered 2017-11-15: 800 mg via ORAL
  Filled 2017-11-15: qty 1

## 2017-11-15 MED ORDER — METHOCARBAMOL 500 MG PO TABS: 500.0000 mg | ORAL_TABLET | Freq: Two times a day (BID) | ORAL | 0 refills | Status: DC
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
## 2017-11-15 NOTE — Discharge Instructions (Signed)
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
## 2017-11-19 ENCOUNTER — Encounter: Payer: Self-pay | Admitting: Family Medicine

## 2017-11-19 ENCOUNTER — Ambulatory Visit (INDEPENDENT_AMBULATORY_CARE_PROVIDER_SITE_OTHER): Payer: No Typology Code available for payment source | Admitting: Family Medicine

## 2017-11-19 VITALS — BP 110/64 | HR 110 | Temp 97.8°F | Resp 16 | Ht 66.0 in | Wt 180.2 lb

## 2017-11-19 DIAGNOSIS — M5442 Lumbago with sciatica, left side: Secondary | ICD-10-CM

## 2017-11-19 DIAGNOSIS — G44209 Tension-type headache, unspecified, not intractable: Secondary | ICD-10-CM

## 2017-11-19 DIAGNOSIS — M542 Cervicalgia: Secondary | ICD-10-CM

## 2017-11-19 DIAGNOSIS — S134XXA Sprain of ligaments of cervical spine, initial encounter: Secondary | ICD-10-CM | POA: Diagnosis not present

## 2017-11-19 DIAGNOSIS — S134XXD Sprain of ligaments of cervical spine, subsequent encounter: Secondary | ICD-10-CM

## 2017-11-19 NOTE — Patient Instructions (Addendum)
   If you have lab work done today you will be contacted with your lab results within the next 2 weeks.  If you have not heard from us then please contact us. The fastest way to get your results is to register for My Chart.   IF you received an x-ray today, you will receive an invoice from [facility]. Please contact [facility] at [phone] with questions or concerns regarding your invoice.   IF you received labwork today, you will receive an invoice from [facility]. Please contact [facility] at [phone] with questions or concerns regarding your invoice.   Our billing staff will not be able to assist you with questions regarding bills from these companies.  You will be contacted with the lab results as soon as they are available. The fastest way to get your results is to activate your My Chart account. Instructions are located on the last page of this paperwork. If you have not heard from us regarding the results in 2 weeks, please contact this office.     Sciatica Sciatica is pain, numbness, weakness, or tingling along the path of the sciatic nerve. The sciatic nerve starts in the lower back and runs down the back of each leg. The nerve controls the muscles in the lower leg and in the back of the knee. It also provides feeling (sensation) to the back of the thigh, the lower leg, and the sole of the foot. Sciatica is a symptom of another medical condition that pinches or puts pressure on the sciatic nerve. Generally, sciatica only affects one side of the body. Sciatica usually goes away on its own or with treatment. In some cases, sciatica may keep coming back (recur). What are the causes? This condition is caused by pressure on the sciatic nerve, or pinching of the sciatic nerve. This may be the result of:  A disk in between the bones of the spine (vertebrae) bulging out too far (herniated disk).  Age-related changes in the spinal disks (degenerative disk  disease).  A pain disorder that affects a muscle in the buttock (piriformis syndrome).  Extra bone growth (bone spur) near the sciatic nerve.  An injury or break (fracture) of the pelvis.  Pregnancy.  Tumor (rare).  What increases the risk? The following factors may make you more likely to develop this condition:  Playing sports that place pressure or stress on the spine, such as football or weight lifting.  Having poor strength and flexibility.  A history of back injury.  A history of back surgery.  Sitting for long periods of time.  Doing activities that involve repetitive bending or lifting.  Obesity.  What are the signs or symptoms? Symptoms can vary from mild to very severe, and they may include:  Any of these problems in the lower back, leg, hip, or buttock: ? Mild tingling or dull aches. ? Burning sensations. ? Sharp pains.  Numbness in the back of the calf or the sole of the foot.  Leg weakness.  Severe back pain that makes movement difficult.  These symptoms may get worse when you cough, sneeze, or laugh, or when you sit or stand for long periods of time. Being overweight may also make symptoms worse. In some cases, symptoms may recur over time. How is this diagnosed? This condition may be diagnosed based on:  Your symptoms.  A physical exam. Your health care provider may ask you to do certain movements to check whether those movements trigger your symptoms.    You may have tests, including: ? Blood tests. ? X-rays. ? MRI. ? CT scan.  How is this treated? In many cases, this condition improves on its own, without any treatment. However, treatment may include:  Reducing or modifying physical activity during periods of pain.  Exercising and stretching to strengthen your abdomen and improve the flexibility of your spine.  Icing and applying heat to the affected area.  Medicines that help: ? To relieve pain and swelling. ? To relax your  muscles.  Injections of medicines that help to relieve pain, irritation, and inflammation around the sciatic nerve (steroids).  Surgery.  Follow these instructions at home: Medicines  Take over-the-counter and prescription medicines only as told by your health care provider.  Do not drive or operate heavy machinery while taking prescription pain medicine. Managing pain  If directed, apply ice to the affected area. ? Put ice in a plastic bag. ? Place a towel between your skin and the bag. ? Leave the ice on for 20 minutes, 2-3 times a day.  After icing, apply heat to the affected area before you exercise or as often as told by your health care provider. Use the heat source that your health care provider recommends, such as a moist heat pack or a heating pad. ? Place a towel between your skin and the heat source. ? Leave the heat on for 20-30 minutes. ? Remove the heat if your skin turns bright red. This is especially important if you are unable to feel pain, heat, or cold. You may have a greater risk of getting burned. Activity  Return to your normal activities as told by your health care provider. Ask your health care provider what activities are safe for you. ? Avoid activities that make your symptoms worse.  Take brief periods of rest throughout the day. Resting in a lying or standing position is usually better than sitting to rest. ? When you rest for longer periods, mix in some mild activity or stretching between periods of rest. This will help to prevent stiffness and pain. ? Avoid sitting for long periods of time without moving. Get up and move around at least one time each hour.  Exercise and stretch regularly, as told by your health care provider.  Do not lift anything that is heavier than 10 lb (4.5 kg) while you have symptoms of sciatica. When you do not have symptoms, you should still avoid heavy lifting, especially repetitive heavy lifting.  When you lift objects,  always use proper lifting technique, which includes: ? Bending your knees. ? Keeping the load close to your body. ? Avoiding twisting. General instructions  Use good posture. ? Avoid leaning forward while sitting. ? Avoid hunching over while standing.  Maintain a healthy weight. Excess weight puts extra stress on your back and makes it difficult to maintain good posture.  Wear supportive, comfortable shoes. Avoid wearing high heels.  Avoid sleeping on a mattress that is too soft or too hard. A mattress that is firm enough to support your back when you sleep may help to reduce your pain.  Keep all follow-up visits as told by your health care provider. This is important. Contact a health care provider if:  You have pain that wakes you up when you are sleeping.  You have pain that gets worse when you lie down.  Your pain is worse than you have experienced in the past.  Your pain lasts longer than 4 weeks.  You experience unexplained weight   loss. Get help right away if:  You lose control of your bowel or bladder (incontinence).  You have: ? Weakness in your lower back, pelvis, buttocks, or legs that gets worse. ? Redness or swelling of your back. ? A burning sensation when you urinate. This information is not intended to replace advice given to you by your health care provider. Make sure you discuss any questions you have with your health care provider. Document Released: 02/27/2001 Document Revised: 08/09/2015 Document Reviewed: 11/12/2014 [employer] Interactive Patient Education  2018 [employer]  
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
## 2017-11-25 MED FILL — NORTRIPTYLINE HCL 10 MG CAP: 10 | 30 days supply | Qty: 60 | Fill #3

## 2017-12-04 ENCOUNTER — Ambulatory Visit (INDEPENDENT_AMBULATORY_CARE_PROVIDER_SITE_OTHER): Payer: No Typology Code available for payment source | Admitting: Physician Assistant

## 2017-12-04 ENCOUNTER — Encounter: Payer: Self-pay | Admitting: Physician Assistant

## 2017-12-04 VITALS — BP 114/78 | HR 105 | Temp 97.8°F | Resp 16 | Ht 66.0 in | Wt 180.0 lb

## 2017-12-04 DIAGNOSIS — Z789 Other specified health status: Secondary | ICD-10-CM

## 2017-12-04 DIAGNOSIS — M546 Pain in thoracic spine: Secondary | ICD-10-CM | POA: Diagnosis not present

## 2017-12-04 DIAGNOSIS — M542 Cervicalgia: Secondary | ICD-10-CM
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
## 2017-12-04 NOTE — Patient Instructions (Addendum)
  You will receive a phone call to schedule an appointment with Physical therapy.  See below for home care tips    Apply moist heat to the area. Wet a towel and wring it out so it is damp. Put it in the microwave for about 15-20 seconds - long enough to make it hot, but not too hot to apply to your skin causing burns. Do this for about 20-30 minutes, 3-4 times a day.   Perform gentle, light stretches 2-3 times a day.   Put a tennis ball between your back and a wall. Gentle massage the area with rolling the ball around the affected area. Try a foam roller or trigger point back massager.   Stay well hydrated - try to drink 32-64 oz/day.   If you feel like you need a new pillow, try "My Pillow".  Thank you for coming in today. I hope you feel we met your needs.  Feel free to call PCP if you have any questions or further requests.  Please consider signing up for MyChart if you do not already have it, as this is a great way to communicate with me.  Best,  [employer], PA-C

## 2017-12-19 ENCOUNTER — Encounter: Payer: Self-pay | Admitting: Physical Therapy

## 2017-12-19 ENCOUNTER — Other Ambulatory Visit: Payer: Self-pay

## 2017-12-19 ENCOUNTER — Ambulatory Visit: Payer: No Typology Code available for payment source | Attending: Physician Assistant | Admitting: Physical Therapy

## 2017-12-19 DIAGNOSIS — R208 Other disturbances of skin sensation: Secondary | ICD-10-CM | POA: Diagnosis present

## 2017-12-19 DIAGNOSIS — M6281 Muscle weakness (generalized): Secondary | ICD-10-CM | POA: Insufficient documentation

## 2017-12-19 DIAGNOSIS — R293 Abnormal posture: Secondary | ICD-10-CM | POA: Diagnosis present

## 2017-12-19 DIAGNOSIS — M542 Cervicalgia: Secondary | ICD-10-CM | POA: Insufficient documentation
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
## 2017-12-23 ENCOUNTER — Ambulatory Visit: Payer: No Typology Code available for payment source | Admitting: Physician Assistant

## 2017-12-24 ENCOUNTER — Ambulatory Visit: Payer: No Typology Code available for payment source | Admitting: Neurology

## 2017-12-24 ENCOUNTER — Encounter: Payer: Self-pay | Admitting: Neurology

## 2017-12-24 VITALS — BP 128/81 | HR 98 | Ht 66.0 in | Wt 182.0 lb

## 2017-12-24 DIAGNOSIS — IMO0002 Reserved for concepts with insufficient information to code with codable children: Secondary | ICD-10-CM

## 2017-12-24 DIAGNOSIS — G43709 Chronic migraine without aura, not intractable, without status migrainosus: Secondary | ICD-10-CM

## 2017-12-24 DIAGNOSIS — F419 Anxiety disorder, unspecified: Secondary | ICD-10-CM | POA: Diagnosis not present

## 2017-12-24 MED ORDER — ONDANSETRON 4 MG PO TBDP: 4.0000 mg | ORAL_TABLET | Freq: Three times a day (TID) | ORAL | 6 refills | Status: DC | PRN

## 2017-12-24 MED ORDER — VENLAFAXINE HCL ER 37.5 MG PO CP24: 37.5000 mg | ORAL_CAPSULE | Freq: Every day | ORAL | 11 refills | Status: DC
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
## 2017-12-24 NOTE — Patient Instructions (Signed)
You may mix  Maxalt together with Aleve and zofran.

## 2018-01-01 ENCOUNTER — Encounter: Payer: Self-pay | Admitting: Physical Therapy

## 2018-01-01 ENCOUNTER — Ambulatory Visit: Payer: No Typology Code available for payment source | Admitting: Physical Therapy

## 2018-01-01 DIAGNOSIS — M542 Cervicalgia: Secondary | ICD-10-CM | POA: Diagnosis not present

## 2018-01-01 DIAGNOSIS — M6281 Muscle weakness (generalized): Secondary | ICD-10-CM

## 2018-01-01 DIAGNOSIS — R293 Abnormal posture: Secondary | ICD-10-CM

## 2018-01-01 NOTE — Therapy (Signed)
[facility] Outpatient Rehabilitation [facility] [address] [place], [place], [address] Phone: [phone]   Fax:  [phone]  Physical Therapy Treatment  Patient Details  Name: Rachel [name] MRN: [id number] Date of Birth: 01/16/1976 Referring Provider (PT): Lucretia Roers, [place] Ernesto Rutherford)  (Dr. Leretha Pol )   Encounter Date: 01/01/2018  PT End of Session - 01/01/18 1327    Visit Number  2    Number of Visits  16    PT Start Time  1330    PT Stop Time  1422    PT Time Calculation (min)  52 min    Activity Tolerance  Patient tolerated treatment well    Behavior During Therapy  [facility] for tasks assessed/performed       Past Medical History:  Diagnosis Date  . Atypical chest pain 08/17/2015  . 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 Orthostatic hypotension 08/17/2015  . PUD (peptic ulcer disease)     Past Surgical History:  Procedure Laterality Date  . CESAREAN SECTION      There were no vitals filed for this visit.  Subjective Assessment - 01/01/18 1332    Subjective  Was pretty sore after doing exercies    Patient Stated Goals  Patient would like to get pain relief, learn what I need to do     Currently in Pain?  Yes    Pain Score  6     Pain Location  Neck    Pain Orientation  Left    Pain Descriptors / Indicators  Sore                       [facility] Adult PT Treatment/Exercise - 01/01/18 0001      Exercises   Exercises  Neck      Neck Exercises: Supine   Neck Retraction  5 reps;5 secs    Cervical Rotation  Both;5 reps    Other Supine Exercise  belly breathing      Moist Heat Therapy   Number Minutes Moist Heat  10 Minutes    Moist Heat Location  Shoulder   Lt     Manual Therapy   Manual Therapy  Soft tissue mobilization;Manual Traction    Soft tissue mobilization  bil upper trap, suboccipitals    Manual Traction  cervical sustained holds 3x1 min      Neck Exercises: Stretches   Upper Trapezius Stretch  Right;Left;2 reps;20  seconds    Chest Stretch  2 reps;20 seconds    Chest Stretch Limitations  supine T       Trigger Point Dry Needling - 01/01/18 1352    Consent Given?  Yes    Education Handout Provided  --   verbal education   Muscles Treated Upper Body  Upper trapezius   Lt   Upper Trapezius Response  Twitch reponse elicited;Palpable increased muscle length           PT Education - 01/01/18 1413    Education Details  TPDN & expected outcomes, importance of posture & relaxing throughout the day    Person(s) Educated  Patient    Methods  Explanation;Demonstration;Tactile cues;Verbal cues;Handout    Comprehension  Verbalized understanding;Returned demonstration;Verbal cues required;Tactile cues required;Need further instruction       PT Short Term Goals - 12/19/17 1513      PT SHORT TERM GOAL #1   Title  Pt will be I with HEP for cervical spine ROM and stabilization     Time  4  Period  Weeks    Status  New    Target Date  01/16/18      PT SHORT TERM GOAL #2   Title  Pt will be able to sit comfortably for meals, driving, 30 min , ZOXW<9/60     Time  4    Period  Weeks    Status  New        PT Long Term Goals - 12/19/17 1514      PT LONG TERM GOAL #1   Title  Pt will be able to raise arms overhead without neck pain, tension     Time  8    Period  Weeks    Status  New    Target Date  02/13/18      PT LONG TERM GOAL #2   Title  Pt will demo 5/5 strength in bilateral UEs without neck pain for return to full functional mobility.     Time  8    Period  Weeks    Status  New    Target Date  02/13/18      PT LONG TERM GOAL #3   Title  Pt will be able to assist her son with ADLs, mobility and min occasional pain in neck    Time  8    Period  Weeks    Status  New    Target Date  02/13/18      PT LONG TERM GOAL #4   Title  Pt will be I with more advanced HEP at last visit    Time  8    Period  Weeks    Status  New    Target Date  02/13/18      PT LONG TERM GOAL #5    Title  Pt will be able to work on computer, do housework without increase in pain, fatigue.     Time  8    Period  Weeks    Status  New    Target Date  02/13/18            Plan - 01/01/18 1414    Clinical Impression Statement  Significant spasm noted in upper traps- Lt more than Rt- and decreased with DN today. Pt reported soreness as expected but wanted to wait until next appointment to continue with DN.     PT Treatment/Interventions  ADLs/Self Care Home Management;Electrical Stimulation;Functional mobility training;Neuromuscular re-education;Taping;Therapeutic activities;Cryotherapy;Traction;Ultrasound;Moist Heat;Therapeutic exercise;Patient/family education;Manual techniques;Passive range of motion;Dry needling;Iontophoresis 4mg /ml Dexamethasone    PT Next Visit Plan  DN upper traps, suboccipitals; supine band    PT Home Exercise Plan  chin tuck, extension, rotation, upper trap & levator stretch, scapular retraction, supine T, scap retraction, belly breathing       Patient will benefit from skilled therapeutic intervention in order to improve the following deficits and impairments:  Decreased mobility, Hypomobility, Increased muscle spasms, Impaired sensation, Decreased range of motion, Decreased strength, Increased fascial restricitons, Impaired flexibility, Impaired UE functional use, Postural dysfunction, Pain  Visit Diagnosis: Cervicalgia  Abnormal posture  Muscle weakness (generalized)     Problem List Patient Active Problem List   Diagnosis Date Noted  . Anxiety 12/24/2017  . Adult situational stress disorder 06/26/2017  . Chronic migraine 12/28/2016  . Prediabetes 12/12/2016  . BMI 25.0-25.9,adult 11/01/2015  . Insomnia 11/01/2015  . Orthostatic hypotension 08/17/2015  . 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 Atypical chest pain 08/17/2015  . Hyperlipidemia 07/24/2015    Denim Start C. Jarrick Fjeld PT, DPT 01/01/18 2:17  PM   [facility] Outpatient  Rehabilitation [facility] [address] [place], [place], [address] Phone: [phone]   Fax:  [phone]  Name: Rachel [name] MRN: [id number] Date of Birth: 06-17-1975

## 2018-01-03 ENCOUNTER — Ambulatory Visit: Payer: No Typology Code available for payment source | Admitting: Physical Therapy

## 2018-01-07 ENCOUNTER — Ambulatory Visit: Payer: No Typology Code available for payment source | Admitting: Physical Therapy

## 2018-01-07 DIAGNOSIS — M542 Cervicalgia: Secondary | ICD-10-CM | POA: Diagnosis not present

## 2018-01-07 DIAGNOSIS — R293 Abnormal posture: Secondary | ICD-10-CM

## 2018-01-07 DIAGNOSIS — M6281 Muscle weakness (generalized): Secondary | ICD-10-CM

## 2018-01-07 DIAGNOSIS — R208 Other disturbances of skin sensation: Secondary | ICD-10-CM

## 2018-01-07 NOTE — Therapy (Signed)
[facility] Outpatient Rehabilitation [facility] [address] [place], [place], [address] Phone: [phone]   Fax:  [phone]  Physical Therapy Treatment  Patient Details  Name: Rachel [name] MRN: [id number] Date of Birth: 1976/01/12 Referring Provider (PT): Lucretia Roers, [place] Ernesto Rutherford)  (Dr. Leretha Pol )   Encounter Date: 01/07/2018  PT End of Session - 01/07/18 1131    Visit Number  3    Number of Visits  16    PT Start Time  1105    PT Stop Time  1151    PT Time Calculation (min)  46 min    Activity Tolerance  Patient tolerated treatment well    Behavior During Therapy  [facility] for tasks assessed/performed       Past Medical History:  Diagnosis Date  . Atypical chest pain 08/17/2015  . 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 Orthostatic hypotension 08/17/2015  . PUD (peptic ulcer disease)     Past Surgical History:  Procedure Laterality Date  . CESAREAN SECTION      There were no vitals filed for this visit.  Subjective Assessment - 01/07/18 1107    Subjective  Sore this AM.  The dry needling helped.  It twitches a little bit now and that wasnt like that before.     Currently in Pain?  Yes    Pain Score  5     Pain Orientation  Left;Right    Pain Descriptors / Indicators  Aching    Pain Type  Acute pain    Pain Onset  More than a month ago    Pain Frequency  Intermittent    Aggravating Factors   sitting, lifting     Pain Relieving Factors  rest, hea, stretching             [facility] Adult PT Treatment/Exercise - 01/07/18 0001      Neck Exercises: Theraband   Scapula Retraction  10 reps      Neck Exercises: Supine   Neck Retraction  5 reps;5 secs      Shoulder Exercises: Supine   Horizontal ABduction  Strengthening;Both;10 reps    Theraband Level (Shoulder Horizontal ABduction)  Level 1 (Yellow)    Horizontal ABduction Weight (lbs)  better in sitting     External Rotation  Strengthening;Both;10 reps    Theraband Level (Shoulder External  Rotation)  Level 1 (Yellow)    Flexion  Strengthening;Both;10 reps    Theraband Level (Shoulder Flexion)  Level 1 (Yellow)    Shoulder Flexion Weight (lbs)  narrow grip supine     Diagonals  Strengthening;Both;10 reps    Theraband Level (Shoulder Diagonals)  Level 1 (Yellow)      Shoulder Exercises: Seated   Horizontal ABduction  Strengthening;Both;10 reps    Theraband Level (Shoulder Horizontal ABduction)  Level 1 (Yellow)      Shoulder Exercises: ROM/Strengthening   UBE (Upper Arm Bike)  5 min L1 corrected posture       Manual Therapy   Soft tissue mobilization  bil upper trap, levator scap, suboccipitals.  Used IASTM combined withmanual , trigger point release to bilateral levator scap, post cuff/teres minor       Neck Exercises: Stretches   Upper Trapezius Stretch  Right;Left;2 reps;20 seconds    Levator Stretch  Right;Left;2 reps    Chest Stretch Limitations  supine T over foam roller              PT Education - 01/07/18 1201    Education Details  posture,  shoulder elevation and scapular neutral     Person(s) Educated  Patient    Methods  Explanation    Comprehension  Verbalized understanding       PT Short Term Goals - 01/07/18 1131      PT SHORT TERM GOAL #1   Title  Pt will be I with HEP for cervical spine ROM and stabilization     Status  On-going      PT SHORT TERM GOAL #2   Title  Pt will be able to sit comfortably for meals, driving, 30 min , YTKZ<6/01     Status  On-going        PT Long Term Goals - 12/19/17 1514      PT LONG TERM GOAL #1   Title  Pt will be able to raise arms overhead without neck pain, tension     Time  8    Period  Weeks    Status  New    Target Date  02/13/18      PT LONG TERM GOAL #2   Title  Pt will demo 5/5 strength in bilateral UEs without neck pain for return to full functional mobility.     Time  8    Period  Weeks    Status  New    Target Date  02/13/18      PT LONG TERM GOAL #3   Title  Pt will be able to  assist her son with ADLs, mobility and min occasional pain in neck    Time  8    Period  Weeks    Status  New    Target Date  02/13/18      PT LONG TERM GOAL #4   Title  Pt will be I with more advanced HEP at last visit    Time  8    Period  Weeks    Status  New    Target Date  02/13/18      PT LONG TERM GOAL #5   Title  Pt will be able to work on computer, do housework without increase in pain, fatigue.     Time  8    Period  Weeks    Status  New    Target Date  02/13/18            Plan - 01/07/18 1201    Clinical Impression Statement  Patient with heightened neck and upper back tension, stress.  Utilized foam roller with min pain in L levator scap. Manual to address muscular tension.  Relief post, no time for [facility].  Pt prefers private room      PT Treatment/Interventions  ADLs/Self Care Home Management;Electrical Stimulation;Functional mobility training;Neuromuscular re-education;Taping;Therapeutic activities;Cryotherapy;Traction;Ultrasound;Moist Heat;Therapeutic exercise;Patient/family education;Manual techniques;Passive range of motion;Dry needling;Iontophoresis 4mg /ml Dexamethasone    PT Next Visit Plan  DN/manual upper traps, suboccipitals; supine band,    PT Home Exercise Plan  chin tuck, extension, rotation, upper trap & levator stretch, scapular retraction, supine T, scap retraction, belly breathing    Consulted and Agree with Plan of Care  Patient       Patient will benefit from skilled therapeutic intervention in order to improve the following deficits and impairments:  Decreased mobility, Hypomobility, Increased muscle spasms, Impaired sensation, Decreased range of motion, Decreased strength, Increased fascial restricitons, Impaired flexibility, Impaired UE functional use, Postural dysfunction, Pain  Visit Diagnosis: Cervicalgia  Abnormal posture  Muscle weakness (generalized)  Other disturbances of skin sensation     Problem  List Patient Active Problem  List   Diagnosis Date Noted  . Anxiety 12/24/2017  . Adult situational stress disorder 06/26/2017  . Chronic migraine 12/28/2016  . Prediabetes 12/12/2016  . BMI 25.0-25.9,adult 11/01/2015  . Insomnia 11/01/2015  . Orthostatic hypotension 08/17/2015  . 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 Atypical chest pain 08/17/2015  . Hyperlipidemia 07/24/2015    PAA,JENNIFER 01/07/2018, 12:04 PM  [facility] Outpatient [facility] [address] [place], [place], [address] Phone: [phone]   Fax:  [phone]  Name: Rachel [name] MRN: [id number] Date of Birth: 1976/02/28  Karie Mainland, PT 01/07/18 12:04 PM Phone: [phone] Fax: [phone]

## 2018-01-09 ENCOUNTER — Encounter: Payer: Self-pay | Admitting: Physical Therapy

## 2018-01-09 ENCOUNTER — Ambulatory Visit: Payer: No Typology Code available for payment source | Admitting: Physical Therapy

## 2018-01-09 DIAGNOSIS — R208 Other disturbances of skin sensation: Secondary | ICD-10-CM

## 2018-01-09 DIAGNOSIS — M542 Cervicalgia: Secondary | ICD-10-CM

## 2018-01-09 DIAGNOSIS — R293 Abnormal posture: Secondary | ICD-10-CM

## 2018-01-09 DIAGNOSIS — M6281 Muscle weakness (generalized): Secondary | ICD-10-CM

## 2018-01-09 NOTE — Therapy (Signed)
[facility] Outpatient Rehabilitation [facility] [address] [place], [place], [address] Phone: [phone]   Fax:  [phone]  Physical Therapy Treatment  Patient Details  Name: Rachel [name] MRN: [id number] Date of Birth: December 30, 1975 Referring Provider (PT): Lucretia Roers, [place] Ernesto Rutherford)  (Dr. Leretha Pol )   Encounter Date: 01/09/2018  PT End of Session - 01/09/18 1311    Visit Number  4    Number of Visits  16    PT Start Time  1105    PT Stop Time  1200    PT Time Calculation (min)  55 min    Activity Tolerance  Patient tolerated treatment well    Behavior During Therapy  [facility] for tasks assessed/performed       Past Medical History:  Diagnosis Date  . Atypical chest pain 08/17/2015  . 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 Orthostatic hypotension 08/17/2015  . PUD (peptic ulcer disease)     Past Surgical History:  Procedure Laterality Date  . CESAREAN SECTION      There were no vitals filed for this visit.  Subjective Assessment - 01/09/18 1108    Subjective  I think I overdid it this AM.  Felt a shooting pain down my upper back.      Currently in Pain?  Yes    Pain Score  6     Pain Location  Neck    Pain Orientation  Left    Pain Descriptors / Indicators  Aching    Pain Type  Acute pain    Pain Onset  More than a month ago             [facility] Adult PT Treatment/Exercise - 01/09/18 0001      Neck Exercises: Supine   Neck Retraction  10 reps;5 secs    Neck Retraction Limitations  neutral then with rotation     Cervical Rotation  Both;15 reps    Other Supine Exercise  neck resting on soft ball     Other Supine Exercise  goal post arms mobility       Shoulder Exercises: Supine   Horizontal ABduction  Strengthening;Both;10 reps;Theraband    Theraband Level (Shoulder Horizontal ABduction)  Level 2 (Red)    Horizontal ABduction Weight (lbs)  one arm at a time     External Rotation  Strengthening;Both;10 reps;Theraband    Theraband Level  (Shoulder External Rotation)  Level 2 (Red)    Flexion  Strengthening;Both;10 reps    Theraband Level (Shoulder Flexion)  Level 2 (Red)    Shoulder Flexion Weight (lbs)  narrow grip supine       Shoulder Exercises: Sidelying   Other Sidelying Exercises  thoracic: upper trunk rotation x 5       Moist Heat Therapy   Number Minutes Moist Heat  10 Minutes    Moist Heat Location  Cervical      Manual Therapy   Manual Therapy  Joint mobilization;Scapular mobilization    Soft tissue mobilization  prone levator scapulae, uppoer trap, trigger point release, periscapular    Scapular Mobilization  gentle, incrreased ROM post                PT Short Term Goals - 01/07/18 1131      PT SHORT TERM GOAL #1   Title  Pt will be I with HEP for cervical spine ROM and stabilization     Status  On-going      PT SHORT TERM GOAL #2   Title  Pt will be able to sit comfortably for meals, driving, 30 min , ZOXW<9/60     Status  On-going        PT Long Term Goals - 12/19/17 1514      PT LONG TERM GOAL #1   Title  Pt will be able to raise arms overhead without neck pain, tension     Time  8    Period  Weeks    Status  New    Target Date  02/13/18      PT LONG TERM GOAL #2   Title  Pt will demo 5/5 strength in bilateral UEs without neck pain for return to full functional mobility.     Time  8    Period  Weeks    Status  New    Target Date  02/13/18      PT LONG TERM GOAL #3   Title  Pt will be able to assist her son with ADLs, mobility and min occasional pain in neck    Time  8    Period  Weeks    Status  New    Target Date  02/13/18      PT LONG TERM GOAL #4   Title  Pt will be I with more advanced HEP at last visit    Time  8    Period  Weeks    Status  New    Target Date  02/13/18      PT LONG TERM GOAL #5   Title  Pt will be able to work on computer, do housework without increase in pain, fatigue.     Time  8    Period  Weeks    Status  New    Target Date  02/13/18             Plan - 01/09/18 1312    Clinical Impression Statement  Patient continues with tension in neck and upper back.  Manual painful for her but effective.  Pain is becoming less constant and less intense overall.      PT Treatment/Interventions  ADLs/Self Care Home Management;Electrical Stimulation;Functional mobility training;Neuromuscular re-education;Taping;Therapeutic activities;Cryotherapy;Traction;Ultrasound;Moist Heat;Therapeutic exercise;Patient/family education;Manual techniques;Passive range of motion;Dry needling;Iontophoresis 4mg /ml Dexamethasone    PT Next Visit Plan  DN/manual upper traps, suboccipitals; supine band,, goal check     PT Home Exercise Plan  chin tuck, extension, rotation, upper trap & levator stretch, scapular retraction, supine T, scap retraction, belly breathing    Consulted and Agree with Plan of Care  Patient       Patient will benefit from skilled therapeutic intervention in order to improve the following deficits and impairments:  Decreased mobility, Hypomobility, Increased muscle spasms, Impaired sensation, Decreased range of motion, Decreased strength, Increased fascial restricitons, Impaired flexibility, Impaired UE functional use, Postural dysfunction, Pain  Visit Diagnosis: Cervicalgia  Abnormal posture  Muscle weakness (generalized)  Other disturbances of skin sensation     Problem List Patient Active Problem List   Diagnosis Date Noted  . Anxiety 12/24/2017  . Adult situational stress disorder 06/26/2017  . Chronic migraine 12/28/2016  . Prediabetes 12/12/2016  . BMI 25.0-25.9,adult 11/01/2015  . Insomnia 11/01/2015  . Orthostatic hypotension 08/17/2015  . 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 Atypical chest pain 08/17/2015  . Hyperlipidemia 07/24/2015    PAA,JENNIFER 01/09/2018, 1:14 PM  [facility] [address] [place], [place], [address] Phone: [phone]   Fax:   [phone]  Name: Dillyn Joaquin MRN: [id number]  Date of Birth: 1975/05/10   Karie Mainland, PT 01/09/18 1:14 PM Phone: [phone] Fax: [phone]

## 2018-01-14 ENCOUNTER — Encounter: Payer: Self-pay | Admitting: Physical Therapy

## 2018-01-14 ENCOUNTER — Ambulatory Visit: Payer: No Typology Code available for payment source | Admitting: Physical Therapy

## 2018-01-14 DIAGNOSIS — M542 Cervicalgia: Secondary | ICD-10-CM | POA: Diagnosis not present

## 2018-01-14 DIAGNOSIS — R293 Abnormal posture: Secondary | ICD-10-CM

## 2018-01-14 DIAGNOSIS — M6281 Muscle weakness (generalized): Secondary | ICD-10-CM

## 2018-01-14 NOTE — Therapy (Signed)
[facility] Outpatient Rehabilitation [facility] [address] [place], [place], [address] Phone: [phone]   Fax:  [phone]  Physical Therapy Treatment  Patient Details  Name: Rachel [name] MRN: [id number] Date of Birth: April 26, 1975 Referring Provider (PT): Lucretia Roers, [place] Ernesto Rutherford)  (Dr. Leretha Pol )   Encounter Date: 01/14/2018  PT End of Session - 01/14/18 1106    Visit Number  5    Number of Visits  16    PT Start Time  1102    PT Stop Time  1153    PT Time Calculation (min)  51 min    Activity Tolerance  Patient tolerated treatment well    Behavior During Therapy  [facility] for tasks assessed/performed       Past Medical History:  Diagnosis Date  . Atypical chest pain 08/17/2015  . 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 Orthostatic hypotension 08/17/2015  . PUD (peptic ulcer disease)     Past Surgical History:  Procedure Laterality Date  . CESAREAN SECTION      There were no vitals filed for this visit.  Subjective Assessment - 01/14/18 1106    Subjective  Was sore and had some twitching after DN last time but slept well. Feeling most sore around shoulder blades and post/lat aspect of cervical region equal bil.     Patient Stated Goals  Patient would like to get pain relief, learn what I need to do                        [facility] Adult PT Treatment/Exercise - 01/14/18 0001      Exercises   Exercises  Shoulder      Shoulder Exercises: Supine   Horizontal ABduction  10 reps    Theraband Level (Shoulder Horizontal ABduction)  Level 2 (Red)    Flexion  Strengthening    Theraband Level (Shoulder Flexion)  Level 2 (Red)    Shoulder Flexion Weight (lbs)  narrow grip abd pull on band    Diagonals  Strengthening;10 reps   both diagonals     Shoulder Exercises: Standing   Other Standing Exercises  flexion to wall, thumbs touching with inhale      Shoulder Exercises: ROM/Strengthening   UBE (Upper Arm Bike)  3 min retro L1      Shoulder  Exercises: Stretch   Wall Stretch - Flexion Limitations  holding for 3 deep breaths    Table Stretch -Flexion Limitations  3 deep breaths holds    Other Shoulder Stretches  door pec stretch      Moist Heat Therapy   Number Minutes Moist Heat  10 Minutes    Moist Heat Location  Cervical      Manual Therapy   Manual Therapy  Joint mobilization    Manual therapy comments  skilled palpation & monitoring during TPDN    Joint Mobilization  gross rib ER & thoracic PA, bil first rib depression       Trigger Point Dry Needling - 01/14/18 1124    Muscles Treated Upper Body  Levator scapulae   thoracic paraspinals T4-5   Levator Scapulae Response  Twitch response elicited;Palpable increased muscle length             PT Short Term Goals - 01/07/18 1131      PT SHORT TERM GOAL #1   Title  Pt will be I with HEP for cervical spine ROM and stabilization     Status  On-going  PT SHORT TERM GOAL #2   Title  Pt will be able to sit comfortably for meals, driving, 30 min , ZOXW<9/60     Status  On-going        PT Long Term Goals - 12/19/17 1514      PT LONG TERM GOAL #1   Title  Pt will be able to raise arms overhead without neck pain, tension     Time  8    Period  Weeks    Status  New    Target Date  02/13/18      PT LONG TERM GOAL #2   Title  Pt will demo 5/5 strength in bilateral UEs without neck pain for return to full functional mobility.     Time  8    Period  Weeks    Status  New    Target Date  02/13/18      PT LONG TERM GOAL #3   Title  Pt will be able to assist her son with ADLs, mobility and min occasional pain in neck    Time  8    Period  Weeks    Status  New    Target Date  02/13/18      PT LONG TERM GOAL #4   Title  Pt will be I with more advanced HEP at last visit    Time  8    Period  Weeks    Status  New    Target Date  02/13/18      PT LONG TERM GOAL #5   Title  Pt will be able to work on computer, do housework without increase in pain,  fatigue.     Time  8    Period  Weeks    Status  New    Target Date  02/13/18            Plan - 01/14/18 1254    Clinical Impression Statement  Concordant pain found in thoracic paraspinals and rt levator scapula addressed with DN. Utilized deep breathing with stretches and exercises today for thoracic/rib cage mobility.     PT Treatment/Interventions  ADLs/Self Care Home Management;Electrical Stimulation;Functional mobility training;Neuromuscular re-education;Taping;Therapeutic activities;Cryotherapy;Traction;Ultrasound;Moist Heat;Therapeutic exercise;Patient/family education;Manual techniques;Passive range of motion;Dry needling;Iontophoresis 4mg /ml Dexamethasone    PT Next Visit Plan  DN PRN, prone shoulder exercises    PT Home Exercise Plan  chin tuck, extension, rotation, upper trap & levator stretch, scapular retraction, supine T, scap retraction, belly breathing    Consulted and Agree with Plan of Care  Patient       Patient will benefit from skilled therapeutic intervention in order to improve the following deficits and impairments:  Decreased mobility, Hypomobility, Increased muscle spasms, Impaired sensation, Decreased range of motion, Decreased strength, Increased fascial restricitons, Impaired flexibility, Impaired UE functional use, Postural dysfunction, Pain  Visit Diagnosis: Cervicalgia  Abnormal posture  Muscle weakness (generalized)     Problem List Patient Active Problem List   Diagnosis Date Noted  . Anxiety 12/24/2017  . Adult situational stress disorder 06/26/2017  . Chronic migraine 12/28/2016  . Prediabetes 12/12/2016  . BMI 25.0-25.9,adult 11/01/2015  . Insomnia 11/01/2015  . Orthostatic hypotension 08/17/2015  . 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 Atypical chest pain 08/17/2015  . Hyperlipidemia 07/24/2015   Jaidynn Balster C. Johnatan Baskette PT, DPT 01/14/18 1:03 PM   [facility] Outpatient Rehabilitation [facility] [address] [place], [place], [address] Phone: [phone]   Fax:  [phone]  Name: Gwenn Teodoro MRN:  [id number] Date of Birth: 05/29/1975

## 2018-01-16 ENCOUNTER — Ambulatory Visit: Payer: No Typology Code available for payment source | Admitting: Family Medicine

## 2018-01-16 ENCOUNTER — Encounter: Payer: Self-pay | Admitting: Physical Therapy

## 2018-01-16 ENCOUNTER — Ambulatory Visit: Payer: No Typology Code available for payment source | Admitting: Physical Therapy

## 2018-01-16 DIAGNOSIS — M542 Cervicalgia: Secondary | ICD-10-CM | POA: Diagnosis not present

## 2018-01-16 DIAGNOSIS — M6281 Muscle weakness (generalized): Secondary | ICD-10-CM

## 2018-01-16 DIAGNOSIS — R293 Abnormal posture: Secondary | ICD-10-CM

## 2018-01-16 NOTE — Therapy (Signed)
[facility] [address] [place], [place], [address] Phone: [phone]   Fax:  [phone]  Physical Therapy Treatment  Patient Details  Name: Rachel [name] MRN: [id number] Date of Birth: 10-28-1975 Referring Provider (PT): Lucretia Roers, [place] Ernesto Rutherford)  (Dr. Leretha Pol )   Encounter Date: 01/16/2018  PT End of Session - 01/16/18 1101    Visit Number  6    Number of Visits  16    PT Start Time  1100    PT Stop Time  1141    PT Time Calculation (min)  41 min    Activity Tolerance  Patient tolerated treatment well    Behavior During Therapy  [facility] for tasks assessed/performed       Past Medical History:  Diagnosis Date  . Atypical chest pain 08/17/2015  . 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 Orthostatic hypotension 08/17/2015  . PUD (peptic ulcer disease)     Past Surgical History:  Procedure Laterality Date  . CESAREAN SECTION      There were no vitals filed for this visit.  Subjective Assessment - 01/16/18 1104    Subjective  I had a rough couple of days, had some things going on. Shooting pain today at base of skull                       [facility] Adult PT Treatment/Exercise - 01/16/18 0001      Exercises   Exercises  Shoulder      Shoulder Exercises: Seated   Other Seated Exercises  flx & abd full ROM 1lb      Shoulder Exercises: Standing   Other Standing Exercises  AROM with 1lb: ER, ext, horiz abd    Other Standing Exercises  hip hinge with postural alginment using wand      Shoulder Exercises: ROM/Strengthening   UBE (Upper Arm Bike)  4 min L1 retro      Manual Therapy   Manual therapy comments  skilled palpation & monitoring during TPDN    Soft tissue mobilization  suboccipitals, levator & bil upper traps       Trigger Point Dry Needling - 01/16/18 1131    Muscles Treated Upper Body  Suboccipitals muscle group;Upper trapezius;Levator scapulae    Upper Trapezius Response  Twitch reponse  elicited;Palpable increased muscle length   Rt   SubOccipitals Response  Twitch response elicited;Palpable increased muscle length   bil   Levator Scapulae Response  Twitch response elicited;Palpable increased muscle length   Rt            PT Short Term Goals - 01/07/18 1131      PT SHORT TERM GOAL #1   Title  Pt will be I with HEP for cervical spine ROM and stabilization     Status  On-going      PT SHORT TERM GOAL #2   Title  Pt will be able to sit comfortably for meals, driving, 30 min , ONGE<9/52     Status  On-going        PT Long Term Goals - 12/19/17 1514      PT LONG TERM GOAL #1   Title  Pt will be able to raise arms overhead without neck pain, tension     Time  8    Period  Weeks    Status  New    Target Date  02/13/18      PT LONG TERM GOAL #2   Title  Pt  will demo 5/5 strength in bilateral UEs without neck pain for return to full functional mobility.     Time  8    Period  Weeks    Status  New    Target Date  02/13/18      PT LONG TERM GOAL #3   Title  Pt will be able to assist her son with ADLs, mobility and min occasional pain in neck    Time  8    Period  Weeks    Status  New    Target Date  02/13/18      PT LONG TERM GOAL #4   Title  Pt will be I with more advanced HEP at last visit    Time  8    Period  Weeks    Status  New    Target Date  02/13/18      PT LONG TERM GOAL #5   Title  Pt will be able to work on computer, do housework without increase in pain, fatigue.     Time  8    Period  Weeks    Status  New    Target Date  02/13/18            Plan - 01/16/18 1142    Clinical Impression Statement  Pt with high levels of stress today and increased tension noted especially in suboccipitals. Large ROM exercises today with discussion on inclusion of exercises through the day rather than setting aside time in a busy schedule. Pt was sore as expected and was encouraged to keep moving through the day and allow cervical ROM rather  than looking with her eyes.     PT Treatment/Interventions  ADLs/Self Care Home Management;Electrical Stimulation;Functional mobility training;Neuromuscular re-education;Taping;Therapeutic activities;Cryotherapy;Traction;Ultrasound;Moist Heat;Therapeutic exercise;Patient/family education;Manual techniques;Passive range of motion;Dry needling;Iontophoresis 4mg /ml Dexamethasone    PT Next Visit Plan  DN PRN, prone shoulder exercises, review hip hinge    PT Home Exercise Plan  chin tuck, extension, rotation, upper trap & levator stretch, scapular retraction, supine T, scap retraction, belly breathing, hip hinge, large ROM    Consulted and Agree with Plan of Care  Patient       Patient will benefit from skilled therapeutic intervention in order to improve the following deficits and impairments:  Decreased mobility, Hypomobility, Increased muscle spasms, Impaired sensation, Decreased range of motion, Decreased strength, Increased fascial restricitons, Impaired flexibility, Impaired UE functional use, Postural dysfunction, Pain  Visit Diagnosis: Cervicalgia  Abnormal posture  Muscle weakness (generalized)     Problem List Patient Active Problem List   Diagnosis Date Noted  . Anxiety 12/24/2017  . Adult situational stress disorder 06/26/2017  . Chronic migraine 12/28/2016  . Prediabetes 12/12/2016  . BMI 25.0-25.9,adult 11/01/2015  . Insomnia 11/01/2015  . Orthostatic hypotension 08/17/2015  . 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 Atypical chest pain 08/17/2015  . Hyperlipidemia 07/24/2015    Tallen Schnorr C. Addis Tuohy PT, DPT 01/16/18 3:04 PM   [facility] [address] [place], [place], [address] Phone: [phone]   Fax:  [phone]  Name: Sameen Leas MRN: [id number] Date of Birth: 1975/06/08

## 2018-01-17 ENCOUNTER — Ambulatory Visit: Payer: No Typology Code available for payment source | Admitting: Family Medicine

## 2018-01-21 ENCOUNTER — Encounter: Payer: Self-pay | Admitting: Physical Therapy

## 2018-01-21 ENCOUNTER — Ambulatory Visit: Payer: No Typology Code available for payment source | Attending: Physician Assistant | Admitting: Physical Therapy

## 2018-01-21 DIAGNOSIS — M542 Cervicalgia: Secondary | ICD-10-CM | POA: Insufficient documentation

## 2018-01-21 DIAGNOSIS — M6281 Muscle weakness (generalized): Secondary | ICD-10-CM | POA: Diagnosis present

## 2018-01-21 DIAGNOSIS — R293 Abnormal posture: Secondary | ICD-10-CM | POA: Insufficient documentation

## 2018-01-21 DIAGNOSIS — R208 Other disturbances of skin sensation: Secondary | ICD-10-CM | POA: Diagnosis present

## 2018-01-21 NOTE — Therapy (Signed)
[facility] [address] [place], [place], [address] Phone: [phone]   Fax:  [phone]  Physical Therapy Treatment  Patient Details  Name: Rachel [name] MRN: [id number] Date of Birth: 10-27-1975 Referring Provider (PT): Lucretia Roers, [place] Ernesto Rutherford)  (Dr. Leretha Pol )   Encounter Date: 01/21/2018  PT End of Session - 01/21/18 1250    Visit Number  7    Number of Visits  16    PT Start Time  1102    PT Stop Time  1147    PT Time Calculation (min)  45 min    Activity Tolerance  Patient tolerated treatment well    Behavior During Therapy  [facility] for tasks assessed/performed       Past Medical History:  Diagnosis Date  . Atypical chest pain 08/17/2015  . 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 Orthostatic hypotension 08/17/2015  . PUD (peptic ulcer disease)     Past Surgical History:  Procedure Laterality Date  . CESAREAN SECTION      There were no vitals filed for this visit.  Subjective Assessment - 01/21/18 1107    Subjective  Discomfort right now.  Tried the sports bra to remind me but that did not work.     Currently in Pain?  Yes    Pain Score  2             [facility] Adult PT Treatment/Exercise - 01/21/18 0001      Shoulder Exercises: Standing   Horizontal ABduction  Strengthening;Both;10 reps    Theraband Level (Shoulder Horizontal ABduction)  Level 2 (Red)    Flexion  Strengthening;Both;10 reps    Shoulder Flexion Weight (lbs)  narrow grip red     ABduction  Strengthening;Both;10 reps;Weights    Shoulder ABduction Weight (lbs)  2    Other Standing Exercises  elbows on foam roller for scapular mobility       Shoulder Exercises: ROM/Strengthening   UBE (Upper Arm Bike)  5 min reverse L1     Sustained Retraction with Theraband  quadruped lateral childs pose x 2     Other ROM/Strengthening Exercises  quadruped scapular protraction/retraction double and single arm     Other ROM/Strengthening Exercises  quadruped  rocking into childs pose for Rt periscapular      Shoulder Exercises: Stretch   Other Shoulder Stretches  sidelying upper trunk rotation x 5       Ultrasound   Ultrasound Location  Rt mid thoracic, medial border of scap    Ultrasound Parameters  1.5 W/cm2, 1.0 MHz, Cont .     Ultrasound Goals  Pain   used biofreeze      Manual Therapy   Soft tissue mobilization  Rt medial scapular border , lat               PT Short Term Goals - 01/07/18 1131      PT SHORT TERM GOAL #1   Title  Pt will be I with HEP for cervical spine ROM and stabilization     Status  On-going      PT SHORT TERM GOAL #2   Title  Pt will be able to sit comfortably for meals, driving, 30 min , ONGE<9/52     Status  On-going        PT Long Term Goals - 12/19/17 1514      PT LONG TERM GOAL #1   Title  Pt will be able to raise arms overhead without neck pain,  tension     Time  8    Period  Weeks    Status  New    Target Date  02/13/18      PT LONG TERM GOAL #2   Title  Pt will demo 5/5 strength in bilateral UEs without neck pain for return to full functional mobility.     Time  8    Period  Weeks    Status  New    Target Date  02/13/18      PT LONG TERM GOAL #3   Title  Pt will be able to assist her son with ADLs, mobility and min occasional pain in neck    Time  8    Period  Weeks    Status  New    Target Date  02/13/18      PT LONG TERM GOAL #4   Title  Pt will be I with more advanced HEP at last visit    Time  8    Period  Weeks    Status  New    Target Date  02/13/18      PT LONG TERM GOAL #5   Title  Pt will be able to work on computer, do housework without increase in pain, fatigue.     Time  8    Period  Weeks    Status  New    Target Date  02/13/18            Plan - 01/21/18 1107    Clinical Impression Statement  Pain today was different than previous sessions, focused more along Rt mid thoracic/periscapular area.  Worked mostly on mobilizing and strething.  Did not  tolerate prone.  Progression is slow due to varied pain location.     PT Treatment/Interventions  ADLs/Self Care Home Management;Electrical Stimulation;Functional mobility training;Neuromuscular re-education;Taping;Therapeutic activities;Cryotherapy;Traction;Ultrasound;Moist Heat;Therapeutic exercise;Patient/family education;Manual techniques;Passive range of motion;Dry needling;Iontophoresis 4mg /ml Dexamethasone;Other (comment)    PT Next Visit Plan  DN PRN, prone shoulder exercises, posture, try standing against wall, Reformer (ovehead press?)    PT Home Exercise Plan  chin tuck, extension, rotation, upper trap & levator stretch, scapular retraction, supine T, scap retraction, belly breathing, hip hinge, large ROM    Consulted and Agree with Plan of Care  Patient       Patient will benefit from skilled therapeutic intervention in order to improve the following deficits and impairments:  Decreased mobility, Hypomobility, Increased muscle spasms, Impaired sensation, Decreased range of motion, Decreased strength, Increased fascial restricitons, Impaired flexibility, Impaired UE functional use, Postural dysfunction, Pain  Visit Diagnosis: Cervicalgia  Abnormal posture  Muscle weakness (generalized)  Other disturbances of skin sensation     Problem List Patient Active Problem List   Diagnosis Date Noted  . Anxiety 12/24/2017  . Adult situational stress disorder 06/26/2017  . Chronic migraine 12/28/2016  . Prediabetes 12/12/2016  . BMI 25.0-25.9,adult 11/01/2015  . Insomnia 11/01/2015  . Orthostatic hypotension 08/17/2015  . 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 Atypical chest pain 08/17/2015  . Hyperlipidemia 07/24/2015    PAA,JENNIFER 01/21/2018, 12:51 PM  [facility] [address] [place], [place], [address] Phone: [phone]   Fax:  [phone]  Name: Rachel [name] MRN: [id number] Date of Birth: September 10, 1975  Karie Mainland, PT 01/21/18 12:51 PM Phone: [phone] Fax: [phone]

## 2018-01-23 ENCOUNTER — Ambulatory Visit: Payer: No Typology Code available for payment source | Admitting: Physical Therapy

## 2018-01-23 ENCOUNTER — Other Ambulatory Visit: Payer: Self-pay

## 2018-01-23 DIAGNOSIS — E785 Hyperlipidemia, unspecified: Secondary | ICD-10-CM

## 2018-01-23 DIAGNOSIS — R208 Other disturbances of skin sensation: Secondary | ICD-10-CM

## 2018-01-23 DIAGNOSIS — M542 Cervicalgia: Secondary | ICD-10-CM | POA: Diagnosis not present

## 2018-01-23 DIAGNOSIS — R7303 Prediabetes: Secondary | ICD-10-CM

## 2018-01-23 DIAGNOSIS — M6281 Muscle weakness (generalized): Secondary | ICD-10-CM

## 2018-01-23 DIAGNOSIS — R293 Abnormal posture: Secondary | ICD-10-CM

## 2018-01-23 LAB — LIPID PANEL
Cholesterol: 197 mg/dL (ref 0–200)
HDL: 50 mg/dL (ref >40)
LDL Cholesterol: 135 mg/dL — ABNORMAL HIGH (ref 0–99)
Total CHOL/HDL Ratio: 3.9 RATIO
Triglycerides: 58 mg/dL (ref <150)
VLDL: 12 mg/dL (ref 0–40)

## 2018-01-23 LAB — HEMOGLOBIN A1C
Hgb A1c MFr Bld: 5.6 % (ref 4.8–5.6)
Mean Plasma Glucose: 114.02 mg/dL

## 2018-01-23 NOTE — Therapy (Signed)
[facility] [address] [place], [place], [address] Phone: [phone]   Fax:  [phone]  Physical Therapy Treatment  Patient Details  Name: Rachel [name] MRN: [id number] Date of Birth: 1975-08-07 Referring Provider (PT): Lucretia Roers, [place] Ernesto Rutherford)  (Dr. Leretha Pol )   Encounter Date: 01/23/2018  PT End of Session - 01/23/18 1104    Visit Number  8    Number of Visits  16    Date for PT Re-Evaluation  02/13/18    PT Start Time  1055    PT Stop Time  1154   time will not match due to set up of traction    PT Time Calculation (min)  59 min    Activity Tolerance  Patient tolerated treatment well    Behavior During Therapy  [facility] for tasks assessed/performed       Past Medical History:  Diagnosis Date  . Atypical chest pain 08/17/2015  . 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 Orthostatic hypotension 08/17/2015  . PUD (peptic ulcer disease)     Past Surgical History:  Procedure Laterality Date  . CESAREAN SECTION      There were no vitals filed for this visit.  Subjective Assessment - 01/23/18 1101    Subjective  It was achy after the other day.  Today it hurts on the Rt and upper/middle back     Currently in Pain?  Yes    Pain Score  5     Pain Location  Back    Pain Orientation  Upper;Mid    Pain Descriptors / Indicators  Aching    Pain Type  Chronic pain    Pain Onset  More than a month ago    Pain Frequency  Intermittent    Aggravating Factors   sitting, lifting, activity, walking     Pain Relieving Factors  stretching, heat          [facility] PT Assessment - 01/23/18 0001      AROM   Cervical Flexion  35    Cervical Extension  45    Cervical - Right Side Bend  41    Cervical - Left Side Bend  35    Cervical - Right Rotation  60    Cervical - Left Rotation  72      Strength   Overall Strength Comments  biceps Rt/Lt (4/5, 4/5)    Right Shoulder Flexion  4/5    Right Shoulder ABduction  4/5    Left  Shoulder Flexion  4/5    Left Shoulder ABduction  4/5          [facility] Adult PT Treatment/Exercise - 01/23/18 0001      Self-Care   Other Self-Care Comments   tennis ball and theracane for medial scapular border muscle tension, goals/ progress       Shoulder Exercises: Standing   Horizontal ABduction  Strengthening;Both;10 reps    Theraband Level (Shoulder Horizontal ABduction)  Level 2 (Red)    Flexion  Strengthening;Both;10 reps    Shoulder Flexion Weight (lbs)  narrow grip red     Flexion Limitations  against the wall     Extension  Strengthening;Both;15 reps    Theraband Level (Shoulder Extension)  Level 3 (Green)    Row  Strengthening;Both;15 reps    Theraband Level (Shoulder Row)  Level 3 (Green)    Other Standing Exercises  single arm lat pull down x 10 red against wall       Traction  Type of Traction  Cervical    Min (lbs)  6    Max (lbs)  12    Hold Time  60    Rest Time  15    Time  15             PT Education - 01/23/18 1252    Education Details  self care , traction     Person(s) Educated  Patient    Methods  Explanation;Demonstration    Comprehension  Verbalized understanding       PT Short Term Goals - 01/23/18 1106      PT SHORT TERM GOAL #1   Title  Pt will be I with HEP for cervical spine ROM and stabilization     Status  Achieved      PT SHORT TERM GOAL #2   Title  Pt will be able to sit comfortably for meals, driving, 30 min , ZOXW<9/60     Baseline  driving is difficulty, pain can be mod to severe     Status  On-going        PT Long Term Goals - 01/23/18 1105      PT LONG TERM GOAL #1   Title  Pt will be able to raise arms overhead without neck pain, tension     Baseline  tension middle back to Rt side , sometimes pain     Status  On-going      PT LONG TERM GOAL #2   Title  Pt will demo 5/5 strength in bilateral UEs without neck pain for return to full functional mobility.     Baseline  4/5 generally both sides      PT LONG  TERM GOAL #3   Title  Pt will be able to assist her son with ADLs, mobility and min occasional pain in neck    Baseline  husband does the car     Status  On-going      PT LONG TERM GOAL #4   Title  Pt will be I with more advanced HEP at last visit    Status  On-going      PT LONG TERM GOAL #5   Title  Pt will be able to work on computer, do housework without increase in pain, fatigue.     Status  On-going            Plan - 01/23/18 1253    Clinical Impression Statement  Rachel [name] has been making slow progress.  Today she showed increased cervical ROM, UE strength. Trial of traction to cervical spine.  No longer has any radiating symptoms.      PT Treatment/Interventions  ADLs/Self Care Home Management;Electrical Stimulation;Functional mobility training;Neuromuscular re-education;Taping;Therapeutic activities;Cryotherapy;Traction;Ultrasound;Moist Heat;Therapeutic exercise;Patient/family education;Manual techniques;Passive range of motion;Dry needling;Iontophoresis 4mg /ml Dexamethasone;Other (comment)    PT Next Visit Plan  DN PRN, prone shoulder exercises, posture, try standing against wall, Reformer (ovehead press?)    PT Home Exercise Plan  chin tuck, extension, rotation, upper trap & levator stretch, scapular retraction, supine T, scap retraction, belly breathing, hip hinge, large ROM    Consulted and Agree with Plan of Care  Patient       Patient will benefit from skilled therapeutic intervention in order to improve the following deficits and impairments:  Decreased mobility, Hypomobility, Increased muscle spasms, Impaired sensation, Decreased range of motion, Decreased strength, Increased fascial restricitons, Impaired flexibility, Impaired UE functional use, Postural dysfunction, Pain  Visit Diagnosis: Cervicalgia  Abnormal posture  Muscle weakness (  generalized)  Other disturbances of skin sensation     Problem List Patient Active Problem List   Diagnosis Date Noted  .  Anxiety 12/24/2017  . Adult situational stress disorder 06/26/2017  . Chronic migraine 12/28/2016  . Prediabetes 12/12/2016  . BMI 25.0-25.9,adult 11/01/2015  . Insomnia 11/01/2015  . Orthostatic hypotension 08/17/2015  . 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 Atypical chest pain 08/17/2015  . Hyperlipidemia 07/24/2015    Shyteria Lewis 01/23/2018, 1:07 PM  [facility] [address] [place], [place], [address] Phone: [phone]   Fax:  [phone]  Name: Rachel [name] MRN: [id number] Date of Birth: 10/26/1975  Karie Mainland, PT 01/23/18 1:07 PM Phone: [phone] Fax: [phone]

## 2018-01-28 ENCOUNTER — Ambulatory Visit: Payer: No Typology Code available for payment source | Admitting: Physical Therapy

## 2018-01-28 ENCOUNTER — Other Ambulatory Visit: Payer: Self-pay | Admitting: Pediatrics

## 2018-01-28 DIAGNOSIS — J020 Streptococcal pharyngitis: Secondary | ICD-10-CM

## 2018-01-28 MED ORDER — PENICILLIN V POTASSIUM 500 MG PO TABS: 500.0000 mg | ORAL_TABLET | Freq: Three times a day (TID) | ORAL | 0 refills | Status: AC

## 2018-01-28 NOTE — Progress Notes (Signed)
Rapid strep positive in clinic today. Rx for penicillin x 10 days sent to the pharmacy for patient.

## 2018-01-30 ENCOUNTER — Ambulatory Visit: Payer: No Typology Code available for payment source | Admitting: Physical Therapy

## 2018-01-31 ENCOUNTER — Ambulatory Visit (INDEPENDENT_AMBULATORY_CARE_PROVIDER_SITE_OTHER): Payer: No Typology Code available for payment source | Admitting: Family Medicine

## 2018-01-31 ENCOUNTER — Other Ambulatory Visit: Payer: Self-pay

## 2018-01-31 ENCOUNTER — Encounter: Payer: Self-pay | Admitting: Family Medicine

## 2018-01-31 VITALS — BP 108/72 | HR 96 | Temp 98.7°F | Resp 14 | Ht 66.0 in | Wt 181.8 lb

## 2018-01-31 DIAGNOSIS — R7303 Prediabetes: Secondary | ICD-10-CM

## 2018-01-31 DIAGNOSIS — F411 Generalized anxiety disorder: Secondary | ICD-10-CM

## 2018-01-31 DIAGNOSIS — E785 Hyperlipidemia, unspecified: Secondary | ICD-10-CM

## 2018-01-31 MED ORDER — CLONAZEPAM 0.5 MG PO TABS: 0.5000 mg | ORAL_TABLET | Freq: Every day | ORAL | 0 refills | Status: DC | PRN

## 2018-01-31 MED ORDER — ESCITALOPRAM OXALATE 10 MG PO TABS: 10.0000 mg | ORAL_TABLET | Freq: Every day | ORAL | 1 refills | Status: DC
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
## 2018-01-31 NOTE — Patient Instructions (Signed)
° ° ° °  If you have lab work done today you will be contacted with your lab results within the next 2 weeks.  If you have not heard from us then please contact us. The fastest way to get your results is to register for My Chart. ° ° °IF you received an x-ray today, you will receive an invoice from [facility]. Please contact [facility] at [phone] with questions or concerns regarding your invoice.  ° °IF you received labwork today, you will receive an invoice from [facility]. Please contact [facility] at [phone] with questions or concerns regarding your invoice.  ° °Our billing staff will not be able to assist you with questions regarding bills from these companies. ° °You will be contacted with the lab results as soon as they are available. The fastest way to get your results is to activate your My Chart account. Instructions are located on the last page of this paperwork. If you have not heard from us regarding the results in 2 weeks, please contact this office. °  ° ° ° °

## 2018-02-05 ENCOUNTER — Encounter: Payer: Self-pay | Admitting: Physical Therapy

## 2018-02-05 ENCOUNTER — Ambulatory Visit: Payer: No Typology Code available for payment source | Admitting: Physical Therapy

## 2018-02-05 DIAGNOSIS — M542 Cervicalgia: Secondary | ICD-10-CM

## 2018-02-05 DIAGNOSIS — M6281 Muscle weakness (generalized): Secondary | ICD-10-CM

## 2018-02-05 DIAGNOSIS — R293 Abnormal posture: Secondary | ICD-10-CM

## 2018-02-05 NOTE — Therapy (Signed)
[facility] [address] [place], [place], [address] Phone: [phone]   Fax:  [phone]  Physical Therapy Treatment  Patient Details  Name: Rachel [name] MRN: [id number] Date of Birth: [birth year] Referring Provider (PT): Lucretia RoersEizabeth McVey, [place] ([place]  (Dr. Leretha PolSantiago )   Encounter Date: 02/05/2018  PT End of Session - 02/05/18 1336    Visit Number  9    Number of Visits  16    Date for PT Re-Evaluation  02/13/18    PT Start Time  1336    PT Stop Time  1412    PT Time Calculation (min)  36 min    Activity Tolerance  Patient tolerated treatment well    Behavior During Therapy  [facility] for tasks assessed/performed       Past Medical History:  Diagnosis Date  . Atypical chest pain 08/17/2015  . 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 Migraines   . Orthostatic hypotension 08/17/2015  . Prediabetes   . PUD (peptic ulcer disease)     Past Surgical History:  Procedure Laterality Date  . CESAREAN SECTION      There were no vitals filed for this visit.  Subjective Assessment - 02/05/18 1337    Subjective  Going a day or two with no pain and some days it hurts all day. Have not taken pain meds/mm relaxers in last two week. stretches are helpful. driving or computer postures are painful- pain driving to [place] this weekend.     Patient Stated Goals  Patient would like to get pain relief, learn what I need to do     Currently in Pain?  No/denies                       [facility] - 02/05/18 0001      Exercises   Exercises  Shoulder      Shoulder Exercises: Standing   Other Standing Exercises  FM- ext, ER, row with upper rot      Traction   Type of Traction  Cervical    Min (lbs)  6    Max (lbs)  12    Hold Time  60    Rest Time  15    Time  15               PT Short Term Goals - 01/23/18 1106      PT SHORT TERM GOAL #1   Title  Pt will be I with HEP for cervical spine ROM  and stabilization     Status  Achieved      PT SHORT TERM GOAL #2   Title  Pt will be able to sit comfortably for meals, driving, 30 min , ONGE<9/52pain<3/10     Baseline  driving is difficulty, pain can be mod to severe     Status  On-going        PT Long Term Goals - 01/23/18 1105      PT LONG TERM GOAL #1   Title  Pt will be able to raise arms overhead without neck pain, tension     Baseline  tension middle back to Rt side , sometimes pain     Status  On-going      PT LONG TERM GOAL #2   Title  Pt will demo 5/5 strength in bilateral UEs without neck pain for return to full functional mobility.     Baseline  4/5 generally both sides  PT LONG TERM GOAL #3   Title  Pt will be able to assist her son with ADLs, mobility and min occasional pain in neck    Baseline  husband does the car     Status  On-going      PT LONG TERM GOAL #4   Title  Pt will be I with more advanced HEP at last visit    Status  On-going      PT LONG TERM GOAL #5   Title  Pt will be able to work on computer, do housework without increase in pain, fatigue.     Status  On-going            Plan - 02/05/18 1413    Clinical Impression Statement  Felt that traction was helpful so it was done again. Felt some numbness at base of skull after. Progressed exercises using freeemotion and will use reformer at next visit. asked pt to do thoracic ext and GHJ ER with band every 30 min when on a computer    PT Treatment/Interventions  ADLs/Self Care Home Management;Electrical Stimulation;Functional mobility training;Neuromuscular re-education;Taping;Therapeutic activities;Cryotherapy;Traction;Ultrasound;Moist Heat;Therapeutic exercise;Patient/family education;Manual techniques;Passive range of motion;Dry needling;Iontophoresis 4mg /ml Dexamethasone;Other (comment)    PT Next Visit Plan  DN PRN, prone shoulder exercises, posture, try standing against wall, Reformer (ovehead press?)    PT Home Exercise Plan  chin tuck,  extension, rotation, upper trap & levator stretch, scapular retraction, supine T, scap retraction, belly breathing, hip hinge, large ROM       Patient will benefit from skilled therapeutic intervention in order to improve the following deficits and impairments:  Decreased mobility, Hypomobility, Increased muscle spasms, Impaired sensation, Decreased range of motion, Decreased strength, Increased fascial restricitons, Impaired flexibility, Impaired UE functional use, Postural dysfunction, Pain  Visit Diagnosis: Cervicalgia  Abnormal posture  Muscle weakness (generalized)     Problem List Patient Active Problem List   Diagnosis Date Noted  . Anxiety 12/24/2017  . Adult situational stress disorder 06/26/2017  . Chronic migraine 12/28/2016  . Prediabetes 12/12/2016  . BMI 25.0-25.9,adult 11/01/2015  . Insomnia 11/01/2015  . Orthostatic hypotension 08/17/2015  . 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 Atypical chest pain 08/17/2015  . Hyperlipidemia 07/24/2015    Adore Kithcart C. Breion Novacek PT, DPT 02/05/18 2:15 PM   [facility] [address] [place], [place], [address] Phone: [phone]   Fax:  [phone]  Name: Rachel [name] MRN: [id number] Date of Birth: [birth year]

## 2018-02-19 ENCOUNTER — Ambulatory Visit: Payer: No Typology Code available for payment source | Attending: Physician Assistant | Admitting: Physical Therapy

## 2018-02-19 DIAGNOSIS — R293 Abnormal posture: Secondary | ICD-10-CM | POA: Insufficient documentation

## 2018-02-19 DIAGNOSIS — M542 Cervicalgia: Secondary | ICD-10-CM | POA: Insufficient documentation

## 2018-02-19 DIAGNOSIS — M6281 Muscle weakness (generalized): Secondary | ICD-10-CM | POA: Diagnosis present

## 2018-02-19 DIAGNOSIS — R208 Other disturbances of skin sensation: Secondary | ICD-10-CM | POA: Insufficient documentation

## 2018-02-19 NOTE — Therapy (Signed)
[place], [place], [address] Phone: [phone]   Fax:  [phone]  Physical Therapy Treatment/Renewal  Patient Details  Name: Rachel [name] MRN: [id number] Date of Birth: [birth year] Referring Provider (PT): Valentino Hue, [place])  (Dr. Pamella Pert )   Encounter Date: 02/19/2018  PT End of Session - 02/19/18 1305    Visit Number  10    Number of Visits  16    Date for PT Re-Evaluation  04/02/18    PT Start Time  1300    PT Stop Time  1353    PT Time Calculation (min)  53 min    Activity Tolerance  Patient tolerated treatment well    Behavior During Therapy  [facility] for tasks assessed/performed       Past Medical History:  Diagnosis Date  . Atypical chest pain 08/17/2015  . 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 Migraines   . Orthostatic hypotension 08/17/2015  . Prediabetes   . PUD (peptic ulcer disease)     Past Surgical History:  Procedure Laterality Date  . CESAREAN SECTION      There were no vitals filed for this visit.  Subjective Assessment - 02/19/18 1309    Subjective  No pain right now, just discomfort . Last weekend had to lift and care for son, [place], had low back pain     Diagnostic tests  CT scan with Small C4-5 central disc herniation.  XR neg lumbar and thoracic spine 11/15/17    Currently in Pain?  No/denies    Pain Location  Back    Pain Orientation  Upper;Mid    Pain Descriptors / Indicators  Discomfort    Pain Type  Chronic pain    Pain Onset  More than a month ago    Pain Frequency  Intermittent         [facility] PT Assessment - 02/19/18 0001      AROM   Cervical Flexion  40    Cervical Extension  30   pop   Cervical - Right Side Bend  40    Cervical - Left Side Bend  35    Cervical - Right Rotation  60    Cervical - Left Rotation  70      Strength   Overall Strength Comments  biceps Rt/Lt (4/5, 4/5)    Right Shoulder Flexion  4/5    Right  Shoulder ABduction  4/5    Left Shoulder Flexion  4/5    Left Shoulder ABduction  4/5            [facility] Adult PT Treatment/Exercise - 02/19/18 0001      Lumbar Exercises: Quadruped   Madcat/Old Horse  10 reps      Shoulder Exercises: Standing   Horizontal ABduction  Strengthening;Both;10 reps    Shoulder Flexion Weight (lbs)  magic circle 2 x 10     ABduction  Strengthening;Both;10 reps    Other Standing Exercises  wall for posture and above ex" scapular retraction     Other Standing Exercises  horiz abd with elbows bent ("cactus arms') with 3 lbs       Shoulder Exercises: ROM/Strengthening   Other ROM/Strengthening Exercises  quadruped rocking into childs pose for Rt periscapular      Moist Heat Therapy   Number Minutes Moist Heat  10 Minutes    Moist Heat Location  Cervical      Manual Therapy   Soft  tissue mobilization  posterior cervicals, upper trap, Rt levator scapula, rhomboids , trigger point release                PT Short Term Goals - 02/19/18 1306      PT SHORT TERM GOAL #1   Title  Pt will be I with HEP for cervical spine ROM and stabilization     Status  Achieved      PT SHORT TERM GOAL #2   Title  Pt will be able to sit comfortably for meals, driving, 30 min , EGBT<5/17     Baseline  most of the time she can do this     Status  Partially Met        PT Long Term Goals - 02/19/18 1306      PT LONG TERM GOAL #1   Title  Pt will be able to raise arms overhead without neck pain, tension     Baseline  middle back soreness, discomfort     Status  On-going      PT LONG TERM GOAL #2   Title  Pt will demo 5/5 strength in bilateral UEs without neck pain for return to full functional mobility.     Baseline  4/5 generally both sides    Status  On-going      PT LONG TERM GOAL #3   Title  Pt will be able to assist her son with ADLs, mobility and min occasional pain in neck    Baseline  did this past weekend and pain increased in low back, neck.      Status  On-going      PT LONG TERM GOAL #4   Title  Pt will be I with more advanced HEP at last visit    Status  On-going      PT LONG TERM GOAL #5   Title  Pt will be able to work on computer, do housework without increase in pain, fatigue.     Baseline  can sometimes do this but is on the computer most of the day    Status  On-going            Plan - 02/19/18 1316    Clinical Impression Statement  Patient has reached end of current POC.  She is improved in the intensity and frequency of pain but continues with moderate pain, UE weakness.  She no longer has sensory disturbance.  She has found PT to be beneficial but due to her situation wiht her son, she needs to be able to assist with his care regularly and not further injure her back.  She will continue with PT for 6 more weeks.     PT Frequency  2x / week    PT Duration  6 weeks    PT Treatment/Interventions  ADLs/Self Care Home Management;Electrical Stimulation;Functional mobility training;Neuromuscular re-education;Taping;Therapeutic activities;Cryotherapy;Traction;Ultrasound;Moist Heat;Therapeutic exercise;Patient/family education;Manual techniques;Passive range of motion;Dry needling;Iontophoresis '4mg'$ /ml Dexamethasone;Other (comment)    PT Next Visit Plan  DN PRN, prone shoulder exercises, posture, try standing against wall, Reformer (ovehead press?)    PT Home Exercise Plan  chin tuck, spinal mobility (cat/camel) , extension, rotation, upper trap & levator stretch, scapular retraction, supine T, scap retraction, belly breathing, hip hinge, large ROM    Consulted and Agree with Plan of Care  Patient       Patient will benefit from skilled therapeutic intervention in order to improve the following deficits and impairments:  Decreased mobility, Hypomobility, Increased muscle spasms,  Impaired sensation, Decreased range of motion, Decreased strength, Increased fascial restricitons, Impaired flexibility, Impaired UE functional use,  Postural dysfunction, Pain  Visit Diagnosis: Cervicalgia  Abnormal posture  Muscle weakness (generalized)  Other disturbances of skin sensation     Problem List Patient Active Problem List   Diagnosis Date Noted  . Anxiety 12/24/2017  . Adult situational stress disorder 06/26/2017  . Chronic migraine 12/28/2016  . Prediabetes 12/12/2016  . BMI 25.0-25.9,adult 11/01/2015  . Insomnia 11/01/2015  . Orthostatic hypotension 08/17/2015  . 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 Atypical chest pain 08/17/2015  . Hyperlipidemia 07/24/2015    PAA,JENNIFER 02/19/2018, 2:47 PM  [facility] [address] [place], [place], [address] Phone: [phone]   Fax:  [phone]  Name: Rachel [name] MRN: [id number] Date of Birth: 07/07/1975  [name], PT 02/19/18 2:47 PM Phone: [phone] Fax: [phone]

## 2018-02-25 ENCOUNTER — Encounter: Payer: Self-pay | Admitting: Physical Therapy

## 2018-02-25 ENCOUNTER — Ambulatory Visit: Payer: No Typology Code available for payment source | Admitting: Physical Therapy

## 2018-02-25 DIAGNOSIS — M6281 Muscle weakness (generalized): Secondary | ICD-10-CM

## 2018-02-25 DIAGNOSIS — R293 Abnormal posture: Secondary | ICD-10-CM

## 2018-02-25 DIAGNOSIS — R208 Other disturbances of skin sensation: Secondary | ICD-10-CM

## 2018-02-25 DIAGNOSIS — M542 Cervicalgia: Secondary | ICD-10-CM

## 2018-02-25 NOTE — Therapy (Signed)
Rachel [name], [place], [address] Phone: [phone]   Fax:  [phone]  Physical Therapy Treatment  Patient Details  Name: Rachel [name] MRN: [id number] Date of Birth: 1975/05/08 Referring Provider (PT): Valentino Hue, [place] ([place]  (Dr. Pamella Pert )   Encounter Date: 02/25/2018  PT End of Session - 02/25/18 1153    Visit Number  11    Number of Visits  16    Date for PT Re-Evaluation  04/02/18    PT Start Time  7915    PT Stop Time  1232    PT Time Calculation (min)  41 min    Activity Tolerance  Patient tolerated treatment well    Behavior During Therapy  [facility] for tasks assessed/performed       Past Medical History:  Diagnosis Date  . Atypical chest pain 08/17/2015  . 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 Migraines   . Orthostatic hypotension 08/17/2015  . Prediabetes   . PUD (peptic ulcer disease)     Past Surgical History:  Procedure Laterality Date  . CESAREAN SECTION      There were no vitals filed for this visit.  Subjective Assessment - 02/25/18 1152    Subjective  Soreness, 2/10. Did not sleep much, was up alot due to pain.          [place] Adult PT Treatment/Exercise - 02/25/18 0001      Pilates   Pilates Reformer  see note       Lumbar Exercises: Quadruped   Madcat/Old Horse  10 reps      Shoulder Exercises: Prone   Extension  Strengthening;Both;12 reps    External Rotation  Strengthening;Both;10 reps    External Rotation Limitations  cactus arms x 10     Horizontal ABduction 1  Strengthening;Both;10 reps    Horizontal ABduction 2  Strengthening;Both;10 reps      Manual Therapy   Soft tissue mobilization  bilateral posterior cervicals, upper traps, suboccipitals     Manual Traction  gentle, with PROM         Pilates Reformer used for LE/core strength, postural strength, lumbopelvic disassociation and core control.  Exercises included:  Long box Prone overhead press x 10  1 Red spring   Double arms x 15 , single arm x 15 cues for reducing shrug Pulling straps 1 blue   Extension, triceps x 10 with mod cues tactile and manual      PT Education - 02/25/18 1159    Education Details  prone ex, Pilates, Reformer    Person(s) Educated  Patient    Methods  Explanation;Demonstration    Comprehension  Verbalized understanding;Returned demonstration       PT Short Term Goals - 02/19/18 1306      PT SHORT TERM GOAL #1   Title  Pt will be I with HEP for cervical spine ROM and stabilization     Status  Achieved      PT SHORT TERM GOAL #2   Title  Pt will be able to sit comfortably for meals, driving, 30 min , AVWP<7/94     Baseline  most of the time she can do this     Status  Partially Met        PT Long Term Goals - 02/19/18 1306      PT LONG TERM GOAL #1   Title  Pt will be able to raise arms overhead without neck pain, tension     Baseline  middle back soreness, discomfort     Status  On-going      PT LONG TERM GOAL #2   Title  Pt will demo 5/5 strength in bilateral UEs without neck pain for return to full functional mobility.     Baseline  4/5 generally both sides    Status  On-going      PT LONG TERM GOAL #3   Title  Pt will be able to assist her son with ADLs, mobility and min occasional pain in neck    Baseline  did this past weekend and pain increased in low back, neck.     Status  On-going      PT LONG TERM GOAL #4   Title  Pt will be I with more advanced HEP at last visit    Status  On-going      PT LONG TERM GOAL #5   Title  Pt will be able to work on computer, do housework without increase in pain, fatigue.     Baseline  can sometimes do this but is on the computer most of the day    Status  On-going            Plan - 02/25/18 1152    PT Treatment/Interventions  ADLs/Self Care Home Management;Electrical Stimulation;Functional mobility training;Neuromuscular re-education;Taping;Therapeutic  activities;Cryotherapy;Traction;Ultrasound;Moist Heat;Therapeutic exercise;Patient/family education;Manual techniques;Passive range of motion;Dry needling;Iontophoresis '4mg'$ /ml Dexamethasone;Other (comment)    PT Next Visit Plan  DN PRN, prone shoulder exercises, posture, try standing against wall, Reformer (ovehead press?)    PT Home Exercise Plan  chin tuck, spinal mobility (cat/camel) , extension, rotation, upper trap & levator stretch, scapular retraction, supine T, scap retraction, belly breathing, hip hinge, large ROM    Consulted and Agree with Plan of Care  Patient       Patient will benefit from skilled therapeutic intervention in order to improve the following deficits and impairments:  Decreased mobility, Hypomobility, Increased muscle spasms, Impaired sensation, Decreased range of motion, Decreased strength, Increased fascial restricitons, Impaired flexibility, Impaired UE functional use, Postural dysfunction, Pain  Visit Diagnosis: Cervicalgia  Abnormal posture  Muscle weakness (generalized)  Other disturbances of skin sensation     Problem List Patient Active Problem List   Diagnosis Date Noted  . Anxiety 12/24/2017  . Adult situational stress disorder 06/26/2017  . Chronic migraine 12/28/2016  . Prediabetes 12/12/2016  . BMI 25.0-25.9,adult 11/01/2015  . Insomnia 11/01/2015  . Orthostatic hypotension 08/17/2015  . 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 Atypical chest pain 08/17/2015  . Hyperlipidemia 07/24/2015    PAA,JENNIFER 02/25/2018, 12:37 PM  [facility] [address] [place], [place], [address] Phone: [phone]   Fax:  [phone]  Name: Rachel [name] MRN: [id number] Date of Birth: 02/25/1976  [name], PT 02/25/18 12:39 PM Phone: [phone] Fax: [phone]

## 2018-02-28 ENCOUNTER — Ambulatory Visit: Payer: No Typology Code available for payment source | Admitting: Physical Therapy

## 2018-03-03 ENCOUNTER — Ambulatory Visit: Payer: No Typology Code available for payment source | Admitting: Family Medicine

## 2018-03-04 ENCOUNTER — Encounter: Payer: Self-pay | Admitting: Physical Therapy

## 2018-03-04 ENCOUNTER — Ambulatory Visit: Payer: No Typology Code available for payment source | Admitting: Physical Therapy

## 2018-03-04 DIAGNOSIS — M6281 Muscle weakness (generalized): Secondary | ICD-10-CM

## 2018-03-04 DIAGNOSIS — R208 Other disturbances of skin sensation: Secondary | ICD-10-CM

## 2018-03-04 DIAGNOSIS — R293 Abnormal posture: Secondary | ICD-10-CM

## 2018-03-04 DIAGNOSIS — M542 Cervicalgia: Secondary | ICD-10-CM | POA: Diagnosis not present

## 2018-03-04 NOTE — Therapy (Signed)
[place], [place], [address] Phone: [phone]   Fax:  [phone]  Physical Therapy Treatment/Discharge  Patient Details  Name: Rachel [name] MRN: [id number] Date of Birth: Dec 11, 1975 Referring Provider (PT): Valentino Hue, [place] ([place])  (Dr. Pamella Pert )   Encounter Date: 03/04/2018  PT End of Session - 03/04/18 1205    Visit Number  12    Number of Visits  16    Date for PT Re-Evaluation  04/02/18    PT Start Time  6144    PT Stop Time  1234    PT Time Calculation (min)  45 min    Activity Tolerance  Patient tolerated treatment well    Behavior During Therapy  [facility] for tasks assessed/performed       Past Medical History:  Diagnosis Date  . Atypical chest pain 08/17/2015  . 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 Migraines   . Orthostatic hypotension 08/17/2015  . Prediabetes   . PUD (peptic ulcer disease)     Past Surgical History:  Procedure Laterality Date  . CESAREAN SECTION      There were no vitals filed for this visit.  Subjective Assessment - 03/04/18 1154    Subjective  Patient's son may need a procedure, has appts  Is concerned that she may not be able to manage her son's appts.  Today will be last day.  Pain was moderate over the weekend.  Knows her HEP, uses tennis ball, heat.     Currently in Pain?  No/denies         [facility] Adult PT Treatment/Exercise - 03/04/18 0001      Shoulder Exercises: Standing   Horizontal ABduction  Strengthening;Both;10 reps    Theraband Level (Shoulder Horizontal ABduction)  Level 3 (Green)    External Rotation  Strengthening;Both;10 reps    Theraband Level (Shoulder External Rotation)  Level 3 (Green)    Flexion  Strengthening;Both;10 reps    Theraband Level (Shoulder Flexion)  Level 3 (Green)    Extension  Strengthening;Both;15 reps    Theraband Level (Shoulder Extension)  Level 3 (Green)    Row  Strengthening;Both;15 reps    Theraband Level  (Shoulder Row)  Level 3 (Green)    Other Standing Exercises  wall for posture and above ex" scapular retraction     Other Standing Exercises  reverse fly against the wall x 10 no wgt.       Shoulder Exercises: ROM/Strengthening   UBE (Upper Arm Bike)  6 min L1 , 3 min FW and back       Moist Heat Therapy   Number Minutes Moist Heat  10 Minutes    Moist Heat Location  Cervical      Neck Exercises: Stretches   Upper Trapezius Stretch  Right;Left;2 reps    Levator Stretch  Right;Left;2 reps    Chest Stretch Limitations  rotation x 3 each side              PT Education - 03/04/18 1226    Education Details  final HEP, pOC     Person(s) Educated  Patient    Methods  Explanation;Handout    Comprehension  Verbalized understanding       PT Short Term Goals - 03/04/18 1226      PT SHORT TERM GOAL #1   Title  Pt will be I with HEP for cervical spine ROM and stabilization     Status  Achieved  PT SHORT TERM GOAL #2   Title  Pt will be able to sit comfortably for meals, driving, 30 min , GYBW<3/89     Status  Achieved        PT Long Term Goals - 03/04/18 1218      PT LONG TERM GOAL #1   Title  Pt will be able to raise arms overhead without neck pain, tension     Status  Partially Met      PT LONG TERM GOAL #2   Title  Pt will demo 5/5 strength in bilateral UEs without neck pain for return to full functional mobility.     Status  Partially Met      PT LONG TERM GOAL #3   Title  Pt will be able to assist her son with ADLs, mobility and min occasional pain in neck    Status  Not Met      PT LONG TERM GOAL #4   Title  Pt will be I with more advanced HEP at last visit    Status  Achieved      PT LONG TERM GOAL #5   Title  Pt will be able to work on computer, do housework without increase in pain, fatigue.     Status  Partially Met            Plan - 03/04/18 1203    Clinical Impression Statement  DC today.  Patient has partiall met or met her goals.  She  wil be unable to attend PT due to her son's current health issue.  She was encouraged that her injury will heal, pain has improved to where she can manage it.  Continues with UE fatigue with overhead and shoulder height exercises.      PT Treatment/Interventions  ADLs/Self Care Home Management;Electrical Stimulation;Functional mobility training;Neuromuscular re-education;Taping;Therapeutic activities;Cryotherapy;Traction;Ultrasound;Moist Heat;Therapeutic exercise;Patient/family education;Manual techniques;Passive range of motion;Dry needling;Iontophoresis '4mg'$ /ml Dexamethasone;Other (comment)    PT Next Visit Plan  DC    PT Home Exercise Plan  chin tuck, spinal mobility (cat/camel) , extension, rotation, upper trap & levator stretch, scapular retraction, supine T, scap retraction, belly breathing, hip hinge, large ROM    Consulted and Agree with Plan of Care  Patient       Patient will benefit from skilled therapeutic intervention in order to improve the following deficits and impairments:  Decreased mobility, Hypomobility, Increased muscle spasms, Impaired sensation, Decreased range of motion, Decreased strength, Increased fascial restricitons, Impaired flexibility, Impaired UE functional use, Postural dysfunction, Pain  Visit Diagnosis: Cervicalgia  Abnormal posture  Muscle weakness (generalized)  Other disturbances of skin sensation     Problem List Patient Active Problem List   Diagnosis Date Noted  . Anxiety 12/24/2017  . Adult situational stress disorder 06/26/2017  . Chronic migraine 12/28/2016  . Prediabetes 12/12/2016  . BMI 25.0-25.9,adult 11/01/2015  . Insomnia 11/01/2015  . Orthostatic hypotension 08/17/2015  . 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 Atypical chest pain 08/17/2015  . Hyperlipidemia 07/24/2015    [name] 03/04/2018, 12:31 PM  [facility] [address] [place], [place], [address] Phone:  [phone]   Fax:  [phone]  Name: Rachel [name] MRN: [id number] Date of Birth: 1975-09-15   PHYSICAL THERAPY DISCHARGE SUMMARY  Visits from Start of Care: 12  Current functional level related to goals / functional outcomes: See above    Remaining deficits: UE weakness, pain, postural stability    Education / Equipment: HEP, self care, anatomy   Plan:  Patient agrees to discharge.  Patient goals were partially met. Patient is being discharged due to the patient's request.  ?????   Needs to DC due to son's health issues.      [name], PT 03/04/18 12:31 PM Phone: [phone] Fax: [phone]

## 2018-03-04 NOTE — Patient Instructions (Signed)
Low Row: Standing    Face anchor, feet shoulder width apart. Palms up, pull arms back, squeezing shoulder blades together. Repeat _15_ times per set. Do _2_ sets per session. Do _3_ sessions per week. Anchor Height: Waist  [email or web address].[email or web address]   Copyright  VHI. All rights reserved.    EXTENSION: Standing - Resistance Band: Stable (Active)    Stand, right arm at side. Against yellow resistance band, draw arm backward, as far as possible, keeping elbow straight. Complete __2_ sets of __15_ repetitions. Perform ___3 sessions per week  Copyright  VHI. All rights reserved.

## 2018-03-05 ENCOUNTER — Ambulatory Visit: Payer: No Typology Code available for payment source | Admitting: Family Medicine

## 2018-03-06 ENCOUNTER — Ambulatory Visit: Payer: No Typology Code available for payment source | Admitting: Physical Therapy

## 2018-03-17 ENCOUNTER — Encounter: Payer: Self-pay | Admitting: Physical Therapy

## 2018-03-20 ENCOUNTER — Encounter: Payer: Self-pay | Admitting: Physical Therapy

## 2018-03-24 ENCOUNTER — Encounter: Payer: Self-pay | Admitting: Physical Therapy

## 2018-03-26 ENCOUNTER — Encounter: Payer: Self-pay | Admitting: Physical Therapy

## 2018-03-31 ENCOUNTER — Encounter: Payer: Self-pay | Admitting: Physical Therapy

## 2018-04-18 ENCOUNTER — Ambulatory Visit: Payer: No Typology Code available for payment source | Admitting: Family Medicine
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
## 2018-04-29 ENCOUNTER — Ambulatory Visit: Payer: No Typology Code available for payment source | Admitting: Nurse Practitioner

## 2018-06-09 ENCOUNTER — Other Ambulatory Visit: Payer: Self-pay | Admitting: Family Medicine

## 2018-06-09 DIAGNOSIS — R7303 Prediabetes: Secondary | ICD-10-CM

## 2018-06-09 DIAGNOSIS — E785 Hyperlipidemia, unspecified: Secondary | ICD-10-CM

## 2018-07-14 ENCOUNTER — Encounter: Payer: Self-pay | Admitting: Family Medicine

## 2018-07-14 ENCOUNTER — Other Ambulatory Visit: Payer: Self-pay

## 2018-07-14 ENCOUNTER — Telehealth (INDEPENDENT_AMBULATORY_CARE_PROVIDER_SITE_OTHER): Payer: No Typology Code available for payment source | Admitting: Family Medicine

## 2018-07-14 VITALS — BP 111/72 | Ht 66.0 in | Wt 178.0 lb

## 2018-07-14 DIAGNOSIS — Z8719 Personal history of other diseases of the digestive system: Secondary | ICD-10-CM

## 2018-07-14 DIAGNOSIS — Z8711 Personal history of peptic ulcer disease: Secondary | ICD-10-CM

## 2018-07-14 DIAGNOSIS — K5909 Other constipation: Secondary | ICD-10-CM

## 2018-07-14 DIAGNOSIS — R1013 Epigastric pain: Secondary | ICD-10-CM | POA: Diagnosis not present

## 2018-07-14 MED ORDER — PANTOPRAZOLE SODIUM 40 MG PO TBEC: 40.0000 mg | DELAYED_RELEASE_TABLET | Freq: Two times a day (BID) | ORAL | 0 refills | Status: DC

## 2018-07-14 NOTE — Patient Instructions (Signed)
° ° ° °  If you have lab work done today you will be contacted with your lab results within the next 2 weeks.  If you have not heard from us then please contact us. The fastest way to get your results is to register for My Chart. ° ° °IF you received an x-ray today, you will receive an invoice from [facility]. Please contact [facility] at [phone] with questions or concerns regarding your invoice.  ° °IF you received labwork today, you will receive an invoice from [facility]. Please contact [facility] at [phone] with questions or concerns regarding your invoice.  ° °Our billing staff will not be able to assist you with questions regarding bills from these companies. ° °You will be contacted with the lab results as soon as they are available. The fastest way to get your results is to activate your My Chart account. Instructions are located on the last page of this paperwork. If you have not heard from us regarding the results in 2 weeks, please contact this office. °  ° ° ° °

## 2018-07-14 NOTE — Progress Notes (Signed)
CC: Stomach ache x 2 weeks w/ hx of ulcers and pt thinks ulcers have come back, H pylori w/ the ulcer in the past and per pt it is the same pain.  Pain level 8/10, intermittently.  No otc meds for pain.  Drinking mint and it helps a little. No travel outside the [place] or [place] in the past 2 weeks.

## 2018-07-14 NOTE — Progress Notes (Signed)
Virtual Visit via Telephone Note  I connected with Rachel [name] on 07/14/18 at 5:04 PM by telephone and verified that I am speaking with the correct person using two identifiers.   I discussed the limitations, risks, security and privacy concerns of performing an evaluation and management service by telephone and the availability of in person appointments. I also discussed with the patient that there may be a patient responsible charge related to this service. The patient expressed understanding and agreed to proceed, consent obtained  Chief complaint: Abdominal pain  History of Present Illness: Rachel [name] is a 43 y.o. female  Started having abdominal pain 2 weeks ago. Notes just below rib area. Vomiting a few times last week- no blood or coffee grounds.  None this week. No fever. Feels  Exactly the same as few years ago when had stomach ulcer - about 2014 or 2015. . Treated for H pylori at that time.  Chronic constipation, about the same recently - BM once per week. Using senokot 2 pills per day. Still only 1 BM per week, sometimes 2 times per week. No changes in constipation.  Noted dark stools - noticed this on and off for past month.  No lightheadededness or dizziness. No PPI at this time.   No recent eval with Dr. Paulita Fujita (GI). Prior ulcer had improved on repeat endoscopy in 2015.  Had not been having any pain until few weeks ago.   No tobacco, or alcohol.  No recent nsaids - naproxen once per month for mestrual cramping.   Patient Active Problem List   Diagnosis Date Noted  . Anxiety 12/24/2017  . Adult situational stress disorder 06/26/2017  . Chronic migraine 12/28/2016  . Prediabetes 12/12/2016  . BMI 25.0-25.9,adult 11/01/2015  . Insomnia 11/01/2015  . Orthostatic hypotension 08/17/2015  . 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 Atypical chest pain 08/17/2015  . Hyperlipidemia 07/24/2015   Past Medical History:  Diagnosis Date  . Atypical chest pain 08/17/2015   . 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 Migraines   . Orthostatic hypotension 08/17/2015  . Prediabetes   . PUD (peptic ulcer disease)    Past Surgical History:  Procedure Laterality Date  . CESAREAN SECTION     No Known Allergies Prior to Admission medications   Medication Sig Start Date End Date Taking? Authorizing Provider  atorvastatin (LIPITOR) 80 MG tablet TAKE 1 TABLET BY MOUTH DAILY. 06/09/18  Yes Grant Fontana M, MD  glucose blood (FREESTYLE LITE) test strip Use once daily to check blood sugar 12/18/16  Yes Jeffery, Chelle, PA  glucose monitoring kit (FREESTYLE) monitoring kit 1 each by Does not apply route daily. 12/18/16  Yes Jeffery, Chelle, PA  metFORMIN (GLUCOPHAGE-XR) 750 MG 24 hr tablet TAKE 1 TABLET (750 MG TOTAL) BY MOUTH DAILY WITH BREAKFAST. 06/09/18  Yes Rutherford Guys, MD  clonazePAM (KLONOPIN) 0.5 MG tablet Take 1 tablet (0.5 mg total) by mouth daily as needed for anxiety. Patient not taking: Reported on 07/14/2018 01/31/18   Rutherford Guys, MD  escitalopram (LEXAPRO) 10 MG tablet Take 1 tablet (10 mg total) by mouth daily. Patient not taking: Reported on 07/14/2018 01/31/18   Rutherford Guys, MD  ibuprofen (ADVIL,MOTRIN) 600 MG tablet Take 1 tablet (600 mg total) by mouth every 8 (eight) hours as needed. Patient not taking: Reported on 07/14/2018 05/06/17   Ivar Drape D, PA  magnesium gluconate (MAGONATE) 500 MG tablet Take 500 mg by mouth 2 (two) times daily.    [provider]  Multiple Vitamin (MULTIVITAMIN WITH MINERALS) TABS tablet Take 1 tablet by mouth daily.    [provider]  naproxen (EC NAPROSYN) 500 MG EC tablet Take 1 tablet (500 mg total) by mouth 2 (two) times daily with a meal. Patient not taking: Reported on 07/14/2018 10/15/17   Rutherford Guys, MD  nortriptyline (PAMELOR) 10 MG capsule Take 2 capsules (20 mg total) by mouth at bedtime. Patient not taking: Reported on 07/14/2018 06/05/17   Marcial Pacas, MD  ondansetron (ZOFRAN  ODT) 4 MG disintegrating tablet Take 1 tablet (4 mg total) by mouth every 8 (eight) hours as needed. Patient not taking: Reported on 07/14/2018 12/24/17   Marcial Pacas, MD  propranolol (INDERAL) 40 MG tablet Take 1 tablet (40 mg total) by mouth 2 (two) times daily. Patient not taking: Reported on 07/14/2018 09/10/17   Marcial Pacas, MD  rizatriptan (MAXALT-MLT) 10 MG disintegrating tablet Take 1 tablet (10 mg total) by mouth as needed for migraine. May repeat in 2 hours if needed Patient not taking: Reported on 07/14/2018 09/10/17   Marcial Pacas, MD  vitamin B-12 (CYANOCOBALAMIN) 100 MCG tablet Take 100 mcg by mouth daily.    [provider]   Social History   Socioeconomic History  . Marital status: Married    Spouse name: Not on file  . Number of children: 2  . Years of education: Not on file  . Highest education level: Not on file  Occupational History  . 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 Financial resource strain: Not on file  . Food insecurity:    Worry: Not on file    Inability: Not on file  . Transportation needs:    Medical: Not on file    Non-medical: Not on file  Tobacco Use  . Smoking status: Never Smoker  . Smokeless tobacco: Never Used  Substance and Sexual Activity  . Alcohol use: No  . Drug use: No  . Sexual activity: Yes    Birth control/protection: Condom  Lifestyle  . Physical activity:    Days per week: Not on file    Minutes per session: Not on file  . Stress: Not on file  Relationships  . Social connections:    Talks on phone: Not on file    Gets together: Not on file    Attends religious service: Not on file    Active member of club or organization: Not on file    Attends meetings of clubs or organizations: Not on file    Relationship status: Not on file  . Intimate partner violence:    Fear of current or ex partner: Not on file    Emotionally abused: Not on file    Physically abused: Not on file    Forced sexual activity:  Not on file  Other Topics Concern  . Not on file  Social History Narrative   Lives with her husband and their 2 children   Her son is wheelchair bound due to spina bifida     Observations/Objective: No distress on phone.   Assessment and Plan: Abdominal pain, epigastric - Plan: DISCONTINUED: pantoprazole (PROTONIX) 40 MG tablet  History of gastric ulcer - Plan: DISCONTINUED: pantoprazole (PROTONIX) 40 MG tablet  Chronic constipation  Based on symptoms and concern for possible ulcer, reverted to in person appointment for following day  Follow Up Instructions: Patient Instructions       If you have lab work done today you will be contacted  with your lab results within the next 2 weeks.  If you have not heard from [place] then please contact us. The fastest way to get your results is to register for My Chart.   IF you received an x-ray today, you will receive an invoice from [facility]. Please contact [facility] at [phone] with questions or concerns regarding your invoice.   IF you received labwork today, you will receive an invoice from [facility]. Please contact [facility] at [phone] with questions or concerns regarding your invoice.   Our billing staff will not be able to assist you with questions regarding bills from these companies.  You will be contacted with the lab results as soon as they are available. The fastest way to get your results is to activate your My Chart account. Instructions are located on the last page of this paperwork. If you have not heard from [place] regarding the results in 2 weeks, please contact this office.         I discussed the assessment and treatment plan with the patient. The patient was provided an opportunity to ask questions and all were answered. The patient agreed with the plan and demonstrated an understanding of the instructions.   The patient was advised to call back or seek an in-person evaluation if the  symptoms worsen or if the condition fails to improve as anticipated.  I provided n/a minutes of non-face-to-face time during this encounter.  Signed,   Merri Ray, MD [facility].  07/14/18

## 2018-07-15 ENCOUNTER — Encounter: Payer: Self-pay | Admitting: Family Medicine

## 2018-07-15 ENCOUNTER — Other Ambulatory Visit: Payer: Self-pay

## 2018-07-15 ENCOUNTER — Ambulatory Visit (INDEPENDENT_AMBULATORY_CARE_PROVIDER_SITE_OTHER): Payer: No Typology Code available for payment source | Admitting: Family Medicine

## 2018-07-15 VITALS — BP 104/70 | HR 84 | Temp 98.0°F | Resp 14 | Ht 66.0 in | Wt 183.2 lb

## 2018-07-15 DIAGNOSIS — R1013 Epigastric pain: Secondary | ICD-10-CM | POA: Diagnosis not present

## 2018-07-15 DIAGNOSIS — Z8719 Personal history of other diseases of the digestive system: Secondary | ICD-10-CM | POA: Diagnosis not present

## 2018-07-15 DIAGNOSIS — Z8711 Personal history of peptic ulcer disease: Secondary | ICD-10-CM

## 2018-07-15 LAB — POCT CBC
Granulocyte percent: 44.2 %G (ref 37–80)
HCT, POC: 34.1 % (ref 29–41)
Hemoglobin: 11 g/dL (ref 11–14.6)
Lymph, poc: 2.9 (ref 0.6–3.4)
MCH, POC: 26.1 pg — AB (ref 27–31.2)
MCHC: 32.4 g/dL (ref 31.8–35.4)
MCV: 80.5 fL (ref 76–111)
MID (cbc): 0.3 (ref 0–0.9)
MPV: 8.5 fL (ref 0–99.8)
POC Granulocyte: 2.5 (ref 2–6.9)
POC LYMPH PERCENT: 51.4 %L — AB (ref 10–50)
POC MID %: 4.4 %M (ref 0–12)
Platelet Count, POC: 359 10*3/uL (ref 142–424)
RBC: 4.24 M/uL (ref 4.04–5.48)
RDW, POC: 13.4 %
WBC: 5.7 10*3/uL (ref 4.6–10.2)

## 2018-07-15 LAB — IFOBT (OCCULT BLOOD): IFOBT: NEGATIVE

## 2018-07-15 MED ORDER — PANTOPRAZOLE SODIUM 40 MG PO TBEC: 40.0000 mg | DELAYED_RELEASE_TABLET | Freq: Two times a day (BID) | ORAL | 0 refills | Status: DC

## 2018-07-15 MED FILL — PANTOPRAZOLE SOD DR 40 MG T: 40 | 30 days supply | Qty: 60 | Fill #0

## 2018-07-15 NOTE — Patient Instructions (Addendum)
Start Protonix twice per day for possible recurrence of gastric ulcer.  I did place a referral to see gastroenterology, Dr. Dulce Sellar.  If you have not heard from their office in the next week, let me know.  If any lightheadedness, dizziness, or other worsening symptoms be seen right away.  Prescription given to have CMP and lipase drawn at your lab.  Let me know if there are any problems in getting that obtained.  Continue stool softener,  see other information below for constipation but can also be discussed with Dr. Dulce Sellar.    Abdominal Pain, Adult Abdominal pain can be caused by many things. Often, abdominal pain is not serious and it gets better with no treatment or by being treated at home. However, sometimes abdominal pain is serious. Your health care provider will do a medical history and a physical exam to try to determine the cause of your abdominal pain. Follow these instructions at home:  Take over-the-counter and prescription medicines only as told by your health care provider. Do not take a laxative unless told by your health care provider.  Drink enough fluid to keep your urine clear or pale yellow.  Watch your condition for any changes.  Keep all follow-up visits as told by your health care provider. This is important. Contact a health care provider if:  Your abdominal pain changes or gets worse.  You are not hungry or you lose weight without trying.  You are constipated or have diarrhea for more than 2-3 days.  You have pain when you urinate or have a bowel movement.  Your abdominal pain wakes you up at night.  Your pain gets worse with meals, after eating, or with certain foods.  You are throwing up and cannot keep anything down.  You have a fever. Get help right away if:  Your pain does not go away as soon as your health care provider told you to expect.  You cannot stop throwing up.  Your pain is only in areas of the abdomen, such as the right side or the  left lower portion of the abdomen.  You have bloody or black stools, or stools that look like tar.  You have severe pain, cramping, or bloating in your abdomen.  You have signs of dehydration, such as: ? Dark urine, very little urine, or no urine. ? Cracked lips. ? Dry mouth. ? Sunken eyes. ? Sleepiness. ? Weakness. This information is not intended to replace advice given to you by your health care provider. Make sure you discuss any questions you have with your health care provider. Document Released: 12/13/2004 Document Revised: 09/23/2015 Document Reviewed: 08/17/2015 Elsevier Interactive Patient Education  2019 [employer].   Constipation, Adult Constipation is when a person has fewer bowel movements in a week than normal, has difficulty having a bowel movement, or has stools that are dry, hard, or larger than normal. Constipation may be caused by an underlying condition. It may become worse with age if a person takes certain medicines and does not take in enough fluids. Follow these instructions at home: Eating and drinking   Eat foods that have a lot of fiber, such as fresh fruits and vegetables, whole grains, and beans.  Limit foods that are high in fat, low in fiber, or overly processed, such as french fries, hamburgers, cookies, candies, and soda.  Drink enough fluid to keep your urine clear or pale yellow. General instructions  Exercise regularly or as told by your health care provider.  Go to the restroom when you have the urge to go. Do not hold it in.  Take over-the-counter and prescription medicines only as told by your health care provider. These include any fiber supplements.  Practice pelvic floor retraining exercises, such as deep breathing while relaxing the lower abdomen and pelvic floor relaxation during bowel movements.  Watch your condition for any changes.  Keep all follow-up visits as told by your health care provider. This is important. Contact a  health care provider if:  You have pain that gets worse.  You have a fever.  You do not have a bowel movement after 4 days.  You vomit.  You are not hungry.  You lose weight.  You are bleeding from the anus.  You have thin, pencil-like stools. Get help right away if:  You have a fever and your symptoms suddenly get worse.  You leak stool or have blood in your stool.  Your abdomen is bloated.  You have severe pain in your abdomen.  You feel dizzy or you faint. This information is not intended to replace advice given to you by your health care provider. Make sure you discuss any questions you have with your health care provider. Document Released: 12/02/2003 Document Revised: 09/23/2015 Document Reviewed: 08/24/2015 Elsevier Interactive Patient Education  [employer]2019 [employer]   If you have lab work done today you will be contacted with your lab results within the next 2 weeks.  If you have not heard from us then please contact us. The fastest way to get your results is to register for My Chart.   IF you received an x-ray today, you will receive an invoice [facility]. Please contact [facility] at [phone] with questions or concerns regarding your invoice.   IF you received labwork today, you will receive an invoice from [facility]. Please contact [facility] at [phone] with questions or concerns regarding your invoice.   Our billing staff will not be able to assist you with questions regarding bills from these companies.  You will be contacted with the lab results as soon as they are available. The fastest way to get your results is to activate your My Chart account. Instructions are located on the last page of this paperwork. If you have not heard from us regarding the results in 2 weeks, please contact this office.

## 2018-07-15 NOTE — Progress Notes (Signed)
Subjective:    Patient ID: Rachel [name], female    DOB: 06-Sep-1975, 43 y.o.   MRN: [id number]  HPI Vester Titsworth is a 43 y.o. female Presents today for: Chief Complaint  Patient presents with  . Abdominal Pain    HAd a telemed visit yesterday and told to come into office to have further workup. Pain is no better.   Initial plan for telemedicine visit yesterday.  During that visit determined best option was in person appointment for evaluation and lab work.  History summarized from yesterday with additions today:  Started having abdominal pain 2 weeks ago. Notes just below rib area. Vomiting a few times last week- no blood or coffee grounds.  No vomiting this week. No fever. Feels exactly the same as few years ago when had stomach ulcer - about 2014 or 2015. . Treated for H pylori at that time.  Chronic constipation, about the same recently - BM once per week. Using senokot 2 pills per day. Still only 1 BM per week, sometimes 2 times per week. No changes in constipation.  Noted dark stools - noticed this on and off for past month.  No lightheadededness or dizziness. No PPI at this time. - protonix inadvertently sent to [facility] pharmacy instead of [facility] yesterday. More stress recently with work. 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  Ernie Avena recent eval with Dr. Paulita Fujita (GI). Prior ulcer had improved on repeat endoscopy in 2015.  Had not been having any pain until few weeks ago.   No tobacco, or alcohol.  No recent nsaids - naproxen once per month for mestrual cramping.  Improves with mint tea, warm milk, better with eating.   Patient Active Problem List   Diagnosis Date Noted  . Anxiety 12/24/2017  . Adult situational stress disorder 06/26/2017  . Chronic migraine 12/28/2016  . Prediabetes 12/12/2016  . BMI 25.0-25.9,adult 11/01/2015  . Insomnia 11/01/2015  . Orthostatic hypotension 08/17/2015  . 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 Atypical chest pain 08/17/2015  . Hyperlipidemia  07/24/2015   Past Medical History:  Diagnosis Date  . Atypical chest pain 08/17/2015  . 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 Migraines   . Orthostatic hypotension 08/17/2015  . Prediabetes   . PUD (peptic ulcer disease)    Past Surgical History:  Procedure Laterality Date  . CESAREAN SECTION     No Known Allergies Prior to Admission medications   Medication Sig Start Date End Date Taking? Authorizing Provider  atorvastatin (LIPITOR) 80 MG tablet TAKE 1 TABLET BY MOUTH DAILY. 06/09/18  Yes Grant Fontana M, MD  glucose blood (FREESTYLE LITE) test strip Use once daily to check blood sugar 12/18/16  Yes Jeffery, Chelle, PA  glucose monitoring kit (FREESTYLE) monitoring kit 1 each by Does not apply route daily. 12/18/16  Yes Jeffery, Chelle, PA  metFORMIN (GLUCOPHAGE-XR) 750 MG 24 hr tablet TAKE 1 TABLET (750 MG TOTAL) BY MOUTH DAILY WITH BREAKFAST. 06/09/18  Yes Rutherford Guys, MD  Multiple Vitamin (MULTIVITAMIN WITH MINERALS) TABS tablet Take 1 tablet by mouth daily.   Yes [provider]  vitamin B-12 (CYANOCOBALAMIN) 100 MCG tablet Take 100 mcg by mouth daily.   Yes [provider]  pantoprazole (PROTONIX) 40 MG tablet Take 1 tablet (40 mg total) by mouth 2 (two) times daily. 07/15/18   Wendie Agreste, MD   Social History   Socioeconomic History  . Marital status: Married    Spouse name: Not on file  . Number of children: 2  .  Years of education: Not on file  . Highest education level: Not on file  Occupational History  . 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 Financial resource strain: Not on file  . Food insecurity:    Worry: Not on file    Inability: Not on file  . Transportation needs:    Medical: Not on file    Non-medical: Not on file  Tobacco Use  . Smoking status: Never Smoker  . Smokeless tobacco: Never Used  Substance and Sexual Activity  . Alcohol use: No  . Drug use: No  . Sexual activity: Yes    Birth  control/protection: Condom  Lifestyle  . Physical activity:    Days per week: Not on file    Minutes per session: Not on file  . Stress: Not on file  Relationships  . Social connections:    Talks on phone: Not on file    Gets together: Not on file    Attends religious service: Not on file    Active member of club or organization: Not on file    Attends meetings of clubs or organizations: Not on file    Relationship status: Not on file  . Intimate partner violence:    Fear of current or ex partner: Not on file    Emotionally abused: Not on file    Physically abused: Not on file    Forced sexual activity: Not on file  Other Topics Concern  . Not on file  Social History Narrative   Lives with her husband and their 2 children   Her son is wheelchair bound due to spina bifida    Review of Systems Per HPI.     Objective:   Physical Exam Vitals signs reviewed.  Constitutional:      Appearance: She is well-developed.  HENT:     Head: Normocephalic and atraumatic.  Eyes:     Conjunctiva/sclera: Conjunctivae normal.     Pupils: Pupils are equal, round, and reactive to light.  Neck:     Vascular: No carotid bruit.  Cardiovascular:     Rate and Rhythm: Normal rate and regular rhythm.     Heart sounds: Normal heart sounds.  Pulmonary:     Effort: Pulmonary effort is normal.     Breath sounds: Normal breath sounds.  Abdominal:     Palpations: Abdomen is soft. There is no pulsatile mass.     Tenderness: There is abdominal tenderness in the epigastric area. There is no right CVA tenderness, left CVA tenderness, guarding or rebound.  Skin:    General: Skin is warm and dry.  Neurological:     Mental Status: She is alert and oriented to person, place, and time.  Psychiatric:        Behavior: Behavior normal.    Vitals:   07/15/18 1606  BP: 104/70  Pulse: 84  Resp: 14  Temp: 98 F (36.7 C)  TempSrc: Oral  SpO2: 96%  Weight: 183 lb 3.2 oz (83.1 kg)  Height: _0  (1.676  m)   Heme-negative stool confirmed by DRE, performed by female provider, Dr. Holly Bodily.     Results for orders placed or performed in visit on 07/15/18  POCT CBC  Result Value Ref Range   WBC 5.7 4.6 - 10.2 K/uL   Lymph, poc 2.9 0.6 - 3.4   POC LYMPH PERCENT 51.4 (A) 10 - 50 %L   MID (cbc) 0.3 0 - 0.9   POC MID %  4.4 0 - 12 %M   POC Granulocyte 2.5 2 - 6.9   Granulocyte percent 44.2 37 - 80 %G   RBC 4.24 4.04 - 5.48 M/uL   Hemoglobin 11.0 11 - 14.6 g/dL   HCT, POC 34.1 29 - 41 %   MCV 80.5 76 - 111 fL   MCH, POC 26.1 (A) 27 - 31.2 pg   MCHC 32.4 31.8 - 35.4 g/dL   RDW, POC 13.4 %   Platelet Count, POC 359 142 - 424 K/uL   MPV 8.5 0 - 99.8 fL   Assessment & Plan:    Jerae Fortin is a 43 y.o. female Abdominal pain, epigastric - Plan: POCT CBC, pantoprazole (PROTONIX) 40 MG tablet, Ambulatory referral to Gastroenterology, IFOBT POC (occult bld, rslt in office), IFOBT POC (occult bld, rslt in office), DISCONTINUED: pantoprazole (PROTONIX) 40 MG tablet, CANCELED: Comprehensive metabolic panel, CANCELED: Lipase, CANCELED: H. pylori breath test  History of gastric ulcer - Plan: pantoprazole (PROTONIX) 40 MG tablet, Ambulatory referral to Gastroenterology, IFOBT POC (occult bld, rslt in office), IFOBT POC (occult bld, rslt in office), DISCONTINUED: pantoprazole (PROTONIX) 40 MG tablet, CANCELED: Comprehensive metabolic panel, CANCELED: Lipase, CANCELED: H. pylori breath test  Epigastric abdominal pain, concerning for repeat episode of gastric ulcer or gastritis/peptic ulcer disease.  Prior history of H. pylori.  Hemoglobin okay, vital signs reassuring.  Denies recent NSAID use/frequent NSAID use, alcohol, or other known predisposing factors other than situational stressors.  -Start Protonix 40 mg twice daily, check H. pylori testing, CMP, lipase.  -Referral placed to gastroenterology  -ER precautions if any acute worsening.  Chronic constipation  -May be contributing somewhat to abdominal  discomfort, but still suspicious of gastritis/ulcer as primary cause.  -Stool softener, fiber, fluids and diet and follow-up with gastroenterology.  Handout given.  Meds ordered this encounter  Medications  . DISCONTD: pantoprazole (PROTONIX) 40 MG tablet    Sig: Take 1 tablet (40 mg total) by mouth 2 (two) times daily.    Dispense:  60 tablet    Refill:  0  . pantoprazole (PROTONIX) 40 MG tablet    Sig: Take 1 tablet (40 mg total) by mouth 2 (two) times daily.    Dispense:  60 tablet    Refill:  0   Patient Instructions    Start Protonix twice per day for possible recurrence of gastric ulcer.  I did place a referral to see gastroenterology, Dr. Paulita Fujita.  If you have not heard from their office in the next week, let me know.  If any lightheadedness, dizziness, or other worsening symptoms be seen right away.  Prescription given to have CMP and lipase drawn at your lab.  Let me know if there are any problems in getting that obtained.  Continue stool softener,  see other information below for constipation but can also be discussed with Dr. Paulita Fujita.    Abdominal Pain, Adult Abdominal pain can be caused by many things. Often, abdominal pain is not serious and it gets better with no treatment or by being treated at home. However, sometimes abdominal pain is serious. Your health care provider will do a medical history and a physical exam to try to determine the cause of your abdominal pain. Follow these instructions at home:  Take over-the-counter and prescription medicines only as told by your health care provider. Do not take a laxative unless told by your health care provider.  Drink enough fluid to keep your urine clear or pale yellow.  Watch your condition  for any changes.  Keep all follow-up visits as told by your health care provider. This is important. Contact a health care provider if:  Your abdominal pain changes or gets worse.  You are not hungry or you lose weight without  trying.  You are constipated or have diarrhea for more than 2-3 days.  You have pain when you urinate or have a bowel movement.  Your abdominal pain wakes you up at night.  Your pain gets worse with meals, after eating, or with certain foods.  You are throwing up and cannot keep anything down.  You have a fever. Get help right away if:  Your pain does not go away as soon as your health care provider told you to expect.  You cannot stop throwing up.  Your pain is only in areas of the abdomen, such as the right side or the left lower portion of the abdomen.  You have bloody or black stools, or stools that look like tar.  You have severe pain, cramping, or bloating in your abdomen.  You have signs of dehydration, such as: ? Dark urine, very little urine, or no urine. ? Cracked lips. ? Dry mouth. ? Sunken eyes. ? Sleepiness. ? Weakness. This information is not intended to replace advice given to you by your health care provider. Make sure you discuss any questions you have with your health care provider. Document Released: 12/13/2004 Document Revised: 09/23/2015 Document Reviewed: 08/17/2015 Elsevier Interactive Patient Education  2019 [employer].   Constipation, Adult Constipation is when a person has fewer bowel movements in a week than normal, has difficulty having a bowel movement, or has stools that are dry, hard, or larger than normal. Constipation may be caused by an underlying condition. It may become worse with age if a person takes certain medicines and does not take in enough fluids. Follow these instructions at home: Eating and drinking   Eat foods that have a lot of fiber, such as fresh fruits and vegetables, whole grains, and beans.  Limit foods that are high in fat, low in fiber, or overly processed, such as french fries, hamburgers, cookies, candies, and soda.  Drink enough fluid to keep your urine clear or pale yellow. General instructions  Exercise  regularly or as told by your health care provider.  Go to the restroom when you have the urge to go. Do not hold it in.  Take over-the-counter and prescription medicines only as told by your health care provider. These include any fiber supplements.  Practice pelvic floor retraining exercises, such as deep breathing while relaxing the lower abdomen and pelvic floor relaxation during bowel movements.  Watch your condition for any changes.  Keep all follow-up visits as told by your health care provider. This is important. Contact a health care provider if:  You have pain that gets worse.  You have a fever.  You do not have a bowel movement after 4 days.  You vomit.  You are not hungry.  You lose weight.  You are bleeding from the anus.  You have thin, pencil-like stools. Get help right away if:  You have a fever and your symptoms suddenly get worse.  You leak stool or have blood in your stool.  Your abdomen is bloated.  You have severe pain in your abdomen.  You feel dizzy or you faint. This information is not intended to replace advice given to you by your health care provider. Make sure you discuss any questions you have  with your health care provider. Document Released: 12/02/2003 Document Revised: 09/23/2015 Document Reviewed: 08/24/2015 Elsevier Interactive Patient Education  [employer].   If you have lab work done today you will be contacted with your lab results within the next 2 weeks.  If you have not heard from [place] then please contact us. The fastest way to get your results is to register for My Chart.   IF you received an x-ray today, you will receive an invoice from [facility]. Please contact [facility] at [phone] with questions or concerns regarding your invoice.   IF you received labwork today, you will receive an invoice from [facility]. Please contact [facility] at [phone] with questions or concerns regarding your  invoice.   Our billing staff will not be able to assist you with questions regarding bills from these companies.  You will be contacted with the lab results as soon as they are available. The fastest way to get your results is to activate your My Chart account. Instructions are located on the last page of this paperwork. If you have not heard from [place] regarding the results in 2 weeks, please contact this office.       Signed,   Merri Ray, MD Primary Care at [place].  07/16/18 3:25 PM

## 2018-07-16 ENCOUNTER — Encounter: Payer: Self-pay | Admitting: Family Medicine

## 2018-07-16 LAB — COMPREHENSIVE METABOLIC PANEL
ALT: 12 IU/L (ref 0–32)
AST: 16 IU/L (ref 0–40)
Albumin/Globulin Ratio: 1.5 (ref 1.2–2.2)
Albumin: 4.5 g/dL (ref 3.8–4.8)
Alkaline Phosphatase: 72 IU/L (ref 39–117)
BUN/Creatinine Ratio: 17 (ref 9–23)
BUN: 8 mg/dL (ref 6–24)
Bilirubin Total: 0.8 mg/dL (ref 0.0–1.2)
CO2: 22 mmol/L (ref 20–29)
Calcium: 9.1 mg/dL (ref 8.7–10.2)
Chloride: 99 mmol/L (ref 96–106)
Creatinine, Ser: 0.48 mg/dL — ABNORMAL LOW (ref 0.57–1.00)
GFR calc Af Amer: 141 mL/min/{1.73_m2} (ref >59)
GFR calc non Af Amer: 122 mL/min/{1.73_m2} (ref >59)
Globulin, Total: 3 g/dL (ref 1.5–4.5)
Glucose: 101 mg/dL — ABNORMAL HIGH (ref 65–99)
Potassium: 3.6 mmol/L (ref 3.5–5.2)
Sodium: 139 mmol/L (ref 134–144)
Total Protein: 7.5 g/dL (ref 6.0–8.5)

## 2018-07-16 LAB — LIPASE: Lipase: 25 U/L (ref 14–72)

## 2018-07-17 ENCOUNTER — Encounter: Payer: Self-pay | Admitting: Family Medicine

## 2018-07-23 ENCOUNTER — Other Ambulatory Visit: Payer: Self-pay

## 2018-07-23 DIAGNOSIS — R1013 Epigastric pain: Secondary | ICD-10-CM

## 2018-08-29 ENCOUNTER — Telehealth: Payer: Self-pay | Admitting: Family Medicine

## 2018-08-29 NOTE — Telephone Encounter (Signed)
Copied from [place] [id number]. Topic: Quick Communication - See Telephone Encounter >> Aug 29, 2018  3:47 PM Blase Mess A wrote: CRM for notification. See Telephone encounter for: 08/29/18.  Amy from [facility]  the patient needs to contact with Business service dept before an appt can be scheduled. There is a billing issue. Thank you. [employer] Service- [phone]

## 2018-09-02 ENCOUNTER — Telehealth: Payer: Self-pay | Admitting: Family Medicine

## 2018-09-02 NOTE — Telephone Encounter (Signed)
Copied from [place] [phone]. Topic: Quick Communication - See Telephone Encounter >> Aug 29, 2018  3:47 PM Blase Mess A wrote: CRM for notification. See Telephone encounter for: 08/29/18.  Amy from [facility]  the patient needs to contact with Business service dept before an appt can be scheduled. There is a billing issue. Thank you.

## 2018-09-29 ENCOUNTER — Other Ambulatory Visit: Payer: Self-pay

## 2018-09-29 ENCOUNTER — Telehealth (INDEPENDENT_AMBULATORY_CARE_PROVIDER_SITE_OTHER): Payer: No Typology Code available for payment source | Admitting: Family Medicine

## 2018-09-29 DIAGNOSIS — J029 Acute pharyngitis, unspecified: Secondary | ICD-10-CM

## 2018-09-29 MED ORDER — AMOXICILLIN 500 MG PO CAPS: 500.0000 mg | ORAL_CAPSULE | Freq: Two times a day (BID) | ORAL | 0 refills | Status: DC

## 2018-09-29 NOTE — Progress Notes (Signed)
Virtual Visit Note  I connected with patient on 09/29/18 at 552pm by phoneand verified that I am speaking with the correct person using two identifiers. Rachel [name] is currently located at home and patient is currently with them during visit. The provider, Rutherford Guys, MD is located in their office at time of visit.  I discussed the limitations, risks, security and privacy concerns of performing an evaluation and management service by telephone and the availability of in person appointments. I also discussed with the patient that there may be a patient responsible charge related to this service. The patient expressed understanding and agreed to proceed.   CC: sore throat  HPI ? She is a [employer] employee Wednesday evening had scratchy throat Woke up with very sore throat Had nasal congestion x 1, no cough Had fever x 1 101.2 Feels glands are swollen Tonsils are red and swollen, a couple days ago white spots and touching each other No SOB Denies any sneezing, itchiness, watery eyes Was tested thru health at work on Thursday Reports negative covid test This feels like previous strep infection, last infection winter Has been using tylenol, salt water gargle, tea with lemon Not getting better Requesting antibiotics   No Known Allergies  Prior to Admission medications   Medication Sig Start Date End Date Taking? Authorizing Provider  atorvastatin (LIPITOR) 80 MG tablet TAKE 1 TABLET BY MOUTH DAILY. 06/09/18   Rutherford Guys, MD  glucose blood (FREESTYLE LITE) test strip Use once daily to check blood sugar 12/18/16   Harrison Mons, PA  glucose monitoring kit (FREESTYLE) monitoring kit 1 each by Does not apply route daily. 12/18/16   Harrison Mons, PA  metFORMIN (GLUCOPHAGE-XR) 750 MG 24 hr tablet TAKE 1 TABLET (750 MG TOTAL) BY MOUTH DAILY WITH BREAKFAST. 06/09/18   Rutherford Guys, MD  Multiple Vitamin (MULTIVITAMIN WITH MINERALS) TABS tablet Take 1 tablet by mouth  daily.    [provider]  pantoprazole (PROTONIX) 40 MG tablet Take 1 tablet (40 mg total) by mouth 2 (two) times daily. 07/15/18   Wendie Agreste, MD  vitamin B-12 (CYANOCOBALAMIN) 100 MCG tablet Take 100 mcg by mouth daily.    [provider]    Past Medical History:  Diagnosis Date  . Atypical chest pain 08/17/2015  . 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 Migraines   . Orthostatic hypotension 08/17/2015  . Prediabetes   . PUD (peptic ulcer disease)     Past Surgical History:  Procedure Laterality Date  . CESAREAN SECTION      Social History   Tobacco Use  . Smoking status: Never Smoker  . Smokeless tobacco: Never Used  Substance Use Topics  . Alcohol use: No    Family History  Problem Relation Age of Onset  . Spina bifida Son   . Hyperlipidemia Daughter   . Hyperlipidemia Mother   . Heart disease Sister   . Hyperlipidemia Sister   . Hyperlipidemia Other   . Breast cancer Paternal Aunt     ROS Per hpi  Objective  Vitals as reported by the patient: none   ASSESSMENT and PLAN  1. Acute pharyngitis, unspecified etiology  Other orders - amoxicillin (AMOXIL) 500 MG capsule; Take 1 capsule (500 mg total) by mouth 2 (two) times daily.  FOLLOW-UP: prn   The above assessment and management plan was discussed with the patient. The patient verbalized understanding of and has agreed to the management plan. Patient is aware to call the clinic  if symptoms persist or worsen. Patient is aware when to return to the clinic for a follow-up visit. Patient educated on when it is appropriate to go to the emergency department.    I provided 11 minutes of non-face-to-face time during this encounter.  Rutherford Guys, MD Primary Care at [place], [place] [address] Ph.  [phone] Fax [phone]

## 2018-09-29 NOTE — Progress Notes (Signed)
Pt is following up with the covis symptoms she has been having. She currently has a sore throat and headache, low grade fever this past Saturday. The covid test came back neg,

## 2018-09-30 ENCOUNTER — Telehealth: Payer: Self-pay | Admitting: Cardiovascular Disease

## 2018-09-30 MED FILL — AMOXICILLIN 500 MG CAPSULE: 500 | 10 days supply | Qty: 20 | Fill #0

## 2018-09-30 NOTE — Telephone Encounter (Signed)
Lm about recall °

## 2018-11-03 ENCOUNTER — Other Ambulatory Visit: Payer: Self-pay | Admitting: Family Medicine

## 2018-11-03 ENCOUNTER — Telehealth: Payer: Self-pay | Admitting: Family Medicine

## 2018-11-03 DIAGNOSIS — E785 Hyperlipidemia, unspecified: Secondary | ICD-10-CM

## 2018-11-03 DIAGNOSIS — R7303 Prediabetes: Secondary | ICD-10-CM

## 2018-11-03 NOTE — Telephone Encounter (Signed)
The metformin is on back order. They have the ER 500 or regular metformin/ please advise on which change to make and resend Rx. Thank you

## 2018-11-03 NOTE — Telephone Encounter (Signed)
Please see pharmacy note about medication being on back order

## 2018-11-03 NOTE — Telephone Encounter (Signed)
Pharmacy called and stated that the metFORMIN (GLUCOPHAGE-XR) 750 MG 24 hr tablet Was on back order and they do not have it. But they have the ER 500 or regular metformin/ please advise on which change to make and resend Rx

## 2018-11-04 MED ORDER — METFORMIN HCL 850 MG PO TABS: 850.0000 mg | ORAL_TABLET | Freq: Two times a day (BID) | ORAL | 3 refills | Status: DC

## 2018-11-04 NOTE — Telephone Encounter (Signed)
Please let patient know that I have sent a prescription for IR metformin 850mg  twice a day. thanks

## 2018-11-17 ENCOUNTER — Encounter: Payer: No Typology Code available for payment source | Admitting: Family Medicine

## 2018-11-18 ENCOUNTER — Ambulatory Visit: Payer: No Typology Code available for payment source | Admitting: Family Medicine

## 2018-11-20 ENCOUNTER — Other Ambulatory Visit: Payer: Self-pay

## 2018-11-20 ENCOUNTER — Encounter: Payer: Self-pay | Admitting: Family Medicine

## 2018-11-20 ENCOUNTER — Ambulatory Visit (INDEPENDENT_AMBULATORY_CARE_PROVIDER_SITE_OTHER): Payer: No Typology Code available for payment source | Admitting: Family Medicine

## 2018-11-20 ENCOUNTER — Other Ambulatory Visit: Payer: Self-pay | Admitting: Family Medicine

## 2018-11-20 VITALS — BP 107/75 | HR 88 | Temp 98.4°F | Ht 66.0 in | Wt 185.0 lb

## 2018-11-20 DIAGNOSIS — R7303 Prediabetes: Secondary | ICD-10-CM

## 2018-11-20 DIAGNOSIS — Z0000 Encounter for general adult medical examination without abnormal findings: Secondary | ICD-10-CM

## 2018-11-20 DIAGNOSIS — K219 Gastro-esophageal reflux disease without esophagitis: Secondary | ICD-10-CM | POA: Diagnosis not present

## 2018-11-20 DIAGNOSIS — E785 Hyperlipidemia, unspecified: Secondary | ICD-10-CM | POA: Diagnosis not present

## 2018-11-20 DIAGNOSIS — Z0001 Encounter for general adult medical examination with abnormal findings: Secondary | ICD-10-CM | POA: Diagnosis not present

## 2018-11-20 MED ORDER — PANTOPRAZOLE SODIUM 40 MG PO TBEC: 40.0000 mg | DELAYED_RELEASE_TABLET | Freq: Two times a day (BID) | ORAL | 2 refills | Status: DC

## 2018-11-20 MED ORDER — ATORVASTATIN CALCIUM 80 MG PO TABS: 80.0000 mg | ORAL_TABLET | Freq: Every day | ORAL | 3 refills | Status: DC

## 2018-11-20 NOTE — Progress Notes (Signed)
9/3/20204:27 PM  Rachel [name] 05/22/1975, 43 y.o., female [id number]  Chief Complaint  Patient presents with  . Annual Exam    wants to talk about the chronic headaches that she has been having, taking tylenol for now    HPI:   Patient is a 43 y.o. female with past medical history significant for GAD, prediabetes, HLP who presents today for CPE  G&Ps: 2002 Pap: 2017 with neg HPV BC : condoms Menses: regular, 8-9 days, sometimes heavy Mammogram: none FHX breast/ovarian cancer: paternal aunt breast cancer in her 25s FHx colon cancer: none Exercise/diet: exercising some, home cooked meals  Due for dental appt Sees ohptho every nov   Wt Readings from Last 3 Encounters:  11/20/18 185 lb (83.9 kg)  07/15/18 183 lb 3.2 oz (83.1 kg)  07/14/18 178 lb (80.7 kg)   Most Recent Immunizations  Administered Date(s) Administered  . Influenza-Unspecified 12/13/2015  gets flu vaccine at work she had Td in 2015 at work  Depression screen [facility] 2/9 11/20/2018 07/15/2018 07/14/2018  Decreased Interest 0 0 0  Down, Depressed, Hopeless 0 0 0  PHQ - 2 Score 0 0 0    Fall Risk  11/20/2018 07/15/2018 07/14/2018 01/31/2018 12/04/2017  Falls in the past year? 0 0 0 0 No  Number falls in past yr: 0 0 0 - -  Injury with Fall? 0 0 0 - -  Follow up - - Falls evaluation completed - -     No Known Allergies  Prior to Admission medications   Medication Sig Start Date End Date Taking? Authorizing Provider  atorvastatin (LIPITOR) 80 MG tablet TAKE 1 TABLET BY MOUTH DAILY. 11/03/18  Yes Grant Fontana M, MD  glucose blood (FREESTYLE LITE) test strip Use once daily to check blood sugar 12/18/16  Yes Jeffery, Chelle, PA  glucose monitoring kit (FREESTYLE) monitoring kit 1 each by Does not apply route daily. 12/18/16  Yes Harrison Mons, PA  metFORMIN (GLUCOPHAGE) 850 MG tablet Take 1 tablet (850 mg total) by mouth 2 (two) times daily with a meal. 11/04/18  Yes Rutherford Guys, MD  Multiple Vitamin  (MULTIVITAMIN WITH MINERALS) TABS tablet Take 1 tablet by mouth daily.   Yes [provider]    Past Medical History:  Diagnosis Date  . Atypical chest pain 08/17/2015  . 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 Migraines   . Orthostatic hypotension 08/17/2015  . Prediabetes   . PUD (peptic ulcer disease)     Past Surgical History:  Procedure Laterality Date  . CESAREAN SECTION      Social History   Tobacco Use  . Smoking status: Never Smoker  . Smokeless tobacco: Never Used  Substance Use Topics  . Alcohol use: No    Family History  Problem Relation Age of Onset  . Spina bifida Son   . Hyperlipidemia Daughter   . Hyperlipidemia Mother   . Heart disease Sister   . Hyperlipidemia Sister   . Hyperlipidemia Other   . Breast cancer Paternal Aunt     Review of Systems  Constitutional: Negative for chills, fever and malaise/fatigue.  Eyes: Negative for blurred vision and double vision.  Respiratory: Negative for cough and shortness of breath.   Cardiovascular: Negative for chest pain, palpitations and leg swelling.  Gastrointestinal: Negative for abdominal pain, nausea and vomiting.  Neurological: Positive for dizziness and headaches. Negative for focal weakness and loss of consciousness.  Psychiatric/Behavioral: Negative for depression and memory loss. The patient is not nervous/anxious and does  not have insomnia.   All other systems reviewed and are negative.    OBJECTIVE:  Today's Vitals   11/20/18 1620  BP: 107/75  Pulse: 88  Temp: 98.4 F (36.9 C)  SpO2: 97%  Weight: 185 lb (83.9 kg)  Height: '5\' 6"'$  (1.676 m)   Body mass index is 29.86 kg/m.   Hearing Screening   '125Hz'$  '250Hz'$  '500Hz'$  '1000Hz'$  '2000Hz'$  '3000Hz'$  '4000Hz'$  '6000Hz'$  '8000Hz'$   Right ear:           Left ear:             Visual Acuity Screening   Right eye Left eye Both eyes  Without correction:     With correction: '20/25 20/20 20/15 '$    Physical Exam Vitals signs and nursing note reviewed. Exam  conducted with a chaperone present.  Constitutional:      Appearance: She is well-developed.  HENT:     Head: Normocephalic and atraumatic.     Right Ear: Hearing, tympanic membrane, ear canal and external ear normal.     Left Ear: Hearing, tympanic membrane, ear canal and external ear normal.     Mouth/Throat:     Mouth: Mucous membranes are moist.     Pharynx: No oropharyngeal exudate or posterior oropharyngeal erythema.  Eyes:     Extraocular Movements: Extraocular movements intact.     Conjunctiva/sclera: Conjunctivae normal.     Pupils: Pupils are equal, round, and reactive to light.  Neck:     Musculoskeletal: Neck supple.     Thyroid: No thyromegaly.  Cardiovascular:     Rate and Rhythm: Normal rate and regular rhythm.     Heart sounds: Normal heart sounds. No murmur. No friction rub. No gallop.   Pulmonary:     Effort: Pulmonary effort is normal.     Breath sounds: Normal breath sounds. No wheezing, rhonchi or rales.  Chest:     Breasts:        Right: Normal. No mass, nipple discharge or skin change.        Left: Normal. No mass, nipple discharge or skin change.  Abdominal:     General: Bowel sounds are normal. There is no distension.     Palpations: Abdomen is soft. There is no hepatomegaly, splenomegaly or mass.     Tenderness: There is no abdominal tenderness.  Musculoskeletal: Normal range of motion.     Right lower leg: No edema.     Left lower leg: No edema.  Lymphadenopathy:     Cervical: No cervical adenopathy.     Upper Body:     Right upper body: No supraclavicular, axillary or pectoral adenopathy.     Left upper body: No supraclavicular, axillary or pectoral adenopathy.  Skin:    General: Skin is warm and dry.  Neurological:     Mental Status: She is alert and oriented to person, place, and time.     Cranial Nerves: No cranial nerve deficit.     Gait: Gait normal.     Deep Tendon Reflexes: Reflexes are normal and symmetric.  Psychiatric:        Mood  and Affect: Mood normal.        Behavior: Behavior normal.     No results found for this or any previous visit (from the past 24 hour(s)).  No results found.   ASSESSMENT and PLAN  1. Annual physical exam Routine HCM labs ordered. HCM reviewed/discussed. Anticipatory guidance regarding healthy weight, lifestyle and choices given.    2.  Hyperlipidemia, unspecified hyperlipidemia type Checking labs today, medications will be adjusted as needed.  - atorvastatin (LIPITOR) 80 MG tablet; Take 1 tablet (80 mg total) by mouth daily. - lipid ordered  3. Gastroesophageal reflux disease without esophagitis Controlled. Continue current regime.  - pantoprazole (PROTONIX) 40 MG tablet; Take 1 tablet (40 mg total) by mouth 2 (two) times daily.  4. Prediabetes Checking labs today, medications will be adjusted as needed.  - a1c ordered  Return in about 6 months (around 05/20/2019).    Rutherford Guys, MD Primary Care at [place], [place] [address] Ph.  [phone] Fax [phone]

## 2018-11-20 NOTE — Patient Instructions (Addendum)
   If you have lab work done today you will be contacted with your lab results within the next 2 weeks.  If you have not heard from us then please contact us. The fastest way to get your results is to register for My Chart.   IF you received an x-ray today, you will receive an invoice from [facility]. Please contact [facility] at [phone] with questions or concerns regarding your invoice.   IF you received labwork today, you will receive an invoice from [facility]. Please contact [facility] at [phone] with questions or concerns regarding your invoice.   Our billing staff will not be able to assist you with questions regarding bills from these companies.  You will be contacted with the lab results as soon as they are available. The fastest way to get your results is to activate your My Chart account. Instructions are located on the last page of this paperwork. If you have not heard from us regarding the results in 2 weeks, please contact this office.     Preventive Care 40-64 Years Old, Female Preventive care refers to visits with your health care provider and lifestyle choices that can promote health and wellness. This includes:  A yearly physical exam. This may also be called an annual well check.  Regular dental visits and eye exams.  Immunizations.  Screening for certain conditions.  Healthy lifestyle choices, such as eating a healthy diet, getting regular exercise, not using drugs or products that contain nicotine and tobacco, and limiting alcohol use. What can I expect for my preventive care visit? Physical exam Your health care provider will check your:  Height and weight. This may be used to calculate body mass index (BMI), which tells if you are at a healthy weight.  Heart rate and blood pressure.  Skin for abnormal spots. Counseling Your health care provider may ask you questions about your:  Alcohol, tobacco, and drug use.  Emotional  well-being.  Home and relationship well-being.  Sexual activity.  Eating habits.  Work and work environment.  Method of birth control.  Menstrual cycle.  Pregnancy history. What immunizations do I need?  Influenza (flu) vaccine  This is recommended every year. Tetanus, diphtheria, and pertussis (Tdap) vaccine  You may need a Td booster every 10 years. Varicella (chickenpox) vaccine  You may need this if you have not been vaccinated. Zoster (shingles) vaccine  You may need this after age 60. Measles, mumps, and rubella (MMR) vaccine  You may need at least one dose of MMR if you were born in 1957 or later. You may also need a second dose. Pneumococcal conjugate (PCV13) vaccine  You may need this if you have certain conditions and were not previously vaccinated. Pneumococcal polysaccharide (PPSV23) vaccine  You may need one or two doses if you smoke cigarettes or if you have certain conditions. Meningococcal conjugate (MenACWY) vaccine  You may need this if you have certain conditions. Hepatitis A vaccine  You may need this if you have certain conditions or if you travel or work in places where you may be exposed to hepatitis A. Hepatitis B vaccine  You may need this if you have certain conditions or if you travel or work in places where you may be exposed to hepatitis B. Haemophilus influenzae type b (Hib) vaccine  You may need this if you have certain conditions. Human papillomavirus (HPV) vaccine  If recommended by your health care provider, you may need three doses over 6 months.   You may receive vaccines as individual doses or as more than one vaccine together in one shot (combination vaccines). Talk with your health care provider about the risks and benefits of combination vaccines. What tests do I need? Blood tests  Lipid and cholesterol levels. These may be checked every 5 years, or more frequently if you are over 50 years old.  Hepatitis C  test.  Hepatitis B test. Screening  Lung cancer screening. You may have this screening every year starting at age 55 if you have a 30-pack-year history of smoking and currently smoke or have quit within the past 15 years.  Colorectal cancer screening. All adults should have this screening starting at age 50 and continuing until age 75. Your health care provider may recommend screening at age 45 if you are at increased risk. You will have tests every 1-10 years, depending on your results and the type of screening test.  Diabetes screening. This is done by checking your blood sugar (glucose) after you have not eaten for a while (fasting). You may have this done every 1-3 years.  Mammogram. This may be done every 1-2 years. Talk with your health care provider about when you should start having regular mammograms. This may depend on whether you have a family history of breast cancer.  BRCA-related cancer screening. This may be done if you have a family history of breast, ovarian, tubal, or peritoneal cancers.  Pelvic exam and Pap test. This may be done every 3 years starting at age 21. Starting at age 30, this may be done every 5 years if you have a Pap test in combination with an HPV test. Other tests  Sexually transmitted disease (STD) testing.  Bone density scan. This is done to screen for osteoporosis. You may have this scan if you are at high risk for osteoporosis. Follow these instructions at home: Eating and drinking  Eat a diet that includes fresh fruits and vegetables, whole grains, lean protein, and low-fat dairy.  Take vitamin and mineral supplements as recommended by your health care provider.  Do not drink alcohol if: ? Your health care provider tells you not to drink. ? You are pregnant, may be pregnant, or are planning to become pregnant.  If you drink alcohol: ? Limit how much you have to 0-1 drink a day. ? Be aware of how much alcohol is in your drink. In the [place] one  drink equals one 12 oz bottle of beer (355 mL), one 5 oz glass of wine (148 mL), or one 1 oz glass of hard liquor (44 mL). Lifestyle  Take daily care of your teeth and gums.  Stay active. Exercise for at least 30 minutes on 5 or more days each week.  Do not use any products that contain nicotine or tobacco, such as cigarettes, e-cigarettes, and chewing tobacco. If you need help quitting, ask your health care provider.  If you are sexually active, practice safe sex. Use a condom or other form of birth control (contraception) in order to prevent pregnancy and STIs (sexually transmitted infections).  If told by your health care provider, take low-dose aspirin daily starting at age 50. What's next?  Visit your health care provider once a year for a well check visit.  Ask your health care provider how often you should have your eyes and teeth checked.  Stay up to date on all vaccines. This information is not intended to replace advice given to you by your health care provider. Make sure   you discuss any questions you have with your health care provider. Document Released: 04/01/2015 Document Revised: 11/14/2017 Document Reviewed: 11/14/2017 Elsevier Patient Education  2020 Elsevier Inc.  

## 2018-11-21 ENCOUNTER — Other Ambulatory Visit: Payer: Self-pay

## 2018-11-21 DIAGNOSIS — E785 Hyperlipidemia, unspecified: Secondary | ICD-10-CM

## 2018-11-21 DIAGNOSIS — R7303 Prediabetes: Secondary | ICD-10-CM

## 2018-11-21 LAB — HEMOGLOBIN A1C
Hgb A1c MFr Bld: 6.3 % — ABNORMAL HIGH (ref 4.8–5.6)
Mean Plasma Glucose: 134.11 mg/dL

## 2018-11-21 LAB — COMPREHENSIVE METABOLIC PANEL
ALT: 15 U/L (ref 0–44)
AST: 18 U/L (ref 15–41)
Albumin: 3.8 g/dL (ref 3.5–5.0)
Alkaline Phosphatase: 66 U/L (ref 38–126)
Anion gap: 9 (ref 5–15)
BUN: 9 mg/dL (ref 6–20)
CO2: 26 mmol/L (ref 22–32)
Calcium: 9.5 mg/dL (ref 8.9–10.3)
Chloride: 103 mmol/L (ref 98–111)
Creatinine, Ser: 0.6 mg/dL (ref 0.44–1.00)
GFR calc Af Amer: >60 mL/min (ref >60)
GFR calc non Af Amer: >60 mL/min (ref >60)
Glucose, Bld: 138 mg/dL — ABNORMAL HIGH (ref 70–99)
Potassium: 3.9 mmol/L (ref 3.5–5.1)
Sodium: 138 mmol/L (ref 135–145)
Total Bilirubin: 1 mg/dL (ref 0.3–1.2)
Total Protein: 7.1 g/dL (ref 6.5–8.1)

## 2018-11-21 LAB — LIPID PANEL
Cholesterol: 248 mg/dL — ABNORMAL HIGH (ref 0–200)
HDL: 48 mg/dL (ref >40)
LDL Cholesterol: 169 mg/dL — ABNORMAL HIGH (ref 0–99)
Total CHOL/HDL Ratio: 5.2 RATIO
Triglycerides: 153 mg/dL — ABNORMAL HIGH (ref <150)
VLDL: 31 mg/dL (ref 0–40)

## 2019-02-12 ENCOUNTER — Telehealth: Payer: No Typology Code available for payment source | Admitting: Physician Assistant

## 2019-02-12 DIAGNOSIS — U071 COVID-19: Secondary | ICD-10-CM

## 2019-02-12 MED ORDER — BENZONATATE 100 MG PO CAPS: 100.0000 mg | ORAL_CAPSULE | Freq: Two times a day (BID) | ORAL | 0 refills | Status: DC | PRN

## 2019-02-12 NOTE — Progress Notes (Signed)
Your test for COVID-19 was positive, meaning that you were infected with the novel coronavirus and could give the germ to others.    You have been enrolled in [place] for COVID-19. Daily you will receive a questionnaire within the [facility] website. Our COVID-19 response team will be monitoring your responses daily.  Please continue isolation at home, for at least 10 days since the start of your symptoms and until you have had 24 hours with no fever (without taking a fever reducer) and with improving of symptoms.  Please continue good preventive care measures, including:  frequent hand-washing, avoid touching your face, cover coughs/sneezes, stay out of crowds and keep a 6 foot distance from others.  Recheck or go to the nearest hospital ED tent for re-assessment if fever/cough/breathlessness return.   COVID-19 is a respiratory illness with symptoms that are similar to the flu. Symptoms are typically mild to moderate, but there have been cases of severe illness and death due to the virus. The following symptoms may appear 2-14 days after exposure: . Fever . Cough . Shortness of breath or difficulty breathing . Chills . Repeated shaking with chills . Muscle pain . Headache . Sore throat . New loss of taste or smell . Fatigue . Congestion or runny nose . Nausea or vomiting . Diarrhea  If you develop fever/cough/breathlessness, please stay home for 10 days with improving symptoms and until you have had 24 hours of no fever (without taking a fever reducer).  Go to the nearest hospital ED for assessment if fever/cough/breathlessness are severe or illness seems like a threat to life.  It is vitally important that if you feel that you have an infection such as this virus or any other virus that you stay home and away from places where you may spread it to others.  You should avoid contact with people age 77 and older.   You should wear a mask or cloth face covering over your nose and  mouth if you must be around other people or animals, including pets (even at home). Try to stay at least 6 feet away from other people. This will protect the people around you.  You can use medication such as A prescription cough medication called Tessalon Perles 100 mg. You may take 1-2 capsules every 8 hours as needed for cough  You may also take acetaminophen (Tylenol) as needed for fever.   Reduce your risk of any infection by using the same precautions used for avoiding the common cold or flu:  Marland Kitchen Wash your hands often with soap and warm water for at least 20 seconds.  If soap and water are not readily available, use an alcohol-based hand sanitizer with at least 60% alcohol.  . If coughing or sneezing, cover your mouth and nose by coughing or sneezing into the elbow areas of your shirt or coat, into a tissue or into your sleeve (not your hands). . Avoid shaking hands with others and consider head nods or verbal greetings only. . Avoid touching your eyes, nose, or mouth with unwashed hands.  . Avoid close contact with people who are sick. . Avoid places or events with large numbers of people in one location, like concerts or sporting events. . Carefully consider travel plans you have or are making. . If you are planning any travel outside or inside the [place], visit the CDC's Travelers' Health webpage for the latest health notices. . If you have some symptoms but not all symptoms, continue to  monitor at home and seek medical attention if your symptoms worsen. . If you are having a medical emergency, call 911.  HOME CARE . Only take medications as instructed by your medical team. . Drink plenty of fluids and get plenty of rest. . A steam or ultrasonic humidifier can help if you have congestion.   GET HELP RIGHT AWAY IF YOU HAVE EMERGENCY WARNING SIGNS** FOR COVID-19. If you or someone is showing any of these signs seek emergency medical care immediately. Call 911 or proceed to your closest  emergency facility if: . You develop worsening high fever. . Trouble breathing . Bluish lips or face . Persistent pain or pressure in the chest . New confusion . Inability to wake or stay awake . You cough up blood. . Your symptoms become more severe  **This list is not all possible symptoms. Contact your medical provider for any symptoms that are sever or concerning to you.   MAKE SURE YOU   Understand these instructions.  Will watch your condition.  Will get help right away if you are not doing well or get worse.  Your e-visit answers were reviewed by a board certified advanced clinical practitioner to complete your personal care plan.  Depending on the condition, your plan could have included both over the counter or prescription medications.  If there is a problem please reply once you have received a response from your provider.  Your safety is important to [place].  If you have drug allergies check your prescription carefully.    You can use MyChart to ask questions about today's visit, request a non-urgent call back, or ask for a work or school excuse for 24 hours related to this e-Visit. If it has been greater than 24 hours you will need to follow up with your provider, or enter a new e-Visit to address those concerns. You will get an e-mail in the next two days asking about your experience.  I hope that your e-visit has been valuable and will speed your recovery. Thank you for using e-visits.    5 minutes spent on this chart

## 2019-02-13 MED FILL — BENZONATATE 100 MG CAPS: 100 | 10 days supply | Qty: 20 | Fill #0

## 2019-02-17 ENCOUNTER — Other Ambulatory Visit: Payer: Self-pay

## 2019-02-17 ENCOUNTER — Telehealth (INDEPENDENT_AMBULATORY_CARE_PROVIDER_SITE_OTHER): Payer: No Typology Code available for payment source | Admitting: Family Medicine

## 2019-02-17 DIAGNOSIS — U071 COVID-19: Secondary | ICD-10-CM

## 2019-02-17 MED ORDER — GUAIFENESIN-CODEINE 100-10 MG/5ML PO SYRP: 5.0000 mL | ORAL_SOLUTION | Freq: Three times a day (TID) | ORAL | 1 refills | Status: DC | PRN

## 2019-02-17 MED ORDER — BENZONATATE 100 MG PO CAPS: 100.0000 mg | ORAL_CAPSULE | Freq: Two times a day (BID) | ORAL | 0 refills | Status: DC | PRN

## 2019-02-17 MED FILL — GUAIATUSSIN AC LIQUID: 100-10 | 8 days supply | Qty: 120 | Fill #0

## 2019-02-17 NOTE — Progress Notes (Signed)
Virtual Visit Note  I connected with patient on 02/17/19 at 526pm by phone and verified that I am speaking with the correct person using two identifiers. Rachel [name] is currently located at home and patient is currently with them during visit. The provider, Rutherford Guys, MD is located in their office at time of visit.  I discussed the limitations, risks, security and privacy concerns of performing an evaluation and management service by telephone and the availability of in person appointments. I also discussed with the patient that there may be a patient responsible charge related to this service. The patient expressed understanding and agreed to proceed.   CC: positive covid  HPI ? On nov 26th she was informed she was covid positive Symptoms started on the 17th, tested neg on the 18th Fevers started on the 21st She is not doing too good Yesterday had fever, today 99.8 Chills resolved, still having body aches Having dry cough and intermittent chest tightness, not getting worse Not feeling SOB today, she did feel SOB several days Diarrhea resolved Keeping up with fluids Has been taking nyquil, tessalon pearls    No Known Allergies  Prior to Admission medications   Medication Sig Start Date End Date Taking? Authorizing Provider  atorvastatin (LIPITOR) 80 MG tablet Take 1 tablet (80 mg total) by mouth daily. 11/20/18   Rutherford Guys, MD  benzonatate (TESSALON) 100 MG capsule Take 1 capsule (100 mg total) by mouth 2 (two) times daily as needed for cough. 02/12/19   Madilyn Hook A, PA-C  glucose blood (FREESTYLE LITE) test strip Use once daily to check blood sugar 12/18/16   Harrison Mons, PA  glucose monitoring kit (FREESTYLE) monitoring kit 1 each by Does not apply route daily. 12/18/16   Harrison Mons, PA  metFORMIN (GLUCOPHAGE) 850 MG tablet Take 1 tablet (850 mg total) by mouth 2 (two) times daily with a meal. 11/04/18   Rutherford Guys, MD  Multiple Vitamin  (MULTIVITAMIN WITH MINERALS) TABS tablet Take 1 tablet by mouth daily.    [provider]  pantoprazole (PROTONIX) 40 MG tablet Take 1 tablet (40 mg total) by mouth 2 (two) times daily. 11/20/18   Rutherford Guys, MD    Past Medical History:  Diagnosis Date  . Atypical chest pain 08/17/2015  . 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 Migraines   . Orthostatic hypotension 08/17/2015  . Prediabetes   . PUD (peptic ulcer disease)     Past Surgical History:  Procedure Laterality Date  . CESAREAN SECTION      Social History   Tobacco Use  . Smoking status: Never Smoker  . Smokeless tobacco: Never Used  Substance Use Topics  . Alcohol use: No    Family History  Problem Relation Age of Onset  . Spina bifida Son   . Hyperlipidemia Daughter   . Hyperlipidemia Mother   . Heart disease Sister   . Hyperlipidemia Sister   . Hyperlipidemia Other   . Breast cancer Paternal Aunt     ROS Per hpi  Objective  Vitals as reported by the patient: per above Gen: AAOx3, NAD Speaking is low voice as to not trigger coughing, otherwise breathing comfortably  ASSESSMENT and PLAN  1. COVID-19 Today has been afebrile, SOB improved. Cough persistent. meds for supportive care. ER precautions reviewed.  Other orders - guaiFENesin-codeine (ROBITUSSIN AC) 100-10 MG/5ML syrup; Take 5 mLs by mouth 3 (three) times daily as needed for cough. - benzonatate (TESSALON) 100 MG capsule; Take  1 capsule (100 mg total) by mouth 2 (two) times daily as needed for cough.  FOLLOW-UP: 3-5 days   The above assessment and management plan was discussed with the patient. The patient verbalized understanding of and has agreed to the management plan. Patient is aware to call the clinic if symptoms persist or worsen. Patient is aware when to return to the clinic for a follow-up visit. Patient educated on when it is appropriate to go to the emergency department.    I provided 12 minutes of non-face-to-face time  during this encounter.  Rutherford Guys, MD Primary Care at [place], [place] [address] Ph.  [phone] Fax [phone]

## 2019-02-17 NOTE — Progress Notes (Signed)
Pt not able to talk due to coughing, pos of covid virus. She wants to talk about the tightness that she is feeling in her chest. Denies any pain at this time. Medication verified

## 2019-02-19 ENCOUNTER — Telehealth: Payer: Self-pay

## 2019-02-19 ENCOUNTER — Telehealth: Payer: Self-pay | Admitting: Family Medicine

## 2019-02-19 NOTE — Telephone Encounter (Signed)
Fmla forms were given to pcp for completion

## 2019-02-19 NOTE — Telephone Encounter (Signed)
FMLA paperwork came via fax 12.02.2020 place ppaerwork to be completed by provider in cma/provider mail box at [employer]

## 2019-02-23 ENCOUNTER — Telehealth (INDEPENDENT_AMBULATORY_CARE_PROVIDER_SITE_OTHER): Payer: No Typology Code available for payment source | Admitting: Family Medicine

## 2019-02-23 ENCOUNTER — Telehealth: Payer: Self-pay

## 2019-02-23 ENCOUNTER — Other Ambulatory Visit: Payer: Self-pay

## 2019-02-23 DIAGNOSIS — U071 COVID-19: Secondary | ICD-10-CM | POA: Diagnosis not present

## 2019-02-23 MED FILL — BENZONATATE 100 MG CAPS: 100 | 10 days supply | Qty: 20 | Fill #0

## 2019-02-23 NOTE — Telephone Encounter (Signed)
fmla forms faxed to [phone] on 02-19-2019

## 2019-02-23 NOTE — Progress Notes (Signed)
Virtual Visit Note  I connected with patient on 02/23/19 at 529pm by phone and verified that I am speaking with the correct person using two identifiers. Rachel [name] is currently located at home and patient is currently with them during visit. The provider, Rutherford Guys, MD is located in their office at time of visit.  I discussed the limitations, risks, security and privacy concerns of performing an evaluation and management service by telephone and the availability of in person appointments. I also discussed with the patient that there may be a patient responsible charge related to this service. The patient expressed understanding and agreed to proceed.   CC: covid positive  HPI  Dx covid positive nov 26th, first fever nov 21st Feeling better Still sleeping on 2 pillows otherwise she has chest tightness and worsening cough Mildly productive cough, usually dry - meds help No SOB and no wheezing Diarrhea returned yesterday, today about 4 times, slowing down, watery, no blood No abd pain but very active No recent abx Good fluid intake Cont to feel feverish, sweats but no frank fever? Last fever 4 days ago Remains in quarantine   No Known Allergies  Prior to Admission medications   Medication Sig Start Date End Date Taking? Authorizing Provider  atorvastatin (LIPITOR) 80 MG tablet Take 1 tablet (80 mg total) by mouth daily. 11/20/18  Yes Rutherford Guys, MD  benzonatate (TESSALON) 100 MG capsule Take 1 capsule (100 mg total) by mouth 2 (two) times daily as needed for cough. 02/17/19  Yes Grant Fontana M, MD  glucose blood (FREESTYLE LITE) test strip Use once daily to check blood sugar 12/18/16  Yes Jeffery, Chelle, PA  glucose monitoring kit (FREESTYLE) monitoring kit 1 each by Does not apply route daily. 12/18/16  Yes Jeffery, Chelle, PA  guaiFENesin-codeine (ROBITUSSIN AC) 100-10 MG/5ML syrup Take 5 mLs by mouth 3 (three) times daily as needed for cough. 02/17/19  Yes Rutherford Guys, MD  metFORMIN (GLUCOPHAGE) 850 MG tablet Take 1 tablet (850 mg total) by mouth 2 (two) times daily with a meal. 11/04/18  Yes Rutherford Guys, MD  Multiple Vitamin (MULTIVITAMIN WITH MINERALS) TABS tablet Take 1 tablet by mouth daily.   Yes [provider]  pantoprazole (PROTONIX) 40 MG tablet Take 1 tablet (40 mg total) by mouth 2 (two) times daily. 11/20/18  Yes Rutherford Guys, MD    Past Medical History:  Diagnosis Date  . Atypical chest pain 08/17/2015  . 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 Migraines   . Orthostatic hypotension 08/17/2015  . Prediabetes   . PUD (peptic ulcer disease)     Past Surgical History:  Procedure Laterality Date  . CESAREAN SECTION      Social History   Tobacco Use  . Smoking status: Never Smoker  . Smokeless tobacco: Never Used  Substance Use Topics  . Alcohol use: No    Family History  Problem Relation Age of Onset  . Spina bifida Son   . Hyperlipidemia Daughter   . Hyperlipidemia Mother   . Heart disease Sister   . Hyperlipidemia Sister   . Hyperlipidemia Other   . Breast cancer Paternal Aunt     ROS Per hpi  Objective  Vitals as reported by the patient: Gen: AAOx3, NAD Resp: coughing thru entire conversation.   ASSESSMENT and PLAN  1. COVID-19 AF. Recent return of GI sx. No red flag signs. Discussed supportive measures and ER precautions. Patient to remain in quarantine.  FOLLOW-UP:  1 week   The above assessment and management plan was discussed with the patient. The patient verbalized understanding of and has agreed to the management plan. Patient is aware to call the clinic if symptoms persist or worsen. Patient is aware when to return to the clinic for a follow-up visit. Patient educated on when it is appropriate to go to the emergency department.    I provided 13 minutes of non-face-to-face time during this encounter.  Rutherford Guys, MD Primary Care at [place], [place] [address]  Ph.  [phone] Fax [phone]

## 2019-02-23 NOTE — Progress Notes (Signed)
Pt says she is feeling a little better but still having cough, chest tightness hen she is lying down and lots of fatigue. She is still taking the tessalon, robitussin with codeine, vit d.c and zinc. She is still keeping herself quarantined

## 2019-02-25 NOTE — Telephone Encounter (Signed)
Pt requesting prescription for blisters that have broken out in her mouth and lips. Please advise

## 2019-02-27 ENCOUNTER — Telehealth (INDEPENDENT_AMBULATORY_CARE_PROVIDER_SITE_OTHER): Payer: No Typology Code available for payment source | Admitting: Family Medicine

## 2019-02-27 ENCOUNTER — Other Ambulatory Visit: Payer: Self-pay

## 2019-02-27 DIAGNOSIS — U071 COVID-19: Secondary | ICD-10-CM

## 2019-02-27 DIAGNOSIS — R058 Other specified cough: Secondary | ICD-10-CM

## 2019-02-27 DIAGNOSIS — R05 Cough: Secondary | ICD-10-CM

## 2019-02-27 MED ORDER — LIDOCAINE VISCOUS HCL 2 % MT SOLN: 1.0000 mL | Freq: Four times a day (QID) | OROMUCOSAL | 0 refills | Status: DC | PRN

## 2019-02-27 MED ORDER — VALACYCLOVIR HCL 500 MG PO TABS: 500.0000 mg | ORAL_TABLET | Freq: Two times a day (BID) | ORAL | 0 refills | Status: DC

## 2019-02-27 NOTE — Telephone Encounter (Signed)
Please advise 

## 2019-02-27 NOTE — Progress Notes (Signed)
Virtual Visit Note  I connected with patient on 02/27/19 at 532pm by phone and verified that I am speaking with the correct person using two identifiers. Rachel [name] is currently located at home and patient is currently with them during visit. The provider, Rutherford Guys, MD is located in their office at time of visit.  I discussed the limitations, risks, security and privacy concerns of performing an evaluation and management service by telephone and the availability of in person appointments. I also discussed with the patient that there may be a patient responsible charge related to this service. The patient expressed understanding and agreed to proceed.   CC: covid positive on nov 25th  HPI ? 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 Authorizing Provider  atorvastatin (LIPITOR) 80 MG tablet Take 1 tablet (80 mg total) by mouth daily. 11/20/18   Rutherford Guys, MD  benzonatate (TESSALON) 100 MG capsule Take 1 capsule (100 mg total) by mouth 2 (two) times daily as needed for cough. 02/17/19   Rutherford Guys, MD  glucose blood (FREESTYLE LITE) test strip Use once daily to check blood sugar 12/18/16   Harrison Mons, PA  glucose monitoring kit (FREESTYLE) monitoring kit 1 each by Does not  apply route daily. 12/18/16   Harrison Mons, PA  guaiFENesin-codeine (ROBITUSSIN AC) 100-10 MG/5ML syrup Take 5 mLs by mouth 3 (three) times daily as needed for cough. 02/17/19   Rutherford Guys, MD  metFORMIN (GLUCOPHAGE) 850 MG tablet Take 1 tablet (850 mg total) by mouth 2 (two) times daily with a meal. 11/04/18   Rutherford Guys, MD  Multiple Vitamin (MULTIVITAMIN WITH MINERALS) TABS tablet Take 1 tablet by mouth daily.    [provider]  pantoprazole (PROTONIX) 40 MG tablet Take 1 tablet (40 mg total) by mouth 2 (two) times daily. 11/20/18   Rutherford Guys, MD  valACYclovir (VALTREX) 500 MG tablet Take 1 tablet (500 mg total) by mouth 2 (two) times daily. 02/27/19   Rutherford Guys, MD    Past Medical History:  Diagnosis Date  . Atypical chest pain 08/17/2015  . 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 Migraines   . Orthostatic hypotension 08/17/2015  . Prediabetes   . PUD (peptic ulcer disease)     Past Surgical History:  Procedure Laterality Date  . CESAREAN SECTION      Social History   Tobacco Use  . Smoking status: Never Smoker  . Smokeless tobacco: Never Used  Substance Use Topics  . Alcohol use: No    Family History  Problem Relation Age of Onset  . Spina bifida Son   . Hyperlipidemia Daughter   . Hyperlipidemia Mother   . Heart disease Sister   . Hyperlipidemia Sister   .  Hyperlipidemia Other   . Breast cancer Paternal Aunt     ROS Per hpi  Objective  Vitals as reported by the patient: per above Gen: AAOx3, NAD,  Speaking in full sentences, breathing comfortably  ASSESSMENT and PLAN  1. COVID-19 virus infection Patient has completed quarantine, afebrile and starting to fell consecutively better. Return to work with frequent breaks first 3 days to build up fatigue and then wo restrictions. Patient to reach out if unable if need to be reconsidered.   2. Post-viral cough syndrome  Other orders - lidocaine (XYLOCAINE) 2 % solution; Use as  directed 1 mL in the mouth or throat every 6 (six) hours as needed for mouth pain.  FOLLOW-UP: prn   The above assessment and management plan was discussed with the patient. The patient verbalized understanding of and has agreed to the management plan. Patient is aware to call the clinic if symptoms persist or worsen. Patient is aware when to return to the clinic for a follow-up visit. Patient educated on when it is appropriate to go to the emergency department.    I provided 14 minutes of non-face-to-face time during this encounter.  Rutherford Guys, MD Primary Care at [facility] [place], [place] [address] Ph.  [phone] Fax [phone]

## 2019-02-27 NOTE — Progress Notes (Signed)
Following up with covid sx. Pt is still coughing ad having fatigue. The diarrhea has calmed but her stomach is very active per patient. She has developed sores in her mouth. She is asking if some type of medication can be sent to the pharmacy for this. She is becoming very anxious and is ready to get back into society and to return to work.

## 2019-03-04 ENCOUNTER — Other Ambulatory Visit: Payer: Self-pay

## 2019-03-04 ENCOUNTER — Encounter: Payer: Self-pay | Admitting: Adult Health Nurse Practitioner

## 2019-03-04 ENCOUNTER — Telehealth: Payer: Self-pay | Admitting: Adult Health Nurse Practitioner

## 2019-03-04 ENCOUNTER — Telehealth (INDEPENDENT_AMBULATORY_CARE_PROVIDER_SITE_OTHER): Payer: No Typology Code available for payment source | Admitting: Adult Health Nurse Practitioner

## 2019-03-04 DIAGNOSIS — U071 COVID-19: Secondary | ICD-10-CM | POA: Insufficient documentation

## 2019-03-04 HISTORY — DX: COVID-19: U07.1

## 2019-03-04 NOTE — Telephone Encounter (Signed)
Please advise on the work note. When do you think she should be able to go back to work?

## 2019-03-04 NOTE — Patient Instructions (Signed)
° ° ° °  If you have lab work done today you will be contacted with your lab results within the next 2 weeks.  If you have not heard from us then please contact us. The fastest way to get your results is to register for My Chart. ° ° °IF you received an x-ray today, you will receive an invoice from [facility]. Please contact [facility] at [phone] with questions or concerns regarding your invoice.  ° °IF you received labwork today, you will receive an invoice from [facility]. Please contact [facility] at [phone] with questions or concerns regarding your invoice.  ° °Our billing staff will not be able to assist you with questions regarding bills from these companies. ° °You will be contacted with the lab results as soon as they are available. The fastest way to get your results is to activate your My Chart account. Instructions are located on the last page of this paperwork. If you have not heard from us regarding the results in 2 weeks, please contact this office. °  ° ° ° °

## 2019-03-04 NOTE — Progress Notes (Signed)
Pos for COVID Still not feeling well. Sx: cough, dizziness,and weakness.    Have not retested recently.

## 2019-03-04 NOTE — Telephone Encounter (Signed)
Pt called to ask if her work note could specify when she can return to work  telemed visit on 03/04/2019   Please advise

## 2019-03-04 NOTE — Progress Notes (Signed)
Telemedicine Encounter- SOAP NOTE Established Patient  This telephone encounter was conducted with the patient's (or proxy's) verbal consent via audio telecommunications: yes/no: Yes Patient was instructed to have this encounter in a suitably private space; and to only have persons present to whom they give permission to participate. In addition, patient identity was confirmed by use of name plus two identifiers (DOB and address).  I discussed the limitations, risks, security and privacy concerns of performing an evaluation and management service by telephone and the availability of in person appointments. I also discussed with the patient that there may be a patient responsible charge related to this service. The patient expressed understanding and agreed to proceed.  I spent a total of TIME; 0 MIN TO 60 MIN: 10 minutes talking with the patient or their proxy.  No chief complaint on file.   Subjective   Rachel [name] is a 43 y.o. established patient. Telephone visit today for Covid-19 sequela  HPI  Patient recently saw Dr. Pamella Pert this week for residual Covid symptoms.  She was diagnosed on November 25th.  Very fatigued and was at work today.  The room started spinning and patient felt dizzy.  She has been tracking Bps well under 970 systolic.  Reports that she is drinking increased amounts of water and hydrating well.    Denies current fever, chills, or night sweats.    Patient Active Problem List   Diagnosis Date Noted  . Telehealth encounter for confirmed COVID-19 03/04/2019  . Anxiety 12/24/2017  . Adult situational stress disorder 06/26/2017  . Chronic migraine 12/28/2016  . Prediabetes 12/12/2016  . BMI 25.0-25.9,adult 11/01/2015  . Insomnia 11/01/2015  . Orthostatic hypotension 08/17/2015  . 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 Atypical chest pain 08/17/2015  . Hyperlipidemia 07/24/2015    Past Medical History:  Diagnosis Date  . Atypical chest pain 08/17/2015  .  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 Migraines   . Orthostatic hypotension 08/17/2015  . Prediabetes   . PUD (peptic ulcer disease)   . Telehealth encounter for confirmed COVID-19 03/04/2019    Current Outpatient Medications  Medication Sig Dispense Refill  . atorvastatin (LIPITOR) 80 MG tablet Take 1 tablet (80 mg total) by mouth daily. 90 tablet 3  . benzonatate (TESSALON) 100 MG capsule Take 1 capsule (100 mg total) by mouth 2 (two) times daily as needed for cough. 20 capsule 0  . glucose blood (FREESTYLE LITE) test strip Use once daily to check blood sugar 100 each 4  . glucose monitoring kit (FREESTYLE) monitoring kit 1 each by Does not apply route daily. 1 each 0  . guaiFENesin-codeine (ROBITUSSIN AC) 100-10 MG/5ML syrup Take 5 mLs by mouth 3 (three) times daily as needed for cough. 120 mL 1  . lidocaine (XYLOCAINE) 2 % solution Use as directed 1 mL in the mouth or throat every 6 (six) hours as needed for mouth pain. 15 mL 0  . metFORMIN (GLUCOPHAGE) 850 MG tablet Take 1 tablet (850 mg total) by mouth 2 (two) times daily with a meal. 180 tablet 3  . Multiple Vitamin (MULTIVITAMIN WITH MINERALS) TABS tablet Take 1 tablet by mouth daily.    . pantoprazole (PROTONIX) 40 MG tablet Take 1 tablet (40 mg total) by mouth 2 (two) times daily. 60 tablet 2   No current facility-administered medications for this visit.    No Known Allergies  Social History   Socioeconomic History  . Marital status: Married    Spouse name: Not on file  .  Number of children: 2  . Years of education: Not on file  . Highest education level: Not on file  Occupational History  . 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 Smoking status: Never Smoker  . Smokeless tobacco: Never Used  Substance and Sexual Activity  . Alcohol use: No  . Drug use: No  . Sexual activity: Yes    Birth control/protection: Condom  Other Topics Concern  . Not on file  Social History Narrative   Lives with her  husband and their 2 children   Her son is wheelchair bound due to spina bifida   Social Determinants of Health   Financial Resource Strain:   . Difficulty of Paying Living Expenses: Not on file  Food Insecurity:   . Worried About Charity fundraiser in the Last Year: Not on file  . Ran Out of Food in the Last Year: Not on file  Transportation Needs:   . Lack of Transportation (Medical): Not on file  . Lack of Transportation (Non-Medical): Not on file  Physical Activity:   . Days of Exercise per Week: Not on file  . Minutes of Exercise per Session: Not on file  Stress:   . Feeling of Stress : Not on file  Social Connections:   . Frequency of Communication with Friends and Family: Not on file  . Frequency of Social Gatherings with Friends and Family: Not on file  . Attends Religious Services: Not on file  . Active Member of Clubs or Organizations: Not on file  . Attends Archivist Meetings: Not on file  . Marital Status: Not on file  Intimate Partner Violence:   . Fear of Current or Ex-Partner: Not on file  . Emotionally Abused: Not on file  . Physically Abused: Not on file  . Sexually Abused: Not on file    Review of Systems  Constitutional: Positive for diaphoresis and malaise/fatigue. Negative for chills and fever.  HENT: Negative for congestion and sore throat.   Eyes: Negative.  Negative for blurred vision and double vision.  Respiratory: Negative for cough and shortness of breath.   Cardiovascular: Negative for chest pain and PND.  Gastrointestinal: Negative for vomiting.  Genitourinary: Negative.   Neurological: Positive for weakness. Negative for headaches.    Objective    General appearance: ill sounding, low voice . Mental Status: normal mood, behavior, speech, dress, motor activity, and thought processes, affect appropriate to mood.   Vitals as reported by the patient: Today's Vitals   03/04/19 1417  Temp: 98.5 F (36.9 C)  TempSrc: Temporal     Diagnoses and all orders for this visit:  Telehealth encounter for confirmed COVID-19  Continue to push fluids.  I have asked her to supplement with an electrolyte solution of gatorade or water. Currently, if Bps stay in 15B and 26O systolic, she would need fluid resusitation especially with hx Type I DM.  I will write a note as requested for work from home even though I have suggested she at least take another week off.  She simply does not have the leave time.  She will send BP values to me over the next 2 days via MyChart and if still low, will send her for fluids.  She is inline with this plan.    I discussed the assessment and treatment plan with the patient. The patient was provided an opportunity to ask questions and all were answered. The patient agreed with the plan  and demonstrated an understanding of the instructions.   The patient was advised to call back or seek an in-person evaluation if the symptoms worsen or if the condition fails to improve as anticipated.  I provided 15 minutes of non-face-to-face time during this encounter.  Glyn Ade, NP  Primary Care at [facility]

## 2019-03-06 NOTE — Telephone Encounter (Signed)
I have called pt back and informed her of the message from the provider. She did state understanding and stated that she showed her letter to her manager about working remotely and she stated that they were in agreement about it.

## 2019-03-06 NOTE — Telephone Encounter (Signed)
I advised her to take another 1-2 weeks off but she insisted she did not have the time.  I would recommend at least another week and my note can be altered to reflect that.  Please communicate with patient.   Judson Roch

## 2019-05-22 ENCOUNTER — Ambulatory Visit (INDEPENDENT_AMBULATORY_CARE_PROVIDER_SITE_OTHER): Payer: No Typology Code available for payment source | Admitting: Family Medicine

## 2019-05-22 ENCOUNTER — Other Ambulatory Visit: Payer: Self-pay

## 2019-05-22 ENCOUNTER — Encounter: Payer: Self-pay | Admitting: Family Medicine

## 2019-05-22 VITALS — BP 113/79 | HR 74 | Temp 98.3°F | Ht 66.0 in | Wt 175.6 lb

## 2019-05-22 DIAGNOSIS — R7303 Prediabetes: Secondary | ICD-10-CM | POA: Diagnosis not present

## 2019-05-22 DIAGNOSIS — N926 Irregular menstruation, unspecified: Secondary | ICD-10-CM | POA: Diagnosis not present

## 2019-05-22 DIAGNOSIS — E7801 Familial hypercholesterolemia: Secondary | ICD-10-CM

## 2019-05-22 LAB — POCT URINE PREGNANCY: Preg Test, Ur: NEGATIVE

## 2019-05-22 MED ORDER — ROSUVASTATIN CALCIUM 10 MG PO TABS: 10.0000 mg | ORAL_TABLET | Freq: Every day | ORAL | 1 refills | Status: DC

## 2019-05-22 NOTE — Patient Instructions (Signed)
° ° ° °  If you have lab work done today you will be contacted with your lab results within the next 2 weeks.  If you have not heard from us then please contact us. The fastest way to get your results is to register for My Chart. ° ° °IF you received an x-ray today, you will receive an invoice from [facility]. Please contact [facility] at [phone] with questions or concerns regarding your invoice.  ° °IF you received labwork today, you will receive an invoice from [facility]. Please contact [facility] at [phone] with questions or concerns regarding your invoice.  ° °Our billing staff will not be able to assist you with questions regarding bills from these companies. ° °You will be contacted with the lab results as soon as they are available. The fastest way to get your results is to activate your My Chart account. Instructions are located on the last page of this paperwork. If you have not heard from us regarding the results in 2 weeks, please contact this office. °  ° ° ° °

## 2019-05-22 NOTE — Progress Notes (Signed)
3/5/20214:08 PM  Rachel [name] 14-Aug-1975, 44 y.o., female [id number]  Chief Complaint  Patient presents with  . Diabetes    dpt wants written order for labs to be drawn at [facility], She is a [employer] employee    HPI:   Patient is a 44 y.o. female with past medical history significant for GAD, prediabetes, familial HLP, who presents today for routine followup  Increased atoravastatin to 73m in sept 2020 - has started having muscle pain, specially when she wakes up  Having irregular periods, heavy with clots every 2 weeks for 8-10 days since covid G2P2, non smoker  Denies any known fibroids LMP Apr 30 2018 She is taking multivitamin with iron  Almost completely back to normal after covid At times has fatigued, and still has no taste or smell But overall sign improved  Lab Results  Component Value Date   HGBA1C 6.3 (H) 11/21/2018   HGBA1C 5.6 01/23/2018   HGBA1C 6.1 (H) 06/26/2017   Lab Results  Component Value Date   LDLCALC 169 (H) 11/21/2018   CREATININE 0.60 11/21/2018   Wt Readings from Last 3 Encounters:  05/22/19 175 lb 9.6 oz (79.7 kg)  11/20/18 185 lb (83.9 kg)  07/15/18 183 lb 3.2 oz (83.1 kg)    [facility] 05/22/2019 02/27/2019 02/23/2019  Decreased Interest 0 0 0  Down, Depressed, Hopeless 0 0 0  PHQ - 2 Score 0 0 0    Fall Risk  05/22/2019 02/27/2019 02/23/2019 02/17/2019 11/20/2018  Falls in the past year? 0 0 0 0 0  Number falls in past yr: 0 0 0 0 0  Injury with Fall? 0 0 0 0 0  Follow up - - - - -     No Known Allergies  Prior to Admission medications   Medication Sig Start Date End Date Taking? Authorizing Provider  atorvastatin (LIPITOR) 80 MG tablet Take 1 tablet (80 mg total) by mouth daily. 11/20/18  Yes SRutherford Guys MD  glucose blood (FREESTYLE LITE) test strip Use once daily to check blood sugar 12/18/16  Yes Jeffery, Chelle, PA  glucose monitoring kit (FREESTYLE) monitoring kit 1 each by Does not apply route daily. 12/18/16  Yes  JHarrison Mons PA  metFORMIN (GLUCOPHAGE) 850 MG tablet Take 1 tablet (850 mg total) by mouth 2 (two) times daily with a meal. 11/04/18  Yes SRutherford Guys MD  Multiple Vitamin (MULTIVITAMIN WITH MINERALS) TABS tablet Take 1 tablet by mouth daily.   Yes [provider]  pantoprazole (PROTONIX) 40 MG tablet Take 1 tablet (40 mg total) by mouth 2 (two) times daily. Patient not taking: Reported on 05/22/2019 11/20/18   SRutherford Guys MD    Past Medical History:  Diagnosis Date  . Atypical chest pain 08/17/2015  . 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 Migraines   . Orthostatic hypotension 08/17/2015  . Prediabetes   . PUD (peptic ulcer disease)   . Telehealth encounter for confirmed COVID-19 03/04/2019    Past Surgical History:  Procedure Laterality Date  . CESAREAN SECTION      Social History   Tobacco Use  . Smoking status: Never Smoker  . Smokeless tobacco: Never Used  Substance Use Topics  . Alcohol use: No    Family History  Problem Relation Age of Onset  . Spina bifida Son   . Hyperlipidemia Daughter   . Hyperlipidemia Mother   . Heart disease Sister   . Hyperlipidemia Sister   . Hyperlipidemia Other   .  Breast cancer Paternal Aunt     Review of Systems  Constitutional: Negative for chills and fever.  Respiratory: Negative for cough and shortness of breath.   Cardiovascular: Negative for chest pain, palpitations and leg swelling.  Gastrointestinal: Negative for abdominal pain, nausea and vomiting.  Neurological: Negative for dizziness.  per hpi   OBJECTIVE:  Today's Vitals   05/22/19 1547  BP: 113/79  Pulse: 74  Temp: 98.3 F (36.8 C)  SpO2: 97%  Weight: 175 lb 9.6 oz (79.7 kg)  Height: 5' 6" (1.676 m)   Body mass index is 28.34 kg/m.   Physical Exam Vitals and nursing note reviewed.  Constitutional:      Appearance: She is well-developed.  HENT:     Head: Normocephalic and atraumatic.     Mouth/Throat:     Pharynx: No  oropharyngeal exudate.  Eyes:     General: No scleral icterus.    Conjunctiva/sclera: Conjunctivae normal.     Pupils: Pupils are equal, round, and reactive to light.  Cardiovascular:     Rate and Rhythm: Normal rate and regular rhythm.     Heart sounds: Normal heart sounds. No murmur. No friction rub. No gallop.   Pulmonary:     Effort: Pulmonary effort is normal.     Breath sounds: Normal breath sounds. No wheezing or rales.  Musculoskeletal:     Cervical back: Neck supple.  Skin:    General: Skin is warm and dry.  Neurological:     Mental Status: She is alert and oriented to person, place, and time.     Results for orders placed or performed in visit on 05/22/19 (from the past 24 hour(s))  POCT urine pregnancy     Status: Normal   Collection Time: 05/22/19  4:41 PM  Result Value Ref Range   Preg Test, Ur Negative Negative    No results found.   ASSESSMENT and PLAN  1. Familial hypercholesterolemia Labs pending, changing to crestor due to myalgias on max dose atorvastatin, consider referral to lipid clinic - Lipid panel; Future - Comprehensive metabolic panel; Future  2. Prediabetes Checking labs today, medications will be adjusted as needed.  - Hemoglobin A1c; Future  3. Irregular periods Labs pending. Discussed treatment options, patient would like to defer at this time with hopes that they are starting to normalize. RTC precautions given - TSH; Future - CBC; Future - POCT urine pregnancy  Other orders - rosuvastatin (CRESTOR) 10 MG tablet; Take 1 tablet (10 mg total) by mouth daily.  Return in about 6 months (around 11/22/2019).    Rutherford Guys, MD Primary Care at [place], [place] [address] Ph.  [phone] Fax [phone]

## 2019-05-25 ENCOUNTER — Other Ambulatory Visit: Payer: Self-pay

## 2019-05-25 DIAGNOSIS — R7303 Prediabetes: Secondary | ICD-10-CM

## 2019-05-25 DIAGNOSIS — E7801 Familial hypercholesterolemia: Secondary | ICD-10-CM

## 2019-05-25 DIAGNOSIS — N926 Irregular menstruation, unspecified: Secondary | ICD-10-CM

## 2019-05-25 LAB — CBC
HCT: 38.4 % (ref 36.0–46.0)
Hemoglobin: 12.7 g/dL (ref 12.0–15.0)
MCH: 27.3 pg (ref 26.0–34.0)
MCHC: 33.1 g/dL (ref 30.0–36.0)
MCV: 82.6 fL (ref 80.0–100.0)
Platelets: 369 10*3/uL (ref 150–400)
RBC: 4.65 MIL/uL (ref 3.87–5.11)
RDW: 13.5 % (ref 11.5–15.5)
WBC: 5.8 10*3/uL (ref 4.0–10.5)
nRBC: 0 % (ref 0.0–0.2)

## 2019-05-25 LAB — COMPREHENSIVE METABOLIC PANEL
ALT: 14 U/L (ref 0–44)
AST: 17 U/L (ref 15–41)
Albumin: 4.3 g/dL (ref 3.5–5.0)
Alkaline Phosphatase: 60 U/L (ref 38–126)
Anion gap: 11 (ref 5–15)
BUN: 7 mg/dL (ref 6–20)
CO2: 25 mmol/L (ref 22–32)
Calcium: 9.3 mg/dL (ref 8.9–10.3)
Chloride: 102 mmol/L (ref 98–111)
Creatinine, Ser: 0.54 mg/dL (ref 0.44–1.00)
GFR calc Af Amer: >60 mL/min (ref >60)
GFR calc non Af Amer: >60 mL/min (ref >60)
Glucose, Bld: 92 mg/dL (ref 70–99)
Potassium: 4 mmol/L (ref 3.5–5.1)
Sodium: 138 mmol/L (ref 135–145)
Total Bilirubin: 1 mg/dL (ref 0.3–1.2)
Total Protein: 7.6 g/dL (ref 6.5–8.1)

## 2019-05-25 LAB — LIPID PANEL
Cholesterol: 206 mg/dL — ABNORMAL HIGH (ref 0–200)
HDL: 49 mg/dL (ref >40)
LDL Cholesterol: 139 mg/dL — ABNORMAL HIGH (ref 0–99)
Total CHOL/HDL Ratio: 4.2 RATIO
Triglycerides: 89 mg/dL (ref <150)
VLDL: 18 mg/dL (ref 0–40)

## 2019-05-25 LAB — HEMOGLOBIN A1C
Hgb A1c MFr Bld: 5.6 % (ref 4.8–5.6)
Mean Plasma Glucose: 114.02 mg/dL

## 2019-05-25 LAB — TSH: TSH: 1.69 u[IU]/mL (ref 0.350–4.500)

## 2019-05-25 NOTE — Addendum Note (Signed)
Addended by: Alycia Patten on: 05/25/2019 03:18 PM   Modules accepted: Orders

## 2019-05-25 NOTE — Addendum Note (Signed)
Addended by: Alycia Patten on: 05/25/2019 03:19 PM   Modules accepted: Orders

## 2019-11-24 ENCOUNTER — Other Ambulatory Visit: Payer: Self-pay | Admitting: Family Medicine

## 2019-11-24 ENCOUNTER — Ambulatory Visit (INDEPENDENT_AMBULATORY_CARE_PROVIDER_SITE_OTHER): Payer: No Typology Code available for payment source | Admitting: Family Medicine

## 2019-11-24 ENCOUNTER — Encounter: Payer: Self-pay | Admitting: Family Medicine

## 2019-11-24 ENCOUNTER — Other Ambulatory Visit: Payer: Self-pay

## 2019-11-24 ENCOUNTER — Telehealth: Payer: Self-pay | Admitting: Family Medicine

## 2019-11-24 VITALS — BP 119/84 | HR 86 | Temp 98.3°F | Ht 66.0 in | Wt 181.0 lb

## 2019-11-24 DIAGNOSIS — E7800 Pure hypercholesterolemia, unspecified: Secondary | ICD-10-CM | POA: Diagnosis not present

## 2019-11-24 DIAGNOSIS — R7303 Prediabetes: Secondary | ICD-10-CM | POA: Diagnosis not present

## 2019-11-24 DIAGNOSIS — R42 Dizziness and giddiness: Secondary | ICD-10-CM | POA: Diagnosis not present

## 2019-11-24 MED ORDER — FLUTICASONE PROPIONATE 50 MCG/ACT NA SUSP: 1.0000 | Freq: Two times a day (BID) | NASAL | 6 refills | Status: DC

## 2019-11-24 MED ORDER — MECLIZINE HCL 25 MG PO TABS: 25.0000 mg | ORAL_TABLET | Freq: Three times a day (TID) | ORAL | 0 refills | Status: DC | PRN

## 2019-11-24 MED ORDER — FREESTYLE SYSTEM KIT: 1.0000 | PACK | Freq: Every day | 0 refills | Status: DC

## 2019-11-24 MED ORDER — FREESTYLE LITE TEST VI STRP: ORAL_STRIP | 4 refills | Status: DC

## 2019-11-24 MED ORDER — ROSUVASTATIN CALCIUM 10 MG PO TABS
10.0000 mg | ORAL_TABLET | Freq: Every day | ORAL | 1 refills | Status: DC
Start: 2019-11-24 — End: 2019-12-25

## 2019-11-24 MED ORDER — LANCETS MISC: 3 refills | Status: DC

## 2019-11-24 MED ORDER — METFORMIN HCL 850 MG PO TABS
850.0000 mg | ORAL_TABLET | Freq: Two times a day (BID) | ORAL | 1 refills | Status: DC
Start: 2019-11-24 — End: 2019-11-24

## 2019-11-24 NOTE — Telephone Encounter (Signed)
What is the name of the medication? Blood Glucose Test Strips and Lancets   Have you contacted your pharmacy to request a refill? Rachel [name] from from her pharmacy called [place] needing this. Pt was seen today.       Which pharmacy would you like this sent to?Pharmacy  [facility] at [facility] [place], [place] - [address]  [address], [place] [address]  Phone:  [phone] Fax:  [phone]       Patient notified that their request is being sent to the clinical staff for review and that they should receive a call once it is complete. If they do not receive a call within 72 hours they can check with their pharmacy or our office.

## 2019-11-24 NOTE — Telephone Encounter (Signed)
Looks like the glucose strips and lancets has been sent into the [facility] on 11/24/19

## 2019-11-24 NOTE — Patient Instructions (Signed)
pcp    If you have lab work done today you will be contacted with your lab results within the next 2 weeks.  If you have not heard from us then please contact us. The fastest way to get your results is to register for My Chart.   IF you received an x-ray today, you will receive an invoice from [facility]. Please contact [facility] at [phone] with questions or concerns regarding your invoice.   IF you received labwork today, you will receive an invoice from [facility]. Please contact [facility] at [phone] with questions or concerns regarding your invoice.   Our billing staff will not be able to assist you with questions regarding bills from these companies.  You will be contacted with the lab results as soon as they are available. The fastest way to get your results is to activate your My Chart account. Instructions are located on the last page of this paperwork. If you have not heard from us regarding the results in 2 weeks, please contact this office.     

## 2019-11-24 NOTE — Progress Notes (Signed)
9/7/20218:25 AM  Rachel [name] [birth year], 44 y.o., female [id number]  Chief Complaint  Patient presents with  . Diabetes  . Hyperlipidemia  . Dizziness    during the past 6 mos, has had vertigo 2x. last time a month ago. Says bp is fine    HPI:   Patient is a 44 y.o. female with past medical history significant for GAD, prediabetes, familial HLP, who presents today for routine followup  Last OV March 2021 - changed to crestor  Overall she is doing well She has been having intermittent vertigo (room spinning) with right ear ringing, no hearing loss, and congestion. Self resolving, worse when she moves head certain way, she did not take anything for this Tolerating crestor well She has not been falling her diet, snacking more She has been doing "little walking here and there" She checks her cbgs intermittent, specially when she ate more than usual, fastings 90-120s She will get flu vaccine at work She has been using naproxen BID during her menses and it has been cutting back on bleeding, cramping continues She would like to [facility]  Depression screen [facility] 2/9 05/22/2019 02/27/2019 02/23/2019  Decreased Interest 0 0 0  Down, Depressed, Hopeless 0 0 0  PHQ - 2 Score 0 0 0    Fall Risk  11/24/2019 05/22/2019 02/27/2019 02/23/2019 02/17/2019  Falls in the past year? 0 0 0 0 0  Number falls in past yr: 0 0 0 0 0  Injury with Fall? 0 0 0 0 0  Follow up - - - - -     No Known Allergies  Prior to Admission medications   Medication Sig Start Date End Date Taking? Authorizing Provider  glucose blood (FREESTYLE LITE) test strip Use once daily to check blood sugar 12/18/16  Yes Jeffery, Chelle, PA  glucose monitoring kit (FREESTYLE) monitoring kit 1 each by Does not apply route daily. 12/18/16  Yes Harrison Mons, PA  metFORMIN (GLUCOPHAGE) 850 MG tablet Take 1 tablet (850 mg total) by mouth 2 (two) times daily with a meal. 11/04/18  Yes Rutherford Guys, MD    Multiple Vitamin (MULTIVITAMIN WITH MINERALS) TABS tablet Take 1 tablet by mouth daily.   Yes [provider]  rosuvastatin (CRESTOR) 10 MG tablet Take 1 tablet (10 mg total) by mouth daily. 05/22/19  Yes Rutherford Guys, MD    Past Medical History:  Diagnosis Date  . Atypical chest pain 08/17/2015  . 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 Migraines   . Orthostatic hypotension 08/17/2015  . Prediabetes   . PUD (peptic ulcer disease)   . Telehealth encounter for confirmed COVID-19 03/04/2019    Past Surgical History:  Procedure Laterality Date  . CESAREAN SECTION      Social History   Tobacco Use  . Smoking status: Never Smoker  . Smokeless tobacco: Never Used  Substance Use Topics  . Alcohol use: No    Family History  Problem Relation Age of Onset  . Spina bifida Son   . Hyperlipidemia Daughter   . Hyperlipidemia Mother   . Heart disease Sister   . Hyperlipidemia Sister   . Hyperlipidemia Other   . Breast cancer Paternal Aunt     Review of Systems  Constitutional: Negative for chills and fever.  Respiratory: Negative for cough and shortness of breath.   Cardiovascular: Negative for chest pain, palpitations and leg swelling.  Gastrointestinal: Negative for abdominal pain, nausea and vomiting.   Per  hpi  OBJECTIVE:  Today's Vitals   11/24/19 0822  BP: 119/84  Pulse: 86  Temp: 98.3 F (36.8 C)  SpO2: 98%  Weight: 181 lb (82.1 kg)  Height: $Remove'5\' 6"'BZFAImL$  (1.676 m)   Body mass index is 25.99 kg/m.   Physical Exam Vitals and nursing note reviewed.  Constitutional:      Appearance: She is well-developed.  HENT:     Head: Normocephalic and atraumatic.     Mouth/Throat:     Pharynx: No oropharyngeal exudate.  Eyes:     General: No scleral icterus.    Extraocular Movements: Extraocular movements intact.     Conjunctiva/sclera: Conjunctivae normal.     Pupils: Pupils are equal, round, and reactive to light.  Cardiovascular:     Rate and Rhythm: Normal  rate and regular rhythm.     Heart sounds: Normal heart sounds. No murmur heard.  No friction rub. No gallop.   Pulmonary:     Effort: Pulmonary effort is normal.     Breath sounds: Normal breath sounds. No wheezing, rhonchi or rales.  Musculoskeletal:     Cervical back: Neck supple.  Skin:    General: Skin is warm and dry.  Neurological:     Mental Status: She is alert and oriented to person, place, and time.     No results found for this or any previous visit (from the past 24 hour(s)).  No results found.   ASSESSMENT and PLAN  1. Prediabetes Labs pending. meds to be adjusted as needed. Cont working on LFM - glucose monitoring kit (FREESTYLE) monitoring kit; 1 each by Does not apply route daily. - Hemoglobin A1c; Future  2. Pure hypercholesterolemia Checking fasting labs, medications will be adjusted as needed.  - Comprehensive metabolic panel; Future - Lipid panel; Future  3. Vertigo BBPV, eustachian tube dysfunction?? Trial of flonase and meclizine. Consider ENT referral. RTC precautions reviewed  Other orders - fluticasone (FLONASE) 50 MCG/ACT nasal spray; Place 1 spray into both nostrils 2 (two) times daily. - meclizine (ANTIVERT) 25 MG tablet; Take 1 tablet (25 mg total) by mouth 3 (three) times daily as needed for dizziness. - metFORMIN (GLUCOPHAGE) 850 MG tablet; Take 1 tablet (850 mg total) by mouth 2 (two) times daily with a meal. - rosuvastatin (CRESTOR) 10 MG tablet; Take 1 tablet (10 mg total) by mouth daily.  Return in about 6 months (around 05/23/2020).    Rutherford Guys, MD Primary Care at [place], [place] [address] Ph.  [phone] Fax [phone]

## 2019-11-26 ENCOUNTER — Other Ambulatory Visit: Payer: Self-pay

## 2019-11-26 DIAGNOSIS — E7800 Pure hypercholesterolemia, unspecified: Secondary | ICD-10-CM

## 2019-11-26 DIAGNOSIS — R7303 Prediabetes: Secondary | ICD-10-CM

## 2019-11-26 LAB — COMPREHENSIVE METABOLIC PANEL
ALT: 14 U/L (ref 0–44)
AST: 16 U/L (ref 15–41)
Albumin: 4 g/dL (ref 3.5–5.0)
Alkaline Phosphatase: 55 U/L (ref 38–126)
Anion gap: 10 (ref 5–15)
BUN: 7 mg/dL (ref 6–20)
CO2: 26 mmol/L (ref 22–32)
Calcium: 9.1 mg/dL (ref 8.9–10.3)
Chloride: 100 mmol/L (ref 98–111)
Creatinine, Ser: 0.48 mg/dL (ref 0.44–1.00)
GFR calc Af Amer: >60 mL/min (ref >60)
GFR calc non Af Amer: >60 mL/min (ref >60)
Glucose, Bld: 111 mg/dL — ABNORMAL HIGH (ref 70–99)
Potassium: 4.1 mmol/L (ref 3.5–5.1)
Sodium: 136 mmol/L (ref 135–145)
Total Bilirubin: 1.1 mg/dL (ref 0.3–1.2)
Total Protein: 7 g/dL (ref 6.5–8.1)

## 2019-11-26 LAB — LIPID PANEL
Cholesterol: 225 mg/dL — ABNORMAL HIGH (ref 0–200)
HDL: 55 mg/dL (ref >40)
LDL Cholesterol: 143 mg/dL — ABNORMAL HIGH (ref 0–99)
Total CHOL/HDL Ratio: 4.1 RATIO
Triglycerides: 134 mg/dL (ref <150)
VLDL: 27 mg/dL (ref 0–40)

## 2019-11-26 LAB — HEMOGLOBIN A1C
Hgb A1c MFr Bld: 6.2 % — ABNORMAL HIGH (ref 4.8–5.6)
Mean Plasma Glucose: 131.24 mg/dL

## 2019-12-25 ENCOUNTER — Encounter: Payer: Self-pay | Admitting: Internal Medicine

## 2019-12-25 ENCOUNTER — Other Ambulatory Visit: Payer: Self-pay | Admitting: Internal Medicine

## 2019-12-25 ENCOUNTER — Ambulatory Visit (INDEPENDENT_AMBULATORY_CARE_PROVIDER_SITE_OTHER): Payer: No Typology Code available for payment source | Admitting: Internal Medicine

## 2019-12-25 ENCOUNTER — Other Ambulatory Visit: Payer: Self-pay

## 2019-12-25 VITALS — BP 96/60 | Ht 66.0 in | Wt 185.0 lb

## 2019-12-25 DIAGNOSIS — E7800 Pure hypercholesterolemia, unspecified: Secondary | ICD-10-CM | POA: Diagnosis not present

## 2019-12-25 DIAGNOSIS — R0600 Dyspnea, unspecified: Secondary | ICD-10-CM | POA: Diagnosis not present

## 2019-12-25 DIAGNOSIS — I951 Orthostatic hypotension: Secondary | ICD-10-CM | POA: Diagnosis not present

## 2019-12-25 MED ORDER — FLUDROCORTISONE ACETATE 0.1 MG PO TABS: 0.1000 mg | ORAL_TABLET | Freq: Every day | ORAL | 6 refills | Status: DC

## 2019-12-25 MED ORDER — ROSUVASTATIN CALCIUM 20 MG PO TABS
20.0000 mg | ORAL_TABLET | Freq: Every day | ORAL | 3 refills | Status: DC
Start: 2019-12-25 — End: 2020-03-29

## 2019-12-25 NOTE — Progress Notes (Signed)
Cardiology Office Note:    Date:  12/25/2019   ID:  Rachel [name], DOB 08-Mar-1976, MRN [id number]  PCP:  Rachel Guys, MD  [place] Cardiologist:  No primary care provider on file.  [facility] Electrophysiologist:  None   Referring MD: Rachel Guys, MD   [id number] hypotension Consulted for the evaluation of low blood pressure at the behest of Rachel [name], Rachel M, MD  Previously saw Dr. Oval [name] 2017  History of Present Illness:    Rachel [name] is a 44 y.o. female with a hx of  orthostatic blood pressure, HLD on rosuvastatin 10 mg,who presents for atypical chest pain  Patient notes that she has dizziness with low blood pressure.  Patient notes that every two weeks.  This has increased in frequency from once a month.  Patient drinks Gatorade and keeps up with her fluid with no dietary changes.  Two weeks ago, patient had very dizzy and near syncopal.  At that time her blood pressure was not palpable.  Florinef taken in the past but has made her feel a bit drowsy.    No chest pain, chest pressure.  Patient denies shortness of breath. Patient works as a Marine scientist (outpatient) , and walk around without discomfort.  Notes weight gain 4 lbs,  Swelling in ankles, with occasional PND.    Ambulatory blood pressure when symptomatic 80/50.  When normal, SBP 100-110.    Past Medical History:  Diagnosis Date  . Atypical chest pain 08/17/2015  . 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 Migraines   . Orthostatic hypotension 08/17/2015  . Prediabetes   . PUD (peptic ulcer disease)   . Telehealth encounter for confirmed COVID-19 03/04/2019    Past Surgical History:  Procedure Laterality Date  . CESAREAN SECTION      Current Medications: Current Meds  Medication Sig  . fluticasone (FLONASE) 50 MCG/ACT nasal spray Place 1 spray into both nostrils 2 (two) times daily.  Marland Kitchen glucose blood (FREESTYLE LITE) test strip Use once daily to check blood sugar R73.03  . glucose monitoring kit  (FREESTYLE) monitoring kit 1 each by Does not apply route daily.  . Lancets MISC Use to check blood sugars daily. Please provide brand covered by insurance Dx code: R73.03  . meclizine (ANTIVERT) 25 MG tablet Take 1 tablet (25 mg total) by mouth 3 (three) times daily as needed for dizziness.  . metFORMIN (GLUCOPHAGE) 850 MG tablet Take 1 tablet (850 mg total) by mouth 2 (two) times daily with a meal.  . Multiple Vitamin (MULTIVITAMIN WITH MINERALS) TABS tablet Take 1 tablet by mouth daily.  . rosuvastatin (CRESTOR) 10 MG tablet Take 1 tablet (10 mg total) by mouth daily.     Allergies:   Patient has no known allergies.   Social History   Socioeconomic History  . Marital status: Married    Spouse name: Not on file  . Number of children: 2  . Years of education: Not on file  . Highest education level: Not on file  Occupational History  . 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 Smoking status: Never Smoker  . Smokeless tobacco: Never Used  Substance and Sexual Activity  . Alcohol use: No  . Drug use: No  . Sexual activity: Yes    Birth control/protection: Condom  Other Topics Concern  . Not on file  Social History Narrative   Lives with her husband and their 2 children   Her son is wheelchair bound due to  spina bifida   Social Determinants of Health   Financial Resource Strain:   . Difficulty of Paying Living Expenses: Not on file  Food Insecurity:   . Worried About Charity fundraiser in the Last Year: Not on file  . Ran Out of Food in the Last Year: Not on file  Transportation Needs:   . Lack of Transportation (Medical): Not on file  . Lack of Transportation (Non-Medical): Not on file  Physical Activity:   . Days of Exercise per Week: Not on file  . Minutes of Exercise per Session: Not on file  Stress:   . Feeling of Stress : Not on file  Social Connections:   . Frequency of Communication with Friends and Family: Not on file  . Frequency of Social  Gatherings with Friends and Family: Not on file  . Attends Religious Services: Not on file  . Active Member of Clubs or Organizations: Not on file  . Attends Archivist Meetings: Not on file  . Marital Status: Not on file     Family History: The patient's family history includes Breast cancer in her paternal aunt; Heart disease in her sister; Hyperlipidemia in her daughter, mother, sister, and another family member; Spina bifida in her son.  Sister has CAD and early CABG.  ROS:   Please see the history of present illness.    All other systems reviewed and are negative.  EKGs/Labs/Other Studies Reviewed:    The following studies were reviewed today:  EKG:  EKG is  ordered today.  The ekg ordered today demonstrates SR 78 no TWI, no q waves  Recent Labs: 05/25/2019: Hemoglobin 12.7; Platelets 369; TSH 1.690 11/26/2019: ALT 14; BUN 7; Creatinine, Ser 0.48; Potassium 4.1; Sodium 136  Recent Lipid Panel    Component Value Date/Time   CHOL 225 (H) 11/26/2019 0850   CHOL 355 (H) 06/26/2017 1108   TRIG 134 11/26/2019 0850   HDL 55 11/26/2019 0850   HDL 59 06/26/2017 1108   CHOLHDL 4.1 11/26/2019 0850   VLDL 27 11/26/2019 0850   LDLCALC 143 (H) 11/26/2019 0850   LDLCALC 279 (H) 06/26/2017 1108   Negative ECG Stress test in 2017.  Physical Exam:    VS:  BP 96/60   Ht $R'5\' 6"'Xb$  (1.676 [name])   Wt 185 lb (83.9 kg)   SpO2 99%   BMI 29.86 kg/[name]     Orthostatic blood pressure 112/79 76 supine 116/82 84 sitting 111/73 87 standing 112/77 91 persistent standing   Wt Readings from Last 3 Encounters:  12/25/19 185 lb (83.9 kg)  11/24/19 181 lb (82.1 kg)  05/22/19 175 lb 9.6 oz (79.7 kg)    GEN:  Well nourished, well developed in no acute distress HEENT: Normal NECK: No JVD; No carotid bruits LYMPHATICS: No lymphadenopathy CARDIAC: RRR, no murmurs, rubs, gallops RESPIRATORY:  Clear to auscultation without rales, wheezing or rhonchi  ABDOMEN: Soft, non-tender,  non-distended MUSCULOSKELETAL:  Non pitting ankle edema; No deformity  SKIN: Warm and dry NEUROLOGIC:  Alert and oriented x 3 PSYCHIATRIC:  Normal affect   ASSESSMENT:    1. Orthostatic hypotension   2. PND (paroxysmal nocturnal dyspnea)   3. Pure hypercholesterolemia    PLAN:    In order of problems listed above:  Orthostatic hypotension: BP improved with increased fluid and salt intake.  Will return florinef 0.1 mg daily  PND and Leg Swelling - discussed compression stockings - will get BNP and Echocardiogram  HLD Hyperlipidemia -LDL goal  less than 100 -increase statin to 20 mg, may need zetia or repatha in the future -Recheck lipid profile, LFTs in 3 months - gave education on dietary changes   3-4 months follow up unless new symptoms or abnormal test results warranting change in plan (return of chest pain)  Would be reasonable for Virtual Follow up Would be reasonable for  APP Follow up  Medication Adjustments/Labs and Tests Ordered: Current medicines are reviewed at length with the patient today.  Concerns regarding medicines are outlined above.  No orders of the defined types were placed in this encounter.  No orders of the defined types were placed in this encounter.   There are no Patient Instructions on file for this visit.   Signed, Werner Lean, MD  12/25/2019 8:57 AM    [place]

## 2019-12-25 NOTE — Patient Instructions (Addendum)
Medication Instructions:  Your physician has recommended you make the following change in your medication:   1) Start Florinef 0.1 mg, 1 tablet by mouth as needed 2) Increase Rosuvastatin to 20 mg, 1 tablet by mouth once a day  *If you need a refill on your cardiac medications before your next appointment, please call your pharmacy*  Lab Work: You will have labs drawn today: BNP  Testing/Procedures: Your physician has requested that you have an echocardiogram. Echocardiography is a painless test that uses sound waves to create images of your heart. It provides your doctor with information about the size and shape of your heart and how well your heart's chambers and valves are working. This procedure takes approximately one hour. There are no restrictions for this procedure.  Follow-Up: At [facility], you and your health needs are our priority.  As part of our continuing mission to provide you with exceptional heart care, we have created designated Provider Care Teams.  These Care Teams include your primary Cardiologist (physician) and Advanced Practice Providers (APPs -  Physician Assistants and Nurse Practitioners) who all work together to provide you with the care you need, when you need it.  Your next appointment:   3 month(s)   The format for your next appointment:   In Person  Provider:   Riley Lam, MD

## 2019-12-31 LAB — BRAIN NATRIURETIC PEPTIDE: B Natriuretic Peptide: 30.6 pg/mL (ref 0.0–100.0)

## 2020-01-15 ENCOUNTER — Other Ambulatory Visit: Payer: Self-pay

## 2020-01-15 ENCOUNTER — Ambulatory Visit (HOSPITAL_COMMUNITY): Payer: No Typology Code available for payment source | Attending: Cardiology

## 2020-01-15 DIAGNOSIS — I951 Orthostatic hypotension: Secondary | ICD-10-CM | POA: Diagnosis present

## 2020-01-15 DIAGNOSIS — R0600 Dyspnea, unspecified: Secondary | ICD-10-CM | POA: Insufficient documentation

## 2020-01-15 LAB — ECHOCARDIOGRAM COMPLETE
Area-P 1/2: 4.89 cm2
S' Lateral: 2.7 cm

## 2020-02-08 ENCOUNTER — Other Ambulatory Visit: Payer: Self-pay | Admitting: Family Medicine

## 2020-02-08 MED ORDER — FLUCONAZOLE 150 MG PO TABS: 150.0000 mg | ORAL_TABLET | Freq: Once | ORAL | 0 refills | Status: DC

## 2020-02-08 NOTE — Progress Notes (Signed)
Pt reports sxs consistent w/ yeast infection.  Diflucan sent to pharmacy.

## 2020-03-16 ENCOUNTER — Other Ambulatory Visit: Payer: Self-pay | Admitting: Physician Assistant

## 2020-03-16 DIAGNOSIS — Z20822 Contact with and (suspected) exposure to covid-19: Secondary | ICD-10-CM

## 2020-03-18 LAB — NOVEL CORONAVIRUS, NAA: SARS-CoV-2, NAA: NOT DETECTED

## 2020-03-18 LAB — SARS-COV-2, NAA 2 DAY TAT

## 2020-03-21 ENCOUNTER — Encounter: Payer: Self-pay | Admitting: Family Medicine

## 2020-03-21 ENCOUNTER — Ambulatory Visit (INDEPENDENT_AMBULATORY_CARE_PROVIDER_SITE_OTHER): Payer: No Typology Code available for payment source | Admitting: Family Medicine

## 2020-03-21 ENCOUNTER — Other Ambulatory Visit: Payer: Self-pay

## 2020-03-21 VITALS — BP 120/82 | HR 88 | Temp 97.9°F | Ht 66.0 in | Wt 182.0 lb

## 2020-03-21 DIAGNOSIS — E7849 Other hyperlipidemia: Secondary | ICD-10-CM | POA: Diagnosis not present

## 2020-03-21 DIAGNOSIS — R7303 Prediabetes: Secondary | ICD-10-CM | POA: Diagnosis not present

## 2020-03-21 DIAGNOSIS — N920 Excessive and frequent menstruation with regular cycle: Secondary | ICD-10-CM

## 2020-03-21 DIAGNOSIS — I951 Orthostatic hypotension: Secondary | ICD-10-CM | POA: Diagnosis not present

## 2020-03-21 DIAGNOSIS — F4320 Adjustment disorder, unspecified: Secondary | ICD-10-CM

## 2020-03-21 DIAGNOSIS — F5101 Primary insomnia: Secondary | ICD-10-CM

## 2020-03-21 DIAGNOSIS — Z1159 Encounter for screening for other viral diseases: Secondary | ICD-10-CM

## 2020-03-21 LAB — COMPREHENSIVE METABOLIC PANEL
ALT: 9 U/L (ref 0–35)
AST: 13 U/L (ref 0–37)
Albumin: 4.6 g/dL (ref 3.5–5.2)
Alkaline Phosphatase: 51 U/L (ref 39–117)
BUN: 9 mg/dL (ref 6–23)
CO2: 26 mEq/L (ref 19–32)
Calcium: 9 mg/dL (ref 8.4–10.5)
Chloride: 102 mEq/L (ref 96–112)
Creatinine, Ser: 0.5 mg/dL (ref 0.40–1.20)
GFR: 114.7 mL/min (ref >60.00)
Glucose, Bld: 117 mg/dL — ABNORMAL HIGH (ref 70–99)
Potassium: 3.8 mEq/L (ref 3.5–5.1)
Sodium: 137 mEq/L (ref 135–145)
Total Bilirubin: 0.9 mg/dL (ref 0.2–1.2)
Total Protein: 7.6 g/dL (ref 6.0–8.3)

## 2020-03-21 LAB — CBC WITH DIFFERENTIAL/PLATELET
Basophils Absolute: 0 10*3/uL (ref 0.0–0.1)
Basophils Relative: 0.3 % (ref 0.0–3.0)
Eosinophils Absolute: 0.1 10*3/uL (ref 0.0–0.7)
Eosinophils Relative: 1.6 % (ref 0.0–5.0)
HCT: 37.8 % (ref 36.0–46.0)
Hemoglobin: 13 g/dL (ref 12.0–15.0)
Lymphocytes Relative: 43.3 % (ref 12.0–46.0)
Lymphs Abs: 2.1 10*3/uL (ref 0.7–4.0)
MCHC: 34.3 g/dL (ref 30.0–36.0)
MCV: 81.7 fl (ref 78.0–100.0)
Monocytes Absolute: 0.3 10*3/uL (ref 0.1–1.0)
Monocytes Relative: 6.2 % (ref 3.0–12.0)
Neutro Abs: 2.4 10*3/uL (ref 1.4–7.7)
Neutrophils Relative %: 48.6 % (ref 43.0–77.0)
Platelets: 354 10*3/uL (ref 150.0–400.0)
RBC: 4.62 Mil/uL (ref 3.87–5.11)
RDW: 13.3 % (ref 11.5–15.5)
WBC: 4.9 10*3/uL (ref 4.0–10.5)

## 2020-03-21 LAB — HEMOGLOBIN A1C: Hgb A1c MFr Bld: 6.2 % (ref 4.6–6.5)

## 2020-03-21 LAB — LIPID PANEL
Cholesterol: 249 mg/dL — ABNORMAL HIGH (ref 0–200)
HDL: 47.5 mg/dL (ref >39.00)
LDL Cholesterol: 164 mg/dL — ABNORMAL HIGH (ref 0–99)
NonHDL: 201.06
Total CHOL/HDL Ratio: 5
Triglycerides: 184 mg/dL — ABNORMAL HIGH (ref 0.0–149.0)
VLDL: 36.8 mg/dL (ref 0.0–40.0)

## 2020-03-21 NOTE — Progress Notes (Signed)
Subjective  CC:  Chief Complaint  Patient presents with  . New Patient (Initial Visit)    HPI: Rachel [name] is a 45 y.o. female who presents to [facility] [place] today to establish care with me as a new patient.  Wonderful 45 year old married female here to establish care.  She has our clinical Conservation officer, historic buildings.  She is married and has 2 children.  Her daughter is 55.  Her son suffers from spina bifida and severe neurologic dysfunction.  He is 19, bedbound, chronic tracheostomy and J-tube 24-hour care needed.  We reviewed her past medical history in detail.  Chart was updated.  Old records reviewed.  Last physical was March of last year.  I reviewed recent lab work. She has the following concerns or needs:  Familial hyperlipidemia on Crestor.  This dose was recently increased.  Her LDL goal is likely less than 70 if we decide she is a diabetic.  She is due follow-up fasting lipid today.  She is tolerating the Crestor well.  Her diet is mostly low-cholesterol although she does eat bread daily.  Minimal exercise.  Orthostatic hypotension: On Florinef intermittently.  Work-up to date has been negative.  Echocardiogram pending.  Prediabetes on Metformin x3 years.  Most recent A1c was 6.2 on the medication.  She tries to follow a diabetic diet.  Her consumption of bread is likely a little too high but otherwise she eats healthy.  No symptoms of hyperglycemia.  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  She is in a happy marriage.  Has no symptoms of depression.  She prefers not to use medication.  Primary insomnia: She describes herself as a light sleeper.  At times she is up throughout the night caring for her son which disturbs her routine.  She has used medications in the past with prefers not to use them in part because they make her drowsy in the morning.  Heavy menstrual cycles worse on the last year  or 2.  No history of anemia.  But does feel tired at times.  She reports she has had a pelvic ultrasound showed ovarian cyst but no fibroids.  She uses condoms for birth control.  She does not feel like she needs help with medications to regulate her cycles at this point.  Health maintenance is current and up-to-date  Assessment  1. Familial hyperlipidemia   2. Orthostatic hypotension   3. Prediabetes   4. Adult situational stress disorder   5. Primary insomnia   6. Menorrhagia with regular cycle   7. Need for hepatitis C screening test      Plan   Familial hyperlipidemia: We will recheck statins today.  Likely will need a higher dose of Crestor to get to goal.  Education given.  Nutritional counseling discussed.  Orthostatic hypotension: She continues with good hydration.  Using Florinef about once every other week.  Stable for now.  Prediabetes: On Metformin.  Check A1c today.  Discussed possibly stopping if we feel she only has prediabetes or stopping to check if she has crossover to diabetic diagnosis.  Continue diabetic diet.  Discussed weight and exercise.  Barriers to weight loss are her full-time job and full-time care needs of her son.  Immunizations are up-to-date however if she does have diabetes we should give her a Pneumovax.  Stress and insomnia: Chronic and stable.  Monitor.  Menorrhagia: Check for anemia.  Consider iron replacement.  Patient defers hormonal management at this time.  Follow up: Complete physical in 3 to 6 months Orders Placed This Encounter  Procedures  . CBC with Differential/Platelet  . Comprehensive metabolic panel  . Lipid panel  . Hepatitis C antibody  . Hemoglobin A1c   No orders of the defined types were placed in this encounter.    Depression screen [facility] 2/9 05/22/2019 02/27/2019 02/23/2019 02/17/2019 11/20/2018  Decreased Interest 0 0 0 0 0  Down, Depressed, Hopeless 0 0 0 0 0  PHQ - 2 Score 0 0 0 0 0    We updated and reviewed the  patient's past history in detail and it is documented below.  Patient Active Problem List   Diagnosis Date Noted  . Adult situational stress disorder 06/26/2017  . Chronic migraine 12/28/2016    Referred to neurology. Did not tolerate topiramate.   . Prediabetes 12/12/2016  . Insomnia 11/01/2015  . Orthostatic hypotension 08/17/2015  . Familial hyperlipidemia 08/17/2015   Health Maintenance  Topic Date Due  . Hepatitis C Screening  11/23/2020 (Originally 1975-07-13)  . PAP SMEAR-Modifier  10/31/2020  . TETANUS/TDAP  01/18/2024  . INFLUENZA VACCINE  Completed  . COVID-19 Vaccine  Completed  . HIV Screening  Completed   Immunization History  Administered Date(s) Administered  . Influenza,inj,Quad PF,6+ Mos 12/27/2019  . Influenza-Unspecified 12/13/2015, 12/06/2017, 12/09/2018  . PFIZER SARS-COV-2 Vaccination 08/24/2019, 09/12/2019   Current Meds  Medication Sig  . fludrocortisone (FLORINEF) 0.1 MG tablet Take 1 tablet (0.1 mg total) by mouth daily.  Marland Kitchen glucose blood (FREESTYLE LITE) test strip Use once daily to check blood sugar R73.03  . glucose monitoring kit (FREESTYLE) monitoring kit 1 each by Does not apply route daily.  . Lancets MISC Use to check blood sugars daily. Please provide brand covered by insurance Dx code: R73.03  . meclizine (ANTIVERT) 25 MG tablet Take 1 tablet (25 mg total) by mouth 3 (three) times daily as needed for dizziness.  . metFORMIN (GLUCOPHAGE) 850 MG tablet Take 1 tablet (850 mg total) by mouth 2 (two) times daily with a meal.  . Multiple Vitamin (MULTIVITAMIN WITH MINERALS) TABS tablet Take 1 tablet by mouth daily.  . rosuvastatin (CRESTOR) 20 MG tablet Take 1 tablet (20 mg total) by mouth daily.    Allergies: Patient has No Known Allergies. Past Medical History Patient  has a past medical history of Anxiety, Familial hyperlipidemia (08/17/2015), Migraines, Orthostatic hypotension (08/17/2015), Prediabetes, and PUD (peptic ulcer disease). Past  Surgical History Patient  has a past surgical history that includes Cesarean section. Family History: Patient family history includes Breast cancer in her paternal aunt; Heart disease in her sister; Hyperlipidemia in her daughter, mother, sister, and another family member; Spina bifida in her son. Social History:  Patient  reports that she has never smoked. She has never used smokeless tobacco. She reports that she does not drink alcohol and does not use drugs.  Review of Systems: Constitutional: negative for fever or malaise Ophthalmic: negative for photophobia, double vision or loss of vision Cardiovascular: negative for chest pain, dyspnea on exertion, or new LE swelling Respiratory: negative for SOB or persistent cough Gastrointestinal: negative for abdominal pain, change in bowel habits or melena Genitourinary: negative for dysuria or gross hematuria Musculoskeletal: negative for new gait disturbance or muscular weakness Integumentary: negative for new or persistent rashes Neurological: negative for TIA or stroke symptoms Psychiatric: negative for SI or delusions Allergic/Immunologic: negative for hives  Patient Care Team  Relationship Specialty Notifications Start End  Leamon Arnt, MD PCP - General Family Medicine  03/21/20     Objective  Vitals: BP 120/82   Pulse 88   Temp 97.9 F (36.6 C) (Temporal)   Ht $R'5\' 6"'oT$  (1.676 m)   Wt 182 lb (82.6 kg)   SpO2 98%   BMI 29.38 kg/m  General:  Well developed, well nourished, no acute distress  Psych:  Alert and oriented,normal mood and affect HEENT:  Normocephalic, atraumatic, non-icteric sclera, supple neck without adenopathy, mass or thyromegaly Cardiovascular:  RRR without gallop, rub or murmur Respiratory:  Good breath sounds bilaterally, CTAB with normal respiratory effort Gastrointestinal: normal bowel sounds, soft, non-tender, no noted masses. No HSM Extremities: No pitting edema Neurologic:    Mental status is normal.  Gross motor and sensory exams are normal. Normal gait   Commons side effects, risks, benefits, and alternatives for medications and treatment plan prescribed today were discussed, and the patient expressed understanding of the given instructions. Patient is instructed to call or message via MyChart if he/she has any questions or concerns regarding our treatment plan. No barriers to understanding were identified. We discussed Red Flag symptoms and signs in detail. Patient expressed understanding regarding what to do in case of urgent or emergency type symptoms.   Medication list was reconciled, printed and provided to the patient in AVS. Patient instructions and summary information was reviewed with the patient as documented in the AVS. This note was prepared with assistance of Dragon voice recognition software. Occasional wrong-word or sound-a-like substitutions may have occurred due to the inherent limitations of voice recognition software  This visit occurred during the SARS-CoV-2 public health emergency.  Safety protocols were in place, including screening questions prior to the visit, additional usage of staff PPE, and extensive cleaning of exam room while observing appropriate contact time as indicated for disinfecting solutions.

## 2020-03-21 NOTE — Patient Instructions (Signed)
Please return in 3-6 months for your annual complete physical; please come fasting.  I will release your lab results to you on your MyChart account with further instructions. Please reply with any questions.   It was a pleasure seeing you today! Thank you for choosing [place] to meet your healthcare needs! I truly look forward to working with you. If you have any questions or concerns, please send me a message via Mychart or call the office at [phone].  We will think about your metformin and your diagnosis of prediabetes.   Take care of yourself! Do what makes you happy :)

## 2020-03-22 LAB — HEPATITIS C ANTIBODY
Hepatitis C Ab: NONREACTIVE
SIGNAL TO CUT-OFF: 0.01 (ref <1.00)

## 2020-03-29 ENCOUNTER — Ambulatory Visit: Payer: No Typology Code available for payment source | Admitting: Internal Medicine

## 2020-03-29 ENCOUNTER — Encounter: Payer: Self-pay | Admitting: Internal Medicine

## 2020-03-29 ENCOUNTER — Other Ambulatory Visit: Payer: Self-pay

## 2020-03-29 ENCOUNTER — Other Ambulatory Visit: Payer: Self-pay | Admitting: Internal Medicine

## 2020-03-29 VITALS — BP 104/72 | HR 75 | Ht 66.0 in | Wt 183.3 lb

## 2020-03-29 DIAGNOSIS — I951 Orthostatic hypotension: Secondary | ICD-10-CM

## 2020-03-29 DIAGNOSIS — E7849 Other hyperlipidemia: Secondary | ICD-10-CM | POA: Diagnosis not present

## 2020-03-29 DIAGNOSIS — E1169 Type 2 diabetes mellitus with other specified complication: Secondary | ICD-10-CM | POA: Diagnosis not present

## 2020-03-29 DIAGNOSIS — E785 Hyperlipidemia, unspecified: Secondary | ICD-10-CM | POA: Diagnosis not present

## 2020-03-29 MED ORDER — ROSUVASTATIN CALCIUM 40 MG PO TABS: 40.0000 mg | ORAL_TABLET | Freq: Every day | ORAL | 3 refills | Status: DC

## 2020-03-29 NOTE — Progress Notes (Signed)
Cardiology Office Note:    Date:  03/29/2020   ID:  [name], DOB May 10, 1975, MRN [id number]  PCP:  Leamon Arnt, MD  [place] Cardiologist:  No primary care provider on file.  [facility] Electrophysiologist:  None   CC:  Follow up Orthostatic Hypotension  History of Present Illness:    Rachel [name] is a 45 y.o. female with a hx of  orthostatic blood pressure, DM HLD on rosuvastatin 10 mg, who presented for evaluation 12/25/19.  In interval normal BNP and Echo.  Sparring use of Florinef.  Patient notes that she is doing fine.  Since day prior/last visit notes no issues at work changes.  Relevant interval testing or therapy include two episodes of needing florinef because of dizziness after prolonged supine position.  Has new diagnosis of diabetes.There are no interval hospital/ED visit.    No chest pain or pressure.  No SOB/DOE and no PND/Orthopnea.  No weight gain but leg swelling after work.  No palpitations or syncope.  Ambulatory blood pressure not done- when symptomatic 90/60 .  Past Medical History:  Diagnosis Date  . Anxiety   . 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 Migraines   . Orthostatic hypotension 08/17/2015  . Prediabetes   . PUD (peptic ulcer disease)     Past Surgical History:  Procedure Laterality Date  . CESAREAN SECTION      Current Medications: Current Meds  Medication Sig  . fludrocortisone (FLORINEF) 0.1 MG tablet Take 1 tablet (0.1 mg total) by mouth daily.  . fluticasone (FLONASE) 50 MCG/ACT nasal spray Place 1 spray into both nostrils 2 (two) times daily.  Marland Kitchen glucose blood (FREESTYLE LITE) test strip Use once daily to check blood sugar R73.03  . glucose monitoring kit (FREESTYLE) monitoring kit 1 each by Does not apply route daily.  . Lancets MISC Use to check blood sugars daily. Please provide brand covered by insurance Dx code: R73.03  . meclizine (ANTIVERT) 25 MG tablet Take 1 tablet (25 mg total) by mouth 3 (three) times daily as  needed for dizziness.  . metFORMIN (GLUCOPHAGE) 850 MG tablet Take 1 tablet (850 mg total) by mouth 2 (two) times daily with a meal.  . Multiple Vitamin (MULTIVITAMIN WITH MINERALS) TABS tablet Take 1 tablet by mouth daily.  . [DISCONTINUED] rosuvastatin (CRESTOR) 20 MG tablet Take 1 tablet (20 mg total) by mouth daily.     Allergies:   Patient has no known allergies.   Social History   Socioeconomic History  . Marital status: Married    Spouse name: Not on file  . Number of children: 2  . Years of education: Not on file  . Highest education level: Not on file  Occupational History  . Occupation: Buyer, retail    Employer: [employer]  Tobacco Use  . Smoking status: Never Smoker  . Smokeless tobacco: Never Used  Substance and Sexual Activity  . Alcohol use: No  . Drug use: No  . Sexual activity: Yes    Birth control/protection: Condom  Other Topics Concern  . Not on file  Social History Narrative   Lives with her husband and their 2 children   Her son is wheelchair bound due to spina bifida   Social Determinants of Health   Financial Resource Strain: Not on file  Food Insecurity: Not on file  Transportation Needs: Not on file  Physical Activity: Not on file  Stress: Not on file  Social Connections: Not on file  Family History: The patient's family history includes Breast cancer in her paternal aunt; Heart disease in her sister; Hyperlipidemia in her daughter, mother, sister, and another family member; Spina bifida in her son. There is no history of Cancer.  History of coronary artery disease for no members History of heart failure notable for no members, sister has valve replacement. History of arrhythmia notable for no members.  ROS:   Please see the history of present illness.    All other systems reviewed and are negative.  EKGs/Labs/Other Studies Reviewed:    The following studies were reviewed today:  EKG:  12/25/19: SR 78 no TWI, no q  waves  Recent Labs: 05/25/2019: TSH 1.690 12/25/2019: B Natriuretic Peptide 30.6 03/21/2020: ALT 9; BUN 9; Creatinine, Ser 0.50; Hemoglobin 13.0; Platelets 354.0; Potassium 3.8; Sodium 137  Recent Lipid Panel    Component Value Date/Time   CHOL 249 (H) 03/21/2020 0904   CHOL 355 (H) 06/26/2017 1108   TRIG 184.0 (H) 03/21/2020 0904   HDL 47.50 03/21/2020 0904   HDL 59 06/26/2017 1108   CHOLHDL 5 03/21/2020 0904   VLDL 36.8 03/21/2020 0904   LDLCALC 164 (H) 03/21/2020 0904   LDLCALC 279 (H) 06/26/2017 1108    Transthoracic Echocardiogram: Date: 01/15/20 Results: Normal Biventricular Function IMPRESSIONS  1. Left ventricular ejection fraction, by estimation, is 60 to 65%. The  left ventricle has normal function. The left ventricle has no regional  wall motion abnormalities. Left ventricular diastolic parameters were  normal.  2. Right ventricular systolic function is normal. The right ventricular  size is normal.  3. The mitral valve is normal in structure. Trivial mitral valve  regurgitation. No evidence of mitral stenosis.  4. The aortic valve is tricuspid. Aortic valve regurgitation is not  visualized. Mild aortic valve sclerosis is present, with no evidence of  aortic valve stenosis.  5. The inferior vena cava is normal in size with greater than 50%  respiratory variability, suggesting right atrial pressure of 3 mmHg.   ECG Stress Testing: Date: 2017 Results:Negative ECG Stress test in 2017.  Physical Exam:    VS:  BP 104/72   Pulse 75   Ht _0  (1.676 m)   Wt 183 lb 4.8 oz (83.1 kg)   SpO2 97%   BMI 29.59 kg/m     Orthostatic blood pressure  104/73 72 supine 104/72 75 sitting 112/71 81 standing 112/74 84 persistent standing   Wt Readings from Last 3 Encounters:  03/29/20 183 lb 4.8 oz (83.1 kg)  03/21/20 182 lb (82.6 kg)  12/25/19 185 lb (83.9 kg)    GEN:  Well nourished, well developed in no acute distress HEENT: Normal NECK: No JVD; No carotid  bruits LYMPHATICS: No lymphadenopathy CARDIAC: RRR, no murmurs, rubs, gallops RESPIRATORY:  Clear to auscultation without rales, wheezing or rhonchi  ABDOMEN: Soft, non-tender, non-distended MUSCULOSKELETAL:  No edema; notable Achilles's tendon thickening without Xanthoma SKIN: Warm and dry NEUROLOGIC:  Alert and oriented x 3 PSYCHIATRIC:  Normal affect   ASSESSMENT:    1. Hyperlipidemia associated with type 2 diabetes mellitus ([place])   2. Orthostatic hypotension   3. Familial hyperlipidemia    PLAN:    In order of problems listed above:  Orthostatic Hypotension - asymptomatic - stress the importance of salt and water intake - gave education on slow rise, Valsalva maneuver exacerbation, temperature change - discussed muscle contraction and leg crossing - discussed elevation to 30-45 degrees when sleeping - discussed  exercise deconditioning exacerbates this  issue, optimally benefits from bike/row or swimming - offered compression stockings and abdominal binders - heating abdominal pad for supine hypertension - has Florinef 0.1 mg  Hyperlipidemia (famililal) with DM -LDL goal less than 100 -increase statin to 40 mg and check lipids LFTs in 3 months - discussed with [id number] - offered CAC scan; will defer at this time - lipid clinic referral - gave education on dietary changes   5-6 months follow up unless new symptoms or abnormal test results warranting change in plan   Would be reasonable for Virtual Follow up Would be reasonable for  APP Follow up  Medication Adjustments/Labs and Tests Ordered: Current medicines are reviewed at length with the patient today.  Concerns regarding medicines are outlined above.  Orders Placed This Encounter  Procedures  . Hepatic function panel  . Lipid panel  . AMB Referral to [facility]   Meds ordered this encounter  Medications  . rosuvastatin (CRESTOR) 40 MG tablet    Sig: Take 1 tablet (40 mg total) by mouth daily.     Dispense:  90 tablet    Refill:  3    Patient Instructions  Medication Instructions:  1) INCREASE CRESTOR (rosuvastatin) to 40 mg daily *If you need a refill on your cardiac medications before your next appointment, please call your pharmacy*  Lab Work: Your provider recommends that you return for FASTING lab work in 3 months. If you have labs (blood work) drawn today and your tests are completely normal, you will receive your results only by: Marland Kitchen MyChart Message (if you have MyChart) OR . A paper copy in the mail If you have any lab test that is abnormal or we need to change your treatment, we will call you to review the results.  Follow-Up: You have been referred to LIPID CLINIC.  At [facility], you and your health needs are our priority.  As part of our continuing mission to provide you with exceptional heart care, we have created designated Provider Care Teams.  These Care Teams include your primary Cardiologist (physician) and Advanced Practice Providers (APPs -  Physician Assistants and Nurse Practitioners) who all work together to provide you with the care you need, when you need it. Your next appointment:   5 month(s) The format for your next appointment:   In Person Provider:   Rudean Haskell, MD     Signed, Werner Lean, MD  03/29/2020 9:24 AM    [place]

## 2020-03-29 NOTE — Patient Instructions (Signed)
Medication Instructions:  1) INCREASE CRESTOR (rosuvastatin) to 40 mg daily *If you need a refill on your cardiac medications before your next appointment, please call your pharmacy*  Lab Work: Your provider recommends that you return for FASTING lab work in 3 months. If you have labs (blood work) drawn today and your tests are completely normal, you will receive your results only by: Marland Kitchen MyChart Message (if you have MyChart) OR . A paper copy in the mail If you have any lab test that is abnormal or we need to change your treatment, we will call you to review the results.  Follow-Up: You have been referred to LIPID CLINIC.  At [facility], you and your health needs are our priority.  As part of our continuing mission to provide you with exceptional heart care, we have created designated Provider Care Teams.  These Care Teams include your primary Cardiologist (physician) and Advanced Practice Providers (APPs -  Physician Assistants and Nurse Practitioners) who all work together to provide you with the care you need, when you need it. Your next appointment:   5 month(s) The format for your next appointment:   In Person Provider:   Riley Lam, MD

## 2020-04-06 ENCOUNTER — Ambulatory Visit: Payer: No Typology Code available for payment source

## 2020-04-14 ENCOUNTER — Ambulatory Visit: Payer: No Typology Code available for payment source

## 2020-05-02 ENCOUNTER — Other Ambulatory Visit: Payer: Self-pay | Admitting: Family Medicine

## 2020-05-02 ENCOUNTER — Telehealth: Payer: Self-pay | Admitting: Family Medicine

## 2020-05-02 MED ORDER — PANTOPRAZOLE SODIUM 20 MG PO TBEC: 20.0000 mg | DELAYED_RELEASE_TABLET | Freq: Every day | ORAL | 2 refills | Status: DC

## 2020-05-02 NOTE — Telephone Encounter (Signed)
Start protonix for gerd. F/u in office if not improving.

## 2020-06-07 ENCOUNTER — Other Ambulatory Visit (HOSPITAL_COMMUNITY): Payer: Self-pay | Admitting: Pharmacist

## 2020-06-21 ENCOUNTER — Other Ambulatory Visit (HOSPITAL_COMMUNITY): Payer: Self-pay

## 2020-06-21 MED FILL — COVID-19 At Home Antigen Test Kit: 4 days supply | Qty: 4 | Fill #0 | Status: AC

## 2020-06-21 MED FILL — COVID-19 At Home Antigen Test Kit: 4 days supply | Qty: 4 | Fill #0 | Status: CN

## 2020-07-15 ENCOUNTER — Other Ambulatory Visit (HOSPITAL_COMMUNITY): Payer: Self-pay

## 2020-07-29 ENCOUNTER — Ambulatory Visit (INDEPENDENT_AMBULATORY_CARE_PROVIDER_SITE_OTHER): Payer: No Typology Code available for payment source | Admitting: Family Medicine

## 2020-07-29 ENCOUNTER — Encounter: Payer: Self-pay | Admitting: Family Medicine

## 2020-07-29 ENCOUNTER — Other Ambulatory Visit (HOSPITAL_COMMUNITY): Payer: Self-pay

## 2020-07-29 VITALS — BP 122/76 | HR 89 | Temp 97.9°F | Ht 66.0 in | Wt 180.4 lb

## 2020-07-29 DIAGNOSIS — R1013 Epigastric pain: Secondary | ICD-10-CM

## 2020-07-29 DIAGNOSIS — K219 Gastro-esophageal reflux disease without esophagitis: Secondary | ICD-10-CM | POA: Diagnosis not present

## 2020-07-29 DIAGNOSIS — Z8619 Personal history of other infectious and parasitic diseases: Secondary | ICD-10-CM

## 2020-07-29 LAB — CBC WITH DIFFERENTIAL/PLATELET
Basophils Absolute: 0 10*3/uL (ref 0.0–0.1)
Basophils Relative: 0.7 % (ref 0.0–3.0)
Eosinophils Absolute: 0.1 10*3/uL (ref 0.0–0.7)
Eosinophils Relative: 2 % (ref 0.0–5.0)
HCT: 36.4 % (ref 36.0–46.0)
Hemoglobin: 12.1 g/dL (ref 12.0–15.0)
Lymphocytes Relative: 46 % (ref 12.0–46.0)
Lymphs Abs: 2.8 10*3/uL (ref 0.7–4.0)
MCHC: 33.2 g/dL (ref 30.0–36.0)
MCV: 81.2 fl (ref 78.0–100.0)
Monocytes Absolute: 0.4 10*3/uL (ref 0.1–1.0)
Monocytes Relative: 6.1 % (ref 3.0–12.0)
Neutro Abs: 2.7 10*3/uL (ref 1.4–7.7)
Neutrophils Relative %: 45.2 % (ref 43.0–77.0)
Platelets: 284 10*3/uL (ref 150.0–400.0)
RBC: 4.48 Mil/uL (ref 3.87–5.11)
RDW: 13.8 % (ref 11.5–15.5)
WBC: 6 10*3/uL (ref 4.0–10.5)

## 2020-07-29 LAB — COMPREHENSIVE METABOLIC PANEL
ALT: 9 U/L (ref 0–35)
AST: 12 U/L (ref 0–37)
Albumin: 4.3 g/dL (ref 3.5–5.2)
Alkaline Phosphatase: 55 U/L (ref 39–117)
BUN: 7 mg/dL (ref 6–23)
CO2: 27 mEq/L (ref 19–32)
Calcium: 9.4 mg/dL (ref 8.4–10.5)
Chloride: 102 mEq/L (ref 96–112)
Creatinine, Ser: 0.47 mg/dL (ref 0.40–1.20)
GFR: 116.13 mL/min (ref >60.00)
Glucose, Bld: 123 mg/dL — ABNORMAL HIGH (ref 70–99)
Potassium: 4 mEq/L (ref 3.5–5.1)
Sodium: 137 mEq/L (ref 135–145)
Total Bilirubin: 0.7 mg/dL (ref 0.2–1.2)
Total Protein: 7.6 g/dL (ref 6.0–8.3)

## 2020-07-29 LAB — LIPASE: Lipase: 20 U/L (ref 11.0–59.0)

## 2020-07-29 LAB — AMYLASE: Amylase: 43 U/L (ref 27–131)

## 2020-07-29 LAB — H. PYLORI ANTIBODY, IGG: H Pylori IgG: POSITIVE — AB

## 2020-07-29 MED ORDER — PANTOPRAZOLE SODIUM 20 MG PO TBEC: 20.0000 mg | DELAYED_RELEASE_TABLET | Freq: Two times a day (BID) | ORAL | 2 refills | Status: DC

## 2020-07-29 MED ORDER — LIDOCAINE VISCOUS HCL 2 % MT SOLN
5.0000 mL | Freq: Two times a day (BID) | OROMUCOSAL | 1 refills | Status: DC | PRN
  Filled 2020-07-29: qty 180, 9d supply, fill #0

## 2020-07-29 NOTE — Progress Notes (Signed)
Subjective  CC:  Chief Complaint  Patient presents with  . GI Problem    On going for a couple months and worsening.    HPI: Rachel [name] is a 45 y.o. female who presents to the office today to address the problems listed above in the chief complaint.  45 year old female with history of GERD, diabetes and hyperlipidemia presents for worsening abdominal pain.  Back in February we started Protonix 20 mg daily for classic GERD symptoms.  Initially, she did well on this.  Symptoms dissipated.  However over the last 2 weeks she is having daily, boring midepigastric pain that she describes as moderate to severe.  She denies associated nausea, vomiting, hematemesis, fevers, chills, change in bowel movements.  She does describe early satiety and bloating.  She was treated for H. pylori infection back in 2016.  She denies right upper quadrant pain or left upper quadrant pain or back pain.  She has no known history of gallstones.  Unfortunately, she is very stressed at both work and home.  Her son who is a quadriplegic has been ill and suffering with a complicated wound.  She feels she is handling her stressors overall well.  She does not take anything for mood.  She is on metformin for her diabetes.  She denies symptoms of hypoglycemia.   Assessment  1. Midepigastric pain   2. Gastroesophageal reflux disease, unspecified whether esophagitis present   3. History of Helicobacter pylori infection      Plan   Epigastric pain: Exam reveals a benign abdomen but she is very tender.  We will increase Protonix to twice daily, start GI cocktail, check lab work to rule out pancreatitis or biliary tract disease, hold metformin and recheck in 1 to 2 weeks.  If pain worsens, she will let me know.  To ER for hematemesis or fever.  I will check H. pylori antibiotic to see if this has cleared.  If it has remains positive, this could be related to past infection.  She is on a PPI so cannot check breath urea today.   Further evaluation will be ordered pending lab results if indicated.  If can get calm down, recommend GI referral.  Patient stands and agrees with care plan.  Follow up: 2 weeks for recheck 09/13/2020  Orders Placed This Encounter  Procedures  . Amylase  . CBC with Differential/Platelet  . Lipase  . Comprehensive metabolic panel  . H. pylori antibody, IgG   Meds ordered this encounter  Medications  . pantoprazole (PROTONIX) 20 MG tablet    Sig: Take 1 tablet (20 mg total) by mouth 2 (two) times daily.    Dispense:  30 tablet    Refill:  2  . GI Cocktail (alum & mag hydroxide-simethicone/lidocaine)oral mixture    Sig: Take 5-10 mLs by mouth 2 (two) times daily as needed.    Dispense:  180 mL    Refill:  1      I reviewed the patients updated PMH, FH, and SocHx.    Patient Active Problem List   Diagnosis Date Noted  . Hyperlipidemia associated with type 2 diabetes mellitus ([place]) 03/29/2020  . Adult situational stress disorder 06/26/2017  . Chronic migraine 12/28/2016  . Prediabetes 12/12/2016  . Insomnia 11/01/2015  . Orthostatic hypotension 08/17/2015  . Familial hyperlipidemia 08/17/2015   Current Meds  Medication Sig  . fludrocortisone (FLORINEF) 0.1 MG tablet TAKE 1 TABLET (0.1 MG TOTAL) BY MOUTH DAILY.  . fluticasone (FLONASE) 50 MCG/ACT  nasal spray Place 1 spray into both nostrils 2 (two) times daily.  Marland Kitchen GI Cocktail (alum & mag hydroxide-simethicone/lidocaine)oral mixture Take 5-10 mLs by mouth 2 (two) times daily as needed.  Marland Kitchen glucose blood (FREESTYLE LITE) test strip Use once daily to check blood sugar R73.03  . glucose monitoring kit (FREESTYLE) monitoring kit 1 each by Does not apply route daily.  . Lancets MISC Use to check blood sugars daily. Please provide brand covered by insurance Dx code: R73.03  . meclizine (ANTIVERT) 25 MG tablet Take 1 tablet (25 mg total) by mouth 3 (three) times daily as needed for dizziness.  . metFORMIN (GLUCOPHAGE) 850 MG tablet  TAKE 1 TABLET (850 MG TOTAL) BY MOUTH 2 (TWO) TIMES DAILY WITH A MEAL.  . Multiple Vitamin (MULTIVITAMIN WITH MINERALS) TABS tablet Take 1 tablet by mouth daily.  . rosuvastatin (CRESTOR) 40 MG tablet TAKE 1 TABLET (40 MG TOTAL) BY MOUTH DAILY.  . [DISCONTINUED] COVID-19 At Home Antigen Test KIT USE AS DIRECTED WITHIN PACKAGE INSTRUCTIONS (Patient taking differently: USE AS DIRECTED WITHIN PACKAGE INSTRUCTIONS)  . [DISCONTINUED] pantoprazole (PROTONIX) 20 MG tablet TAKE 1 TABLET (20 MG TOTAL) BY MOUTH DAILY.    Allergies: Patient has No Known Allergies. Family History: Patient family history includes Breast cancer in her paternal aunt; Heart disease in her sister; Hyperlipidemia in her daughter, mother, sister, and another family member; Spina bifida in her son. Social History:  Patient  reports that she has never smoked. She has never used smokeless tobacco. She reports that she does not drink alcohol and does not use drugs.  Review of Systems: Constitutional: Negative for fever malaise or anorexia Cardiovascular: negative for chest pain Respiratory: negative for SOB or persistent cough Gastrointestinal: negative for abdominal pain  Objective  Vitals: BP 122/76   Pulse 89   Temp 97.9 F (36.6 C) (Temporal)   Ht $R'5\' 6"'Ej$  (1.676 m)   Wt 180 lb 6.4 oz (81.8 kg)   SpO2 98%   BMI 29.12 kg/m  General: no acute distress , A&Ox3 HEENT: PEERL, conjunctiva normal, neck is supple Cardiovascular:  RRR without murmur or gallop.  Respiratory:  Good breath sounds bilaterally, CTAB with normal respiratory effort Gastrointestinal: soft, flat abdomen, normal active bowel sounds, no palpable masses, no hepatosplenomegaly, no appreciated hernias, mid epigastrium is tender without rebound or guarding, mild diffuse tenderness in lower quadrants.  No right upper quadrant tenderness, no CVA tenderness Skin:  Warm, no rashes     Commons side effects, risks, benefits, and alternatives for medications and  treatment plan prescribed today were discussed, and the patient expressed understanding of the given instructions. Patient is instructed to call or message via MyChart if he/she has any questions or concerns regarding our treatment plan. No barriers to understanding were identified. We discussed Red Flag symptoms and signs in detail. Patient expressed understanding regarding what to do in case of urgent or emergency type symptoms.   Medication list was reconciled, printed and provided to the patient in AVS. Patient instructions and summary information was reviewed with the patient as documented in the AVS. This note was prepared with assistance of Dragon voice recognition software. Occasional wrong-word or sound-a-like substitutions may have occurred due to the inherent limitations of voice recognition software  This visit occurred during the SARS-CoV-2 public health emergency.  Safety protocols were in place, including screening questions prior to the visit, additional usage of staff PPE, and extensive cleaning of exam room while observing appropriate contact time as indicated for disinfecting  solutions.

## 2020-07-29 NOTE — Patient Instructions (Signed)
Please follow up if symptoms do not improve or as needed.   Increase protonix to 20mg  bid and start gi cocktail as needed.  Hold metformin for 1-2 weeks.

## 2020-08-10 ENCOUNTER — Ambulatory Visit (INDEPENDENT_AMBULATORY_CARE_PROVIDER_SITE_OTHER): Payer: No Typology Code available for payment source | Admitting: Family Medicine

## 2020-08-10 ENCOUNTER — Encounter: Payer: Self-pay | Admitting: Family Medicine

## 2020-08-10 ENCOUNTER — Other Ambulatory Visit (HOSPITAL_COMMUNITY): Payer: Self-pay

## 2020-08-10 ENCOUNTER — Other Ambulatory Visit: Payer: Self-pay

## 2020-08-10 VITALS — BP 118/80 | HR 82 | Temp 98.1°F | Ht 66.0 in | Wt 178.0 lb

## 2020-08-10 DIAGNOSIS — I951 Orthostatic hypotension: Secondary | ICD-10-CM | POA: Diagnosis not present

## 2020-08-10 DIAGNOSIS — Z0000 Encounter for general adult medical examination without abnormal findings: Secondary | ICD-10-CM

## 2020-08-10 DIAGNOSIS — Z8619 Personal history of other infectious and parasitic diseases: Secondary | ICD-10-CM | POA: Diagnosis not present

## 2020-08-10 DIAGNOSIS — K219 Gastro-esophageal reflux disease without esophagitis: Secondary | ICD-10-CM

## 2020-08-10 DIAGNOSIS — E7849 Other hyperlipidemia: Secondary | ICD-10-CM

## 2020-08-10 DIAGNOSIS — E119 Type 2 diabetes mellitus without complications: Secondary | ICD-10-CM

## 2020-08-10 DIAGNOSIS — Z8711 Personal history of peptic ulcer disease: Secondary | ICD-10-CM

## 2020-08-10 LAB — LIPID PANEL
Cholesterol: 184 mg/dL (ref 0–200)
HDL: 44.7 mg/dL (ref >39.00)
LDL Cholesterol: 111 mg/dL — ABNORMAL HIGH (ref 0–99)
NonHDL: 138.96
Total CHOL/HDL Ratio: 4
Triglycerides: 142 mg/dL (ref 0.0–149.0)
VLDL: 28.4 mg/dL (ref 0.0–40.0)

## 2020-08-10 LAB — POCT GLYCOSYLATED HEMOGLOBIN (HGB A1C): Hemoglobin A1C: 6.3 % — AB (ref 4.0–5.6)

## 2020-08-10 MED ORDER — ALPRAZOLAM 0.25 MG PO TABS
0.2500 mg | ORAL_TABLET | Freq: Every day | ORAL | 0 refills | Status: DC | PRN
  Filled 2020-08-10: qty 20, 20d supply, fill #0

## 2020-08-10 NOTE — Progress Notes (Signed)
Subjective  Chief Complaint  Patient presents with  . Annual Exam    Not fasting  . Hyperlipidemia  . Diabetes  . Hypotension  . GI Problem    Discuss treatment.    HPI: Rachel [name] is a 45 y.o. female who presents to [facility] today for a Female Wellness Visit. She also has the concerns and/or needs as listed above in the chief complaint. These will be addressed in addition to the Health Maintenance Visit.   Wellness Visit: annual visit with health maintenance review and exam without Pap   Health maintenance: Patient will be due for Pap smear in August.  She defers mammograms.  We discussed Pneumovax, indicated by her diagnosis of diabetes but given multiple COVID vaccines this year, will defer till next year.  Chronic disease f/u and/or acute problem visit: (deemed necessary to be done in addition to the wellness visit):  Diabetes follow up: Her diabetic control is reported as Unchanged.  She continues on metformin XR 850 twice daily.  Tolerates this well. She denies exertional CP or SOB or symptomatic hypoglycemia. She denies foot sores or paresthesias.  She is not on an ACE inhibitor.  She has symptomatic hypotension.  She is due urine microalbuminuria check.  Familial hyperlipidemia: On Crestor high-dose.  Due for recheck.  She is been taking this since January.  Diet is fair.  She has started exercising at the gym and is working on weight loss.  She feels good about this.  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  He is trach dependent and quadriplegic.  He currently is undergoing extensive care for osteomyelitis and wound care.  She is having to travel back and forth to [facility] for appointments and is now finding herself having panic and anxiety symptoms surrounding the highway driving.  Never before she had panic symptoms.  She denies depressive symptoms.  She feels her anxiety is  overall well controlled and she copes well overall.  However she would like something if needed for further panic episodes.  Midepigastric pain: See last visit notes from 2 weeks ago.  We started high-dose Protonix at 20 mg twice daily.  Fortunately she reports she is improving albeit slowly.  She states she is about 50 to 65% improved.  She will have mild midepigastric pain and occasional sharp pains but nothing like she was having before.  Lab testing was again reviewed.  She is positive for H. pylori antibody.  She was treated back in 2015 for H. pylori infection with peptic ulcer disease.  She did have endoscopy at that time.  She denies melena, nausea, vomiting or hematemesis.  She is eating well.  She is working on [employer].  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  Immunization History  Administered Date(s) Administered  . Influenza,inj,Quad PF,6+ Mos 12/27/2019  . Influenza-Unspecified 12/13/2015, 12/06/2017, 12/09/2018  . PFIZER(Purple Top)SARS-COV-2 Vaccination 08/24/2019, 09/12/2019    Diabetes Related Lab Review: Lab Results  Component Value Date   HGBA1C 6.3 (A) 08/10/2020   HGBA1C 6.2 03/21/2020   HGBA1C 6.2 (H) 11/26/2019    No results found for: [place] Lab Results  Component Value Date   CREATININE 0.47 07/29/2020   BUN 7 07/29/2020   NA 137 07/29/2020   K 4.0 07/29/2020   CL 102 07/29/2020   CO2 27 07/29/2020   Lab Results  Component Value Date   CHOL 249 (H) 03/21/2020  CHOL 225 (H) 11/26/2019   CHOL 206 (H) 05/25/2019   Lab Results  Component Value Date   HDL 47.50 03/21/2020   HDL 55 11/26/2019   HDL 49 05/25/2019   Lab Results  Component Value Date   LDLCALC 164 (H) 03/21/2020   LDLCALC 143 (H) 11/26/2019   LDLCALC 139 (H) 05/25/2019   Lab Results  Component Value Date   TRIG 184.0 (H) 03/21/2020   TRIG 134 11/26/2019   TRIG 89 05/25/2019   Lab Results  Component Value Date   CHOLHDL 5 03/21/2020   CHOLHDL  4.1 11/26/2019   CHOLHDL 4.2 05/25/2019   No results found for: LDLDIRECT The 10-year ASCVD risk score Denman George(Goff DC Jr., et al., 2013) is: 2.3%   Values used to calculate the score:     Age: 89 years     Sex: Female     Is Non-Hispanic African American: No     Diabetic: Yes     Tobacco smoker: No     Systolic Blood Pressure: 118 mmHg     Is BP treated: No     HDL Cholesterol: 47.5 mg/dL     Total Cholesterol: 249 mg/dL  BP Readings from Last 3 Encounters:  08/10/20 118/80  07/29/20 122/76  03/29/20 104/72   Wt Readings from Last 3 Encounters:  08/10/20 178 lb (80.7 kg)  07/29/20 180 lb 6.4 oz (81.8 kg)  03/29/20 183 lb 4.8 oz (83.1 kg)    Health Maintenance  Topic Date Due  . URINE MICROALBUMIN  06/27/2018  . COVID-19 Vaccine (3 - Booster for Pfizer series) 02/12/2020  . PNEUMOCOCCAL POLYSACCHARIDE VACCINE AGE 36-64 HIGH RISK  08/01/2021 (Originally 08/03/1977)  . INFLUENZA VACCINE  10/17/2020  . PAP SMEAR-Modifier  10/31/2020  . HEMOGLOBIN A1C  02/10/2021  . OPHTHALMOLOGY EXAM  08/06/2021  . FOOT EXAM  08/10/2021  . TETANUS/TDAP  01/18/2024  . Hepatitis C Screening  Completed  . HIV Screening  Completed  . HPV VACCINES  Aged Out     Assessment  1. Annual physical exam   2. Controlled type 2 diabetes mellitus without complication, without long-term current use of insulin (HCC)   3. Orthostatic hypotension   4. Gastroesophageal reflux disease, unspecified whether esophagitis present   5. History of Helicobacter pylori infection   6. History of peptic ulcer   7. Familial hyperlipidemia      Plan  Female Wellness Visit:  Age appropriate Health Maintenance and Prevention measures were discussed with patient. Included topics are cancer screening recommendations, ways to keep healthy (see AVS) including dietary and exercise recommendations, regular eye and dental care, use of seat belts, and avoidance of moderate alcohol use and tobacco use.  Due for Pap till  August.  BMI: discussed patient's BMI and encouraged positive lifestyle modifications to help get to or maintain a target BMI.  HM needs and immunizations were addressed and ordered. See below for orders. See HM and immunization section for updates.  Defers Pneumovax: We will readdress in 6 to 12 months  Routine labs and screening tests ordered including cmp, cbc and lipids where appropriate.  Discussed recommendations regarding Vit D and calcium supplementation (see AVS)  Chronic disease management visit and/or acute problem visit:  History of peptic ulcer disease and H. pylori infection with GERD and possible ulcer symptoms: On high-dose Protonix and improving.  We will continue current treatment.  Check stool antigen for H. pylori, treat if positive.  Will monitor symptoms.  Hopefully in the next 6  to 12 weeks she will resolve her pain.  If she does not or worsens, refer to GI.  Patient understands and agrees with care plan.  Diabetes: Good control on metformin twice daily.  Check urine microalbuminuria.  Blood pressure is normal on Florinef.  Screens are up-to-date.  Hyperlipidemia: Recheck lipid panel on Crestor.  Goal LDL less than 70.  May need dose adjustment again.  Liver function tests were recently normal.  Continue Florinef for orthostatic hypotension, idiopathic.  Stress and adjustment disorder with panic symptoms: Counseling and education given.  No need or indication for chronic medications at this time.  Xanax, low-dose, 0.25 mg if needed for driving.  Patient will test dose at home first.   Follow up: Return in about 3 months (around 11/10/2020) for follow up Diabetes, recheck.  Orders Placed This Encounter  Procedures  . Lipid panel  . Microalbumin / creatinine urine ratio  . H. pylori antigen, stool  . POCT HgB A1C   Meds ordered this encounter  Medications  . ALPRAZolam (XANAX) 0.25 MG tablet    Sig: Take 1 tablet (0.25 mg total) by mouth daily as needed for  anxiety.    Dispense:  20 tablet    Refill:  0      Body mass index is 28.73 kg/m. Wt Readings from Last 3 Encounters:  08/10/20 178 lb (80.7 kg)  07/29/20 180 lb 6.4 oz (81.8 kg)  03/29/20 183 lb 4.8 oz (83.1 kg)     Patient Active Problem List   Diagnosis Date Noted  . Controlled type 2 diabetes mellitus without complication, without long-term current use of insulin (HCC) 08/10/2020  . GERD (gastroesophageal reflux disease) 08/10/2020  . History of Helicobacter pylori infection 08/10/2020  . History of peptic ulcer 08/10/2020  . Hyperlipidemia associated with type 2 diabetes mellitus (HCC) 03/29/2020  . Adjustment reaction with anxiety 06/26/2017  . Chronic migraine 12/28/2016    Referred to neurology. Did not tolerate topiramate.   . Insomnia 11/01/2015  . Orthostatic hypotension 08/17/2015  . Familial hyperlipidemia 08/17/2015   Health Maintenance  Topic Date Due  . URINE MICROALBUMIN  06/27/2018  . COVID-19 Vaccine (3 - Booster for Pfizer series) 02/12/2020  . PNEUMOCOCCAL POLYSACCHARIDE VACCINE AGE 64-64 HIGH RISK  08/01/2021 (Originally 08/03/1977)  . INFLUENZA VACCINE  10/17/2020  . PAP SMEAR-Modifier  10/31/2020  . HEMOGLOBIN A1C  02/10/2021  . OPHTHALMOLOGY EXAM  08/06/2021  . FOOT EXAM  08/10/2021  . TETANUS/TDAP  01/18/2024  . Hepatitis C Screening  Completed  . HIV Screening  Completed  . HPV VACCINES  Aged Out   Immunization History  Administered Date(s) Administered  . Influenza,inj,Quad PF,6+ Mos 12/27/2019  . Influenza-Unspecified 12/13/2015, 12/06/2017, 12/09/2018  . PFIZER(Purple Top)SARS-COV-2 Vaccination 08/24/2019, 09/12/2019   We updated and reviewed the patient's past history in detail and it is documented below. Allergies: Patient has No Known Allergies. Past Medical History Patient  has a past medical history of Anxiety, Familial hyperlipidemia (08/17/2015), Migraines, Orthostatic hypotension (08/17/2015), Prediabetes, and PUD (peptic  ulcer disease). Past Surgical History Patient  has a past surgical history that includes Cesarean section. Family History: Patient family history includes Breast cancer in her paternal aunt; Heart disease in her sister; Hyperlipidemia in her daughter, mother, sister, and another family member; Spina bifida in her son. Social History:  Patient  reports that she has never smoked. She has never used smokeless tobacco. She reports that she does not drink alcohol and does not use drugs.  Review of  Systems: Constitutional: negative for fever or malaise Ophthalmic: negative for photophobia, double vision or loss of vision Cardiovascular: negative for chest pain, dyspnea on exertion, or new LE swelling Respiratory: negative for SOB or persistent cough Gastrointestinal: negative for abdominal pain, change in bowel habits or melena Genitourinary: negative for dysuria or gross hematuria, no abnormal uterine bleeding or disharge Musculoskeletal: negative for new gait disturbance or muscular weakness Integumentary: negative for new or persistent rashes, no breast lumps Neurological: negative for TIA or stroke symptoms Psychiatric: negative for SI or delusions Allergic/Immunologic: negative for hives  Patient Care Team    Relationship Specialty Notifications Start End  Willow Ora, MD PCP - General Family Medicine  03/21/20     Objective  Vitals: BP 118/80   Pulse 82   Temp 98.1 F (36.7 C) (Temporal)   Ht 5\' 6"  (1.676 m)   Wt 178 lb (80.7 kg)   SpO2 99%   BMI 28.73 kg/m  General:  Well developed, well nourished, no acute distress  Psych:  Alert and orientedx3,normal mood and affect HEENT:  Normocephalic, atraumatic, non-icteric sclera,  supple neck without adenopathy, mass or thyromegaly Cardiovascular:  Normal S1, S2, RRR without gallop, rub or murmur Respiratory:  Good breath sounds bilaterally, CTAB with normal respiratory effort Gastrointestinal: normal bowel sounds, soft, mild  midepigastric tenderness without rebound guarding or masses, no noted masses. No HSM MSK: no deformities, contusions. Joints are without erythema or swelling.  Skin:  Warm, no rashes or suspicious lesions noted Neurologic:    Mental status is normal. CN 2-11 are normal. Gross motor and sensory exams are normal. Normal gait. No tremor Breast Exam: No mass, skin retraction or nipple discharge is appreciated in either breast. No axillary adenopathy. Fibrocystic changes are not noted  Lab Results  Component Value Date   LIPASE 20.0 07/29/2020     Commons side effects, risks, benefits, and alternatives for medications and treatment plan prescribed today were discussed, and the patient expressed understanding of the given instructions. Patient is instructed to call or message via MyChart if he/she has any questions or concerns regarding our treatment plan. No barriers to understanding were identified. We discussed Red Flag symptoms and signs in detail. Patient expressed understanding regarding what to do in case of urgent or emergency type symptoms.   Medication list was reconciled, printed and provided to the patient in AVS. Patient instructions and summary information was reviewed with the patient as documented in the AVS. This note was prepared with assistance of Dragon voice recognition software. Occasional wrong-word or sound-a-like substitutions may have occurred due to the inherent limitations of voice recognition software  This visit occurred during the SARS-CoV-2 public health emergency.  Safety protocols were in place, including screening questions prior to the visit, additional usage of staff PPE, and extensive cleaning of exam room while observing appropriate contact time as indicated for disinfecting solutions.

## 2020-08-10 NOTE — Patient Instructions (Signed)
Please return in 3 months to recheck stomach pain and diabetes.   I will release your lab results to you on your MyChart account with further instructions. Please reply with any questions.   If you have any questions or concerns, please don't hesitate to send me a message via MyChart or call the office at [phone]. Thank you for visiting with Rachel [name] today! It's our pleasure caring for you.

## 2020-08-11 LAB — MICROALBUMIN / CREATININE URINE RATIO
Creatinine,U: 79.2 mg/dL
Microalb Creat Ratio: 1 mg/g (ref 0.0–30.0)
Microalb, Ur: 0.8 mg/dL (ref 0.0–1.9)

## 2020-08-12 ENCOUNTER — Ambulatory Visit: Payer: No Typology Code available for payment source | Admitting: Family Medicine

## 2020-08-16 LAB — H. PYLORI ANTIGEN, STOOL: H pylori Ag, Stl: NEGATIVE

## 2020-08-22 ENCOUNTER — Other Ambulatory Visit (HOSPITAL_COMMUNITY): Payer: Self-pay

## 2020-08-22 ENCOUNTER — Other Ambulatory Visit: Payer: Self-pay | Admitting: Family Medicine

## 2020-08-22 MED ORDER — PANTOPRAZOLE SODIUM 20 MG PO TBEC
20.0000 mg | DELAYED_RELEASE_TABLET | Freq: Two times a day (BID) | ORAL | 2 refills | Status: DC
  Filled 2020-08-22: qty 60, 30d supply, fill #0
  Filled 2020-09-29: qty 120, 60d supply, fill #1

## 2020-08-22 MED FILL — Rosuvastatin Calcium Tab 40 MG: ORAL | 90 days supply | Qty: 90 | Fill #0 | Status: AC

## 2020-09-01 NOTE — Progress Notes (Signed)
Cardiology Office Note:    Date:  09/02/2020   ID:  [name], DOB [birth year], MRN [id number]  PCP:  Leamon Arnt, MD  [facility] Cardiologist:  Werner Lean, MD  [place] Electrophysiologist:  None   CC:  Follow up Orthostatic Hypotension  History of Present Illness:    Rachel [name] is a 45 y.o. female with a hx of  orthostatic blood pressure, DM HLD on rosuvastatin 10 mg, who presented for evaluation 12/25/19.  In interval normal BNP and Echo.  Sparring use of Florinef. Last seen 03/29/20.In interim of this visit, patient had PC MD labs and still not at goal.   Patient notes that she is doing OK.  Since last visit notes slight joint pain with statin.  There are no interval hospital/ED visit.    No chest pain or pressure .  No SOB/DOE and no PND/Orthopnea.  No weight gain or leg swelling.  No palpitations or syncope.  Minimal florinef use.   Past Medical History:  Diagnosis Date   Anxiety    Familial hyperlipidemia 08/17/2015   Migraines    Orthostatic hypotension 08/17/2015   Prediabetes    PUD (peptic ulcer disease)     Past Surgical History:  Procedure Laterality Date   CESAREAN SECTION      Current Medications: Current Meds  Medication Sig   ALPRAZolam (XANAX) 0.25 MG tablet Take 1 tablet (0.25 mg total) by mouth daily as needed for anxiety.   ezetimibe (ZETIA) 10 MG tablet Take 1 tablet (10 mg total) by mouth daily.   fludrocortisone (FLORINEF) 0.1 MG tablet TAKE 1 TABLET (0.1 MG TOTAL) BY MOUTH DAILY. (Patient taking differently: as needed.)   fluticasone (FLONASE) 50 MCG/ACT nasal spray Place 1 spray into both nostrils 2 (two) times daily. (Patient taking differently: Place 1 spray into both nostrils as needed.)   glucose blood (FREESTYLE LITE) test strip Use once daily to check blood sugar R73.03   glucose monitoring kit (FREESTYLE) monitoring kit 1 each by Does not apply route daily.   Lancets MISC Use to check blood sugars daily. Please  provide brand covered by insurance Dx code: R73.03   meclizine (ANTIVERT) 25 MG tablet Take 1 tablet (25 mg total) by mouth 3 (three) times daily as needed for dizziness.   metFORMIN (GLUCOPHAGE) 850 MG tablet TAKE 1 TABLET (850 MG TOTAL) BY MOUTH 2 (TWO) TIMES DAILY WITH A MEAL.   Multiple Vitamin (MULTIVITAMIN WITH MINERALS) TABS tablet Take 1 tablet by mouth daily.   pantoprazole (PROTONIX) 20 MG tablet Take 1 tablet (20 mg total) by mouth 2 (two) times daily.   rosuvastatin (CRESTOR) 40 MG tablet TAKE 1 TABLET (40 MG TOTAL) BY MOUTH DAILY.     Allergies:   Patient has no known allergies.   Social History   Socioeconomic History   Marital status: Married    Spouse name: Not on file   Number of children: 2   Years of education: Not on file   Highest education level: Not on file  Occupational History   Occupation: clinical supervisor    Employer: [employer]  Tobacco Use   Smoking status: Never   Smokeless tobacco: Never  Substance and Sexual Activity   Alcohol use: No   Drug use: No   Sexual activity: Yes    Birth control/protection: Condom  Other Topics Concern   Not on file  Social History Narrative   Lives with her husband and their 2 children   Her son  is wheelchair bound due to spina bifida   Social Determinants of Health   Financial Resource Strain: Not on file  Food Insecurity: Not on file  Transportation Needs: Not on file  Physical Activity: Not on file  Stress: Not on file  Social Connections: Not on file     Family History: The patient's family history includes Breast cancer in her paternal aunt; Heart disease in her sister; Hyperlipidemia in her daughter, mother, sister, and another family member; Spina bifida in her son. There is no history of Cancer.  History of coronary artery disease for no members History of heart failure notable for no members, sister has valve replacement. History of arrhythmia notable for no members.  ROS:   Please see the  history of present illness.    All other systems reviewed and are negative.  EKGs/Labs/Other Studies Reviewed:    The following studies were reviewed today:  EKG:  12/25/19: SR 78 no TWI, no q waves  Recent Labs: 12/25/2019: B Natriuretic Peptide 30.6 07/29/2020: ALT 9; BUN 7; Creatinine, Ser 0.47; Hemoglobin 12.1; Platelets 284.0; Potassium 4.0; Sodium 137  Recent Lipid Panel    Component Value Date/Time   CHOL 184 08/10/2020 1141   CHOL 355 (H) 06/26/2017 1108   TRIG 142.0 08/10/2020 1141   HDL 44.70 08/10/2020 1141   HDL 59 06/26/2017 1108   CHOLHDL 4 08/10/2020 1141   VLDL 28.4 08/10/2020 1141   LDLCALC 111 (H) 08/10/2020 1141   LDLCALC 279 (H) 06/26/2017 1108    Transthoracic Echocardiogram: Date: 01/15/20 Results: Normal Biventricular Function IMPRESSIONS   1. Left ventricular ejection fraction, by estimation, is 60 to 65%. The  left ventricle has normal function. The left ventricle has no regional  wall motion abnormalities. Left ventricular diastolic parameters were  normal.   2. Right ventricular systolic function is normal. The right ventricular  size is normal.   3. The mitral valve is normal in structure. Trivial mitral valve  regurgitation. No evidence of mitral stenosis.   4. The aortic valve is tricuspid. Aortic valve regurgitation is not  visualized. Mild aortic valve sclerosis is present, with no evidence of  aortic valve stenosis.   5. The inferior vena cava is normal in size with greater than 50%  respiratory variability, suggesting right atrial pressure of 3 mmHg.   ECG Stress Testing: Date: 2017 Results:Negative ECG Stress test in 2017.  Physical Exam:    VS:  BP 119/80   Pulse 77   Ht _0  (1.676 m)   Wt 82.3 kg   SpO2 99%   BMI 29.28 kg/m     Orthostatic blood pressure  18878 74 supine 118/81 80 sitting 125/78  83 standing 125/780 82 persistent standing   Wt Readings from Last 3 Encounters:  09/02/20 82.3 kg  08/10/20 80.7 kg   07/29/20 81.8 kg    GEN:  Well nourished, well developed in no acute distress HEENT: Normal NECK: No JVD LYMPHATICS: No lymphadenopathy CARDIAC: RRR, no murmurs, rubs, gallops RESPIRATORY:  Clear to auscultation without rales, wheezing or rhonchi  ABDOMEN: Soft, non-tender, non-distended MUSCULOSKELETAL:  No edema; SKIN: Warm and dry NEUROLOGIC:  Alert and oriented x 3 PSYCHIATRIC:  Normal affect   ASSESSMENT:    1. Hyperlipidemia associated with type 2 diabetes mellitus ([place])   2. Orthostatic hypotension   3. Familial hyperlipidemia   4. Myalgia due to statin     PLAN:     Hyperlipidemia (famililal) with DM Familial Cholesterolemia Statin myalgia without evidence  of rhabdomyolysis - had originally had shared decision making about LDL goal less than 100; but amenable for more aggressive prevention strategy - patient at this time is tolerated myalgias, will add Zetia and concurrently refer to lipid clinic - discussed with PSCK9i and Bempedoic Acid as potentially interventions - offered CAC scan and Lp(a), but will defer at this time - gave education on dietary changes  Orthostatic Hypotension - asymptomatic - has Florinef 0.1 mg if needed   One year follow up unless new symptoms or abnormal test results warranting change in plan    Medication Adjustments/Labs and Tests Ordered: Current medicines are reviewed at length with the patient today.  Concerns regarding medicines are outlined above.  Orders Placed This Encounter  Procedures   AMB Referral to [facility] Pharm-D    Meds ordered this encounter  Medications   ezetimibe (ZETIA) 10 MG tablet    Sig: Take 1 tablet (10 mg total) by mouth daily.    Dispense:  90 tablet    Refill:  3     Patient Instructions  Medication Instructions:  Your physician has recommended you make the following change in your medication:   START:  Zetia 10 mg by mouth daily  *If you need a refill on your cardiac medications  before your next appointment, please call your pharmacy*   Lab Work: NONE If you have labs (blood work) drawn today and your tests are completely normal, you will receive your results only by: [place] (if you have MyChart) OR A paper copy in the mail If you have any lab test that is abnormal or we need to change your treatment, we will call you to review the results.   Testing/Procedures: Your physician has requested that you see the [facility].     Follow-Up: At [facility], you and your health needs are our priority.  As part of our continuing mission to provide you with exceptional heart care, we have created designated Provider Care Teams.  These Care Teams include your primary Cardiologist (physician) and Advanced Practice Providers (APPs -  Physician Assistants and Nurse Practitioners) who all work together to provide you with the care you need, when you need it.   Your next appointment:   12 month(s)  The format for your next appointment:   In Person  Provider:   You may see Werner Lean, MD or one of the following Advanced Practice Providers on your designated Care Team:   Melina Copa, PA-C Ermalinda Barrios, PA-C        Signed, Werner Lean, MD  09/02/2020 9:02 AM    [place]

## 2020-09-02 ENCOUNTER — Ambulatory Visit: Payer: No Typology Code available for payment source | Admitting: Internal Medicine

## 2020-09-02 ENCOUNTER — Other Ambulatory Visit: Payer: Self-pay

## 2020-09-02 ENCOUNTER — Other Ambulatory Visit (HOSPITAL_COMMUNITY): Payer: Self-pay

## 2020-09-02 ENCOUNTER — Encounter: Payer: Self-pay | Admitting: Internal Medicine

## 2020-09-02 VITALS — BP 119/80 | HR 77 | Ht 66.0 in | Wt 181.4 lb

## 2020-09-02 DIAGNOSIS — E785 Hyperlipidemia, unspecified: Secondary | ICD-10-CM

## 2020-09-02 DIAGNOSIS — T466X5A Adverse effect of antihyperlipidemic and antiarteriosclerotic drugs, initial encounter: Secondary | ICD-10-CM

## 2020-09-02 DIAGNOSIS — E1169 Type 2 diabetes mellitus with other specified complication: Secondary | ICD-10-CM | POA: Diagnosis not present

## 2020-09-02 DIAGNOSIS — I951 Orthostatic hypotension: Secondary | ICD-10-CM

## 2020-09-02 DIAGNOSIS — M791 Myalgia, unspecified site: Secondary | ICD-10-CM | POA: Insufficient documentation

## 2020-09-02 DIAGNOSIS — E7849 Other hyperlipidemia: Secondary | ICD-10-CM

## 2020-09-02 MED ORDER — EZETIMIBE 10 MG PO TABS
10.0000 mg | ORAL_TABLET | Freq: Every day | ORAL | 3 refills | Status: DC
  Filled 2020-09-02 – 2020-12-27 (×2): qty 90, 90d supply, fill #0
  Filled 2021-06-12: qty 90, 90d supply, fill #1

## 2020-09-02 NOTE — Patient Instructions (Signed)
Medication Instructions:  Your physician has recommended you make the following change in your medication:   START:  Zetia 10 mg by mouth daily  *If you need a refill on your cardiac medications before your next appointment, please call your pharmacy*   Lab Work: NONE If you have labs (blood work) drawn today and your tests are completely normal, you will receive your results only by: MyChart Message (if you have MyChart) OR A paper copy in the mail If you have any lab test that is abnormal or we need to change your treatment, we will call you to review the results.   Testing/Procedures: Your physician has requested that you see the Lipid Clinic.     Follow-Up: At [facility], you and your health needs are our priority.  As part of our continuing mission to provide you with exceptional heart care, we have created designated Provider Care Teams.  These Care Teams include your primary Cardiologist (physician) and Advanced Practice Providers (APPs -  Physician Assistants and Nurse Practitioners) who all work together to provide you with the care you need, when you need it.   Your next appointment:   12 month(s)  The format for your next appointment:   In Person  Provider:   You may see Christell Constant, MD or one of the following Advanced Practice Providers on your designated Care Team:   Ronie Spies, PA-C Jacolyn Reedy, PA-C

## 2020-09-07 ENCOUNTER — Encounter: Payer: Self-pay | Admitting: Family Medicine

## 2020-09-07 ENCOUNTER — Other Ambulatory Visit (HOSPITAL_COMMUNITY): Payer: Self-pay

## 2020-09-07 ENCOUNTER — Telehealth (INDEPENDENT_AMBULATORY_CARE_PROVIDER_SITE_OTHER): Payer: No Typology Code available for payment source | Admitting: Family Medicine

## 2020-09-07 ENCOUNTER — Other Ambulatory Visit: Payer: Self-pay

## 2020-09-07 ENCOUNTER — Telehealth: Payer: No Typology Code available for payment source | Admitting: Family Medicine

## 2020-09-07 DIAGNOSIS — J029 Acute pharyngitis, unspecified: Secondary | ICD-10-CM

## 2020-09-07 MED ORDER — AMOXICILLIN 875 MG PO TABS
875.0000 mg | ORAL_TABLET | Freq: Two times a day (BID) | ORAL | 0 refills | Status: AC
  Filled 2020-09-07: qty 14, 7d supply, fill #0

## 2020-09-07 NOTE — Progress Notes (Signed)
Virtual Visit via Video Note  Subjective  CC:  Chief Complaint  Patient presents with   Sore Throat    Ongoing for 3 days. Has a negative covid   Fever    102     I connected with Rachel [name] on 09/07/20 at 11:30 AM EDT by a video enabled telemedicine application and verified that I am speaking with the correct person using two identifiers. Location patient: Home Location provider: [facility] [place], Office Persons participating in the virtual visit: [name], Leamon Arnt, MD [place]  I discussed the limitations of evaluation and management by telemedicine and the availability of in person appointments. The patient expressed understanding and agreed to proceed. HPI: Rachel [name] is a 45 y.o. female who was contacted today to address the problems listed above in the chief complaint. 45 year old with controlled type 2 diabetes complains of 48 hours of sore throat, fever to 102, nasal congestion, hoarseness and headache.  Symptoms came on suddenly and started with a sore throat.  Sore throat is moderate to severe.  She is able to swallow liquids.  She took a COVID test from health at work yesterday, but PCR test was negative.  She is fully vaccinated.  She denies shortness of breath or chest pain.  No nausea vomiting or diarrhea.  No abdominal pain.  Her fever is responsive to Advil.  No sick contacts.She has history of recurrent strep.  Symptoms feel similar.  Assessment  1. Acute pharyngitis, unspecified etiology      Plan  Acute pharyngitis, likely strep throat: Treat with amoxicillin 875 twice daily x7 days.  Pain relief with Advil and salt water gargles.  Push fluids.  Rest.  Decongestants as needed.  Follow-up if symptoms are worsening.  I discussed the assessment and treatment plan with the patient. The patient was provided an opportunity to ask questions and all were answered. The patient agreed with the plan and demonstrated an  understanding of the instructions.   The patient was advised to call back or seek an in-person evaluation if the symptoms worsen or if the condition fails to improve as anticipated. Follow up:   09/13/2020  Meds ordered this encounter  Medications   amoxicillin (AMOXIL) 875 MG tablet    Sig: Take 1 tablet (875 mg total) by mouth 2 (two) times daily for 7 days.    Dispense:  14 tablet    Refill:  0      I reviewed the patients updated PMH, FH, and SocHx.    Patient Active Problem List   Diagnosis Date Noted   Myalgia due to statin 09/02/2020   Controlled type 2 diabetes mellitus without complication, without long-term current use of insulin ([place]) 08/10/2020   GERD (gastroesophageal reflux disease) 45/99/7741   History of Helicobacter pylori infection 08/10/2020   History of peptic ulcer 08/10/2020   Hyperlipidemia associated with type 2 diabetes mellitus ([place]) 03/29/2020   Adjustment reaction with anxiety 06/26/2017   Chronic migraine 12/28/2016   Insomnia 11/01/2015   Orthostatic hypotension 08/17/2015   Familial hyperlipidemia 08/17/2015   Current Meds  Medication Sig   ALPRAZolam (XANAX) 0.25 MG tablet Take 1 tablet (0.25 mg total) by mouth daily as needed for anxiety.   amoxicillin (AMOXIL) 875 MG tablet Take 1 tablet (875 mg total) by mouth 2 (two) times daily for 7 days.   ezetimibe (ZETIA) 10 MG tablet Take 1 tablet (10 mg total) by mouth daily.  fludrocortisone (FLORINEF) 0.1 MG tablet TAKE 1 TABLET (0.1 MG TOTAL) BY MOUTH DAILY. (Patient taking differently: as needed.)   fluticasone (FLONASE) 50 MCG/ACT nasal spray Place 1 spray into both nostrils 2 (two) times daily. (Patient taking differently: Place 1 spray into both nostrils as needed.)   glucose blood (FREESTYLE LITE) test strip Use once daily to check blood sugar R73.03   glucose monitoring kit (FREESTYLE) monitoring kit 1 each by Does not apply route daily.   Lancets MISC Use to check blood sugars daily. Please  provide brand covered by insurance Dx code: R73.03   meclizine (ANTIVERT) 25 MG tablet Take 1 tablet (25 mg total) by mouth 3 (three) times daily as needed for dizziness.   metFORMIN (GLUCOPHAGE) 850 MG tablet TAKE 1 TABLET (850 MG TOTAL) BY MOUTH 2 (TWO) TIMES DAILY WITH A MEAL.   Multiple Vitamin (MULTIVITAMIN WITH MINERALS) TABS tablet Take 1 tablet by mouth daily.   pantoprazole (PROTONIX) 20 MG tablet Take 1 tablet (20 mg total) by mouth 2 (two) times daily.   rosuvastatin (CRESTOR) 40 MG tablet TAKE 1 TABLET (40 MG TOTAL) BY MOUTH DAILY.    Allergies: Patient has No Known Allergies. Family History: Patient family history includes Breast cancer in her paternal aunt; Heart disease in her sister; Hyperlipidemia in her daughter, mother, sister, and another family member; Spina bifida in her son. Social History:  Patient  reports that she has never smoked. She has never used smokeless tobacco. She reports that she does not drink alcohol and does not use drugs.  Review of Systems: Constitutional: Negative for fever malaise or anorexia Cardiovascular: negative for chest pain Respiratory: negative for SOB or persistent cough Gastrointestinal: negative for abdominal pain  OBJECTIVE Vitals: There were no vitals taken for this visit. General: Nontoxic-appearing but appears uncomfortable, A&Ox3 Hoarse voice.  Appears painful to swallow.  No respiratory distress.  Leamon Arnt, MD

## 2020-09-13 ENCOUNTER — Encounter: Payer: No Typology Code available for payment source | Admitting: Family Medicine

## 2020-09-15 ENCOUNTER — Other Ambulatory Visit: Payer: Self-pay

## 2020-09-15 ENCOUNTER — Other Ambulatory Visit (HOSPITAL_COMMUNITY): Payer: Self-pay

## 2020-09-15 ENCOUNTER — Ambulatory Visit (INDEPENDENT_AMBULATORY_CARE_PROVIDER_SITE_OTHER): Payer: No Typology Code available for payment source | Admitting: Pharmacist

## 2020-09-15 DIAGNOSIS — E7849 Other hyperlipidemia: Secondary | ICD-10-CM | POA: Diagnosis not present

## 2020-09-15 MED ORDER — ROSUVASTATIN CALCIUM 5 MG PO TABS
5.0000 mg | ORAL_TABLET | Freq: Every day | ORAL | 11 refills | Status: DC
  Filled 2020-09-15: qty 30, 30d supply, fill #0
  Filled 2020-11-24: qty 30, 30d supply, fill #1
  Filled 2020-11-24: qty 90, 90d supply, fill #0
  Filled 2020-11-24: qty 30, 30d supply, fill #0

## 2020-09-15 MED ORDER — REPATHA SURECLICK 140 MG/ML ~~LOC~~ SOAJ
1.0000 "pen " | SUBCUTANEOUS | 11 refills | Status: DC
  Filled 2020-09-15: qty 2, 28d supply, fill #0
  Filled 2020-11-14: qty 2, 28d supply, fill #1
  Filled 2020-11-16 – 2020-11-24 (×3): qty 2, 28d supply, fill #0
  Filled 2020-12-27: qty 2, 28d supply, fill #1
  Filled 2021-01-22: qty 2, 28d supply, fill #2
  Filled 2021-02-28: qty 2, 28d supply, fill #3
  Filled 2021-03-22: qty 2, 28d supply, fill #4
  Filled 2021-04-16: qty 2, 28d supply, fill #5
  Filled 2021-05-15: qty 2, 28d supply, fill #6
  Filled 2021-06-12: qty 2, 28d supply, fill #7
  Filled 2021-08-01: qty 2, 28d supply, fill #8
  Filled 2021-09-06: qty 2, 28d supply, fill #9

## 2020-09-15 NOTE — Patient Instructions (Addendum)
It was nice to see you today!  Your baseline LDL cholesterol is 279. This improved to 111 on rosuvastatin (Crestor) 40mg  once daily  Your LDL goal is < 70  Stop your rosuvastatin for 1-2 weeks and see if your joint pain improves. Then, start lower dose of rosuvastatin 5mg  once daily to see if you tolerate this better  Continue on your Zetia (ezetimibe)  I will submit information to your insurance to see if they will cover Repatha or Praluent. They are injections that are given once every 2 weeks in the fatty tissue of your stomach. They lower your LDL cholesterol by 60%.  Call , PharmD with any questions #[phone]

## 2020-09-15 NOTE — Progress Notes (Signed)
Patient ID: Rachel [name]                 DOB: Mar 20, 1975                    MRN: [id number]     HPI: Rachel [name] is a 45 y.o. female patient referred to lipid clinic by Dr Gasper Sells. PMH is significant for orthostatic hypotension on prn Florinef, pre-DM, HLD. Pt reported some joint pain/myalgias on rosuvastatin 10mg  daily, however reports they were tolerable. Zetia was added at 09/02/20 visit and pt was referred to lipid clinic to discuss PCSK9i or Nexletol. CAC scoring and Lp(a) were offered but pt declined.  Pt presents today in good spirits. She has been taking higher dose of rosuvastatin 40mg  for the past 4 months or so. She previously took atorvastatin 80mg  for a few years but noticed gradual onset joint pain. She changed over to rosuvastatin without a statin holiday in between. Did not notice any improvement in joint pain. Reports her whole family has elevated cholesterol (siblings, children, parents), no family history of CAD or stroke though. Pt negative for xanthelasmas or corneal arcus on exam today. Dutch lipid score is 5 due to baseline LDL of 279 for probable dx of familial hypercholesterolemia. She has been tolerating ezetimibe well since she started this.  Current Medications: rosuvastatin 40mg  daily, ezetimibe 10mg  daily Intolerances: atorvastatin 40-80mg  daily - joint pain Risk Factors: HeFH, pre-DM LDL goal: 70mg /dL  Diet: Likes chicken and fish, no other meat. Likes eggs occasionally, beans, lentil soup. Bakes meat, no fried food. Cooks at home primarily.   Exercise: Walks 1-2 days per week  Family History: Breast cancer in her paternal aunt; Heart disease in her sister; Hyperlipidemia in her daughter, mother, sister, and another family member; Spina bifida in her son. There is no history of Cancer.  History of coronary artery disease for no members History of heart failure notable for no members, sister has valve replacement.  Social History: Denies tobacco, alcohol  and drug use.  Labs: 08/10/20: TC 184, TG 142, HDL 45, LDL 111 (rosuvastatin 40mg  daily) 06/26/17: TC 355, TG 85, HDL 59, LDL 279 (no LLT)   Past Medical History:  Diagnosis Date   Anxiety    Familial hyperlipidemia 08/17/2015   Migraines    Orthostatic hypotension 08/17/2015   Prediabetes    PUD (peptic ulcer disease)     Current Outpatient Medications on File Prior to Visit  Medication Sig Dispense Refill   ALPRAZolam (XANAX) 0.25 MG tablet Take 1 tablet (0.25 mg total) by mouth daily as needed for anxiety. 20 tablet 0   ezetimibe (ZETIA) 10 MG tablet Take 1 tablet (10 mg total) by mouth daily. 90 tablet 3   fludrocortisone (FLORINEF) 0.1 MG tablet TAKE 1 TABLET (0.1 MG TOTAL) BY MOUTH DAILY. (Patient taking differently: as needed.) 30 tablet 6   fluticasone (FLONASE) 50 MCG/ACT nasal spray Place 1 spray into both nostrils 2 (two) times daily. (Patient taking differently: Place 1 spray into both nostrils as needed.) 16 g 6   glucose blood (FREESTYLE LITE) test strip Use once daily to check blood sugar R73.03 100 each 4   glucose monitoring kit (FREESTYLE) monitoring kit 1 each by Does not apply route daily. 1 each 0   Lancets MISC Use to check blood sugars daily. Please provide brand covered by insurance Dx code: R73.03 100 each 3   meclizine (ANTIVERT) 25 MG tablet Take 1 tablet (25 mg total) by mouth 3 (  three) times daily as needed for dizziness. 30 tablet 0   metFORMIN (GLUCOPHAGE) 850 MG tablet TAKE 1 TABLET (850 MG TOTAL) BY MOUTH 2 (TWO) TIMES DAILY WITH A MEAL. 180 tablet 1   Multiple Vitamin (MULTIVITAMIN WITH MINERALS) TABS tablet Take 1 tablet by mouth daily.     pantoprazole (PROTONIX) 20 MG tablet Take 1 tablet (20 mg total) by mouth 2 (two) times daily. 30 tablet 2   rosuvastatin (CRESTOR) 40 MG tablet TAKE 1 TABLET (40 MG TOTAL) BY MOUTH DAILY. 90 tablet 3   No current facility-administered medications on file prior to visit.    No Known  Allergies  Assessment/Plan:  1. Hyperlipidemia - Baseline LDL 279 indicative of probable familial hypercholesterolemia (Dutch Lipid score of 5), negative for xanthelasmas or corneal arcus on exam today. Many first degree relatives with elevated cholesterol but no CAD or stroke. She is still experiencing joint pain on rosuvastatin $RemoveBeforeD'40mg'gLIekxvxEBvOAe$  daily that feels similar to when she took atorvastatin $RemoveBeforeDEI'40mg'FaIwQnsvUMNSsLgB$  daily. Advised her to stop rosuvastatin for 1-2 weeks to see if her symptoms resolve. Then, will start lower dose of rosuvastatin $RemoveBeforeD'5mg'plYFJJGfjzGFYA$  to see if she tolerates this better. Discussed that she will need more than 1 med to bring her LDL to goal < 70 due to baseline LDL of almost 300. Discussed changing ezetimibe to Nexlizet, or adding PCSK9i therapy. Pt prefers to try PCSK9i therapy. Counseled pt on  injection technique, benefits, and side effects today. Will start Repatha $RemoveBefor'140mg'OdkFeEUDNXIi$  Q2W. Med is on formulary, $5 copay card sent to pharmacy as well (ID [id number], BIN [id number], PCN [id number], GRP [id number]). She will have PCP recheck lipids in a few months.   Gregary Blackard E. Ladavia Lindenbaum, PharmD, BCACP, [place] [address], [place],  [address] Phone: [phone]; Fax: [phone] 09/15/2020 3:10 PM

## 2020-09-16 ENCOUNTER — Other Ambulatory Visit (HOSPITAL_COMMUNITY): Payer: Self-pay

## 2020-09-26 ENCOUNTER — Other Ambulatory Visit (HOSPITAL_COMMUNITY): Payer: Self-pay

## 2020-09-28 ENCOUNTER — Other Ambulatory Visit (HOSPITAL_COMMUNITY): Payer: Self-pay

## 2020-09-29 ENCOUNTER — Other Ambulatory Visit (HOSPITAL_COMMUNITY): Payer: Self-pay

## 2020-09-29 ENCOUNTER — Other Ambulatory Visit: Payer: Self-pay | Admitting: Family Medicine

## 2020-09-30 ENCOUNTER — Other Ambulatory Visit (HOSPITAL_COMMUNITY): Payer: Self-pay

## 2020-09-30 MED ORDER — METFORMIN HCL 850 MG PO TABS
ORAL_TABLET | ORAL | 3 refills | Status: DC
  Filled 2020-09-30: qty 360, fill #0

## 2020-11-10 ENCOUNTER — Ambulatory Visit: Payer: No Typology Code available for payment source | Admitting: Family Medicine

## 2020-11-16 ENCOUNTER — Other Ambulatory Visit (HOSPITAL_COMMUNITY): Payer: Self-pay

## 2020-11-24 ENCOUNTER — Other Ambulatory Visit (HOSPITAL_COMMUNITY): Payer: Self-pay

## 2020-11-24 ENCOUNTER — Other Ambulatory Visit (HOSPITAL_BASED_OUTPATIENT_CLINIC_OR_DEPARTMENT_OTHER): Payer: Self-pay

## 2020-11-25 ENCOUNTER — Ambulatory Visit (INDEPENDENT_AMBULATORY_CARE_PROVIDER_SITE_OTHER): Payer: No Typology Code available for payment source | Admitting: Family Medicine

## 2020-11-25 ENCOUNTER — Encounter: Payer: Self-pay | Admitting: Family Medicine

## 2020-11-25 ENCOUNTER — Other Ambulatory Visit (HOSPITAL_COMMUNITY): Payer: Self-pay

## 2020-11-25 ENCOUNTER — Other Ambulatory Visit (HOSPITAL_BASED_OUTPATIENT_CLINIC_OR_DEPARTMENT_OTHER): Payer: Self-pay

## 2020-11-25 VITALS — BP 128/80 | HR 79 | Temp 98.4°F | Ht 66.0 in | Wt 182.4 lb

## 2020-11-25 DIAGNOSIS — N924 Excessive bleeding in the premenopausal period: Secondary | ICD-10-CM

## 2020-11-25 DIAGNOSIS — N920 Excessive and frequent menstruation with regular cycle: Secondary | ICD-10-CM

## 2020-11-25 DIAGNOSIS — F4322 Adjustment disorder with anxiety: Secondary | ICD-10-CM | POA: Diagnosis not present

## 2020-11-25 LAB — CBC WITH DIFFERENTIAL/PLATELET
Basophils Absolute: 0 10*3/uL (ref 0.0–0.1)
Basophils Relative: 0.6 % (ref 0.0–3.0)
Eosinophils Absolute: 0.1 10*3/uL (ref 0.0–0.7)
Eosinophils Relative: 1.5 % (ref 0.0–5.0)
HCT: 35.3 % — ABNORMAL LOW (ref 36.0–46.0)
Hemoglobin: 11.7 g/dL — ABNORMAL LOW (ref 12.0–15.0)
Lymphocytes Relative: 51.3 % — ABNORMAL HIGH (ref 12.0–46.0)
Lymphs Abs: 2.4 10*3/uL (ref 0.7–4.0)
MCHC: 33.1 g/dL (ref 30.0–36.0)
MCV: 79.2 fl (ref 78.0–100.0)
Monocytes Absolute: 0.4 10*3/uL (ref 0.1–1.0)
Monocytes Relative: 8.4 % (ref 3.0–12.0)
Neutro Abs: 1.8 10*3/uL (ref 1.4–7.7)
Neutrophils Relative %: 38.2 % — ABNORMAL LOW (ref 43.0–77.0)
Platelets: 307 10*3/uL (ref 150.0–400.0)
RBC: 4.45 Mil/uL (ref 3.87–5.11)
RDW: 14.7 % (ref 11.5–15.5)
WBC: 4.6 10*3/uL (ref 4.0–10.5)

## 2020-11-25 LAB — FOLLICLE STIMULATING HORMONE: FSH: 21.1 m[IU]/mL

## 2020-11-25 LAB — IBC + FERRITIN
Ferritin: 6.7 ng/mL — ABNORMAL LOW (ref 10.0–291.0)
Iron: 40 ug/dL — ABNORMAL LOW (ref 42–145)
Saturation Ratios: 8.8 % — ABNORMAL LOW (ref 20.0–50.0)
TIBC: 452.2 ug/dL — ABNORMAL HIGH (ref 250.0–450.0)
Transferrin: 323 mg/dL (ref 212.0–360.0)

## 2020-11-25 MED ORDER — SERTRALINE HCL 25 MG PO TABS
ORAL_TABLET | ORAL | 1 refills | Status: DC
  Filled 2020-11-25: qty 42, 28d supply, fill #0
  Filled 2020-12-20: qty 42, 28d supply, fill #1

## 2020-11-25 NOTE — Progress Notes (Signed)
Subjective  CC:  Chief Complaint  Patient presents with   Menstrual Problem    Cycle Lingering began on 11/12/2020, started off light. Has been heavy past 8 days with clots   Anxiety    HPI: Rachel [name] is a 45 y.o. female who presents to the office today to address the problems listed above in the chief complaint. Heavy vaginal bleeding. Skipped cycle last month, now with almost 10 days of bleeding. Finally tapering off. Some cramping but no significant pain. Prior, had regular cycles at times heavy. Had menorrhagia in 2017; reviewed notes and transvag US. No fibroids and normal endometrium at that time. Had single ovarian cyst. Pt is overdue for pap smear. She reports adverse mood effects from OCPs x 2 in past. Is skidish about exams and procedures.  No f/c/a. No vaginal discharge. No risk of STDs Anxiety continues to worsen. We have discussed mgt before. Likely GAD or stress reaction. Now negatively affecting qol. No panic attacks. No sig depressive sxs. Had been on lexapro many many years ago; took for a year but doesn't feel it worked well for her.  Ros: no cp, sob, doe, palpitations, edema or fatigue  Assessment  1. Menorrhagia with regular cycle   2. Abnormal perimenopausal bleeding   3. Adjustment reaction with anxiety      Plan  DUB:  possible perimenopausal. Discussed differential, diagnostic and treatment options. Pt does not want further investigation and would like to monitor bleeding pattern for now. She is hemodynamically stable. Bleeding has almost stopped spontaneously. Will get lab work, start iron and monitor. Discussed would like to get an ultrasound and pap smear but pt defers both for now. Discussed ocps, mirena, endometrial ablation as therapies if benign perimenopausal dub persists. Pt to consider further intervention if needed. Will monitor and recheck in 6-12 weeks.  Anxiety reaction: possible gad. Start zoloft education and expectations given. Recheck 6 weeks.    Follow up: as scheduled.  12/07/2020  Orders Placed This Encounter  Procedures   CBC with Differential/Platelet   Follicle stimulating hormone   Iron, TIBC and Ferritin Panel   IBC + Ferritin   Meds ordered this encounter  Medications   sertraline (ZOLOFT) 25 MG tablet    Sig: Take 1 tablet (25 mg total) by mouth daily for 14 days, THEN 2 tablets (50 mg total) daily for 14 days.    Dispense:  42 tablet    Refill:  1      I reviewed the patients updated PMH, FH, and SocHx.    Patient Active Problem List   Diagnosis Date Noted   Myalgia due to statin 09/02/2020   Controlled type 2 diabetes mellitus without complication, without long-term current use of insulin ([place]) 08/10/2020   GERD (gastroesophageal reflux disease) 78/24/2353   History of Helicobacter pylori infection 08/10/2020   History of peptic ulcer 08/10/2020   Hyperlipidemia associated with type 2 diabetes mellitus ([place]) 03/29/2020   Adjustment reaction with anxiety 06/26/2017   Chronic migraine 12/28/2016   Insomnia 11/01/2015   Orthostatic hypotension 08/17/2015   Familial hyperlipidemia 08/17/2015   Current Meds  Medication Sig   ALPRAZolam (XANAX) 0.25 MG tablet Take 1 tablet (0.25 mg total) by mouth daily as needed for anxiety.   Evolocumab (REPATHA SURECLICK) 614 MG/ML SOAJ Inject 1 pen into the skin every 14 (fourteen) days.   ezetimibe (ZETIA) 10 MG tablet Take 1 tablet (10 mg total) by mouth daily.   fludrocortisone (FLORINEF) 0.1 MG tablet TAKE 1  TABLET (0.1 MG TOTAL) BY MOUTH DAILY. (Patient taking differently: as needed.)   fluticasone (FLONASE) 50 MCG/ACT nasal spray Place 1 spray into both nostrils 2 (two) times daily. (Patient taking differently: Place 1 spray into both nostrils as needed.)   glucose blood (FREESTYLE LITE) test strip Use once daily to check blood sugar R73.03   glucose monitoring kit (FREESTYLE) monitoring kit 1 each by Does not apply route daily.   Lancets MISC Use to check blood  sugars daily. Please provide brand covered by insurance Dx code: R73.03   meclizine (ANTIVERT) 25 MG tablet Take 1 tablet (25 mg total) by mouth 3 (three) times daily as needed for dizziness.   metFORMIN (GLUCOPHAGE) 850 MG tablet TAKE 1 TABLET (850 MG TOTAL) BY MOUTH 2 (TWO) TIMES DAILY WITH A MEAL.   Multiple Vitamin (MULTIVITAMIN WITH MINERALS) TABS tablet Take 1 tablet by mouth daily.   pantoprazole (PROTONIX) 20 MG tablet Take 1 tablet (20 mg total) by mouth 2 (two) times daily.   pantoprazole (PROTONIX) 20 MG tablet Take 1 tablet (20 mg total) by mouth 2 (two) times daily.   rosuvastatin (CRESTOR) 5 MG tablet Take 1 tablet (5 mg total) by mouth daily.   sertraline (ZOLOFT) 25 MG tablet Take 1 tablet (25 mg total) by mouth daily for 14 days, THEN 2 tablets (50 mg total) daily for 14 days.    Allergies: Patient has No Known Allergies. Family History: Patient family history includes Breast cancer in her paternal aunt; Heart disease in her sister; Hyperlipidemia in her daughter, mother, sister, and another family member; Spina bifida in her son. Social History:  Patient  reports that she has never smoked. She has never used smokeless tobacco. She reports that she does not drink alcohol and does not use drugs.  Review of Systems: Constitutional: Negative for fever malaise or anorexia Cardiovascular: negative for chest pain Respiratory: negative for SOB or persistent cough Gastrointestinal: negative for abdominal pain  Objective  Vitals: BP 128/80   Pulse 79   Temp 98.4 F (36.9 C) (Temporal)   Ht _0  (1.676 m)   Wt 182 lb 6.4 oz (82.7 kg)   LMP 11/12/2020   SpO2 96%   BMI 29.44 kg/m  General: no acute distress , A&Ox3 HEENT: PEERL, conjunctiva normal, neck is supple Cardiovascular:  RRR without murmur or gallop. No edema Respiratory:  Good breath sounds bilaterally, CTAB with normal respiratory effort Abdomen is benign w/o palpable uterus Skin:  Warm, no  rashes    Commons side effects, risks, benefits, and alternatives for medications and treatment plan prescribed today were discussed, and the patient expressed understanding of the given instructions. Patient is instructed to call or message via MyChart if he/she has any questions or concerns regarding our treatment plan. No barriers to understanding were identified. We discussed Red Flag symptoms and signs in detail. Patient expressed understanding regarding what to do in case of urgent or emergency type symptoms.  Medication list was reconciled, printed and provided to the patient in AVS. Patient instructions and summary information was reviewed with the patient as documented in the AVS. This note was prepared with assistance of Dragon voice recognition software. Occasional wrong-word or sound-a-like substitutions may have occurred due to the inherent limitations of voice recognition software  This visit occurred during the SARS-CoV-2 public health emergency.  Safety protocols were in place, including screening questions prior to the visit, additional usage of staff PPE, and extensive cleaning of exam room while observing appropriate contact  time as indicated for disinfecting solutions.    

## 2020-11-25 NOTE — Patient Instructions (Signed)
Please follow up as scheduled for your next visit with me: 12/07/2020   I will release your lab results to you on your MyChart account with further instructions. Please reply with any questions.   If you have any questions or concerns, please don't hesitate to send me a message via MyChart or call the office at [phone]. Thank you for visiting with Rachel [name] today! It's our pleasure caring for you.   Dysfunctional Uterine Bleeding Dysfunctional uterine bleeding is abnormal bleeding from the uterus. Dysfunctional uterine bleeding includes: A menstrual period that comes earlier or later than usual. A menstrual period that is lighter or heavier than usual, or has large blood clots. Vaginal bleeding between menstrual periods. Skipping one or more menstrual periods. Vaginal bleeding after sex. Vaginal bleeding after menopause. Follow these instructions at home: Eating and drinking  Eat well-balanced meals. Include foods that are high in iron, such as liver, meat, shellfish, green leafy vegetables, and eggs. To prevent or treat constipation, your health care provider may recommend that you: Drink enough fluid to keep your urine pale yellow. Take over-the-counter or prescription medicines. Eat foods that are high in fiber, such as beans, whole grains, and fresh fruits and vegetables. Limit foods that are high in fat and processed sugars, such as fried or sweet foods. Medicines Take over-the-counter and prescription medicines only as told by your health care provider. Do not change medicines without talking with your health care provider. Aspirin or medicines that contain aspirin may make the bleeding worse. Do not take those medicines: During the week before your menstrual period. During your menstrual period. If you were prescribed iron pills, take them as told by your health care provider. Iron pills help to replace iron that your body loses because of this condition. Activity If you need to  change your sanitary pad or tampon more than one time every 2 hours: Lie in bed with your feet raised (elevated). Place a cold pack on your lower abdomen. Rest as much as possible until the bleeding stops or slows down. Do not try to lose weight until the bleeding has stopped and your blood iron level is back to normal. General instructions  For two months, write down: When your menstrual period starts. When your menstrual period ends. When any abnormal vaginal bleeding occurs. What problems you notice. Keep all follow up visits as told by your health care provider. This is important. Contact a health care provider if you: Feel light-headed or weak. Have nausea and vomiting. Cannot eat or drink without vomiting. Feel dizzy or have diarrhea while you are taking medicines. Are taking birth control pills or hormones, and you want to change them or stop taking them. Get help right away if: You develop a fever or chills. You need to change your sanitary pad or tampon more than one time per hour. Your vaginal bleeding becomes heavier, or your flow contains clots more often. You develop pain in your abdomen. You lose consciousness. You develop a rash. Summary Dysfunctional uterine bleeding is abnormal bleeding from the uterus. It includes menstrual bleeding of abnormal duration, volume, or regularity. Bleeding after sex and after menopause are also considered dysfunctional uterine bleeding. This information is not intended to replace advice given to you by your health care provider. Make sure you discuss any questions you have with your health care provider. Document Revised: 08/14/2017 Document Reviewed: 08/14/2017 Elsevier Patient Education  2022 [employer].

## 2020-12-07 ENCOUNTER — Encounter: Payer: Self-pay | Admitting: Family Medicine

## 2020-12-07 ENCOUNTER — Other Ambulatory Visit: Payer: Self-pay

## 2020-12-07 ENCOUNTER — Other Ambulatory Visit (HOSPITAL_BASED_OUTPATIENT_CLINIC_OR_DEPARTMENT_OTHER): Payer: Self-pay

## 2020-12-07 ENCOUNTER — Ambulatory Visit (INDEPENDENT_AMBULATORY_CARE_PROVIDER_SITE_OTHER): Payer: No Typology Code available for payment source | Admitting: Family Medicine

## 2020-12-07 VITALS — BP 124/78 | HR 73 | Ht 66.0 in | Wt 177.0 lb

## 2020-12-07 DIAGNOSIS — E7849 Other hyperlipidemia: Secondary | ICD-10-CM

## 2020-12-07 DIAGNOSIS — N920 Excessive and frequent menstruation with regular cycle: Secondary | ICD-10-CM | POA: Insufficient documentation

## 2020-12-07 DIAGNOSIS — E785 Hyperlipidemia, unspecified: Secondary | ICD-10-CM

## 2020-12-07 DIAGNOSIS — G43009 Migraine without aura, not intractable, without status migrainosus: Secondary | ICD-10-CM

## 2020-12-07 DIAGNOSIS — F5104 Psychophysiologic insomnia: Secondary | ICD-10-CM

## 2020-12-07 DIAGNOSIS — E1169 Type 2 diabetes mellitus with other specified complication: Secondary | ICD-10-CM

## 2020-12-07 DIAGNOSIS — E119 Type 2 diabetes mellitus without complications: Secondary | ICD-10-CM

## 2020-12-07 DIAGNOSIS — F4322 Adjustment disorder with anxiety: Secondary | ICD-10-CM | POA: Diagnosis not present

## 2020-12-07 DIAGNOSIS — K219 Gastro-esophageal reflux disease without esophagitis: Secondary | ICD-10-CM

## 2020-12-07 HISTORY — DX: Migraine without aura, not intractable, without status migrainosus: G43.009

## 2020-12-07 HISTORY — DX: Excessive and frequent menstruation with regular cycle: N92.0

## 2020-12-07 LAB — POCT GLYCOSYLATED HEMOGLOBIN (HGB A1C): Hemoglobin A1C: 6.3 % — AB (ref 4.0–5.6)

## 2020-12-07 MED ORDER — POLYSACCHARIDE IRON COMPLEX 150 MG PO CAPS
150.0000 mg | ORAL_CAPSULE | Freq: Every day | ORAL | 2 refills | Status: DC
  Filled 2020-12-07: qty 60, 60d supply, fill #0

## 2020-12-07 MED ORDER — RIZATRIPTAN BENZOATE 10 MG PO TBDP
10.0000 mg | ORAL_TABLET | ORAL | 2 refills | Status: AC | PRN
  Filled 2020-12-07: qty 10, 30d supply, fill #0

## 2020-12-07 NOTE — Patient Instructions (Signed)
Please return in 6-12 weeks for recheck on mood and anemia  If you have any questions or concerns, please don't hesitate to send me a message via MyChart or call the office at [phone]. Thank you for visiting with [place] today! It's our pleasure caring for you.

## 2020-12-07 NOTE — Progress Notes (Signed)
Subjective  CC:  Chief Complaint  Patient presents with   Diabetes   Hypertension   Hyperlipidemia    HPI: Rachel [name] is a 45 y.o. female who presents to the office today for follow up of diabetes and problems listed above in the chief complaint.  Diabetes follow up: Her diabetic control is reported as Improved. Tolerating metformin er 850 bid. Eating better.  She denies exertional CP or SOB or symptomatic hypoglycemia. She denies foot sores or paresthesias.  HLD, familial: reviewed cards and lipid clinic notes. On repatha, low dose crestor and zetia. Tolerating it. Due for recheck but not fasting today.  Stress reaction/GAD: 2 weeks on zoloft and tolerating it. ? Affecting sleep - takes with dinner. Has had poor sleep prior though. No other Aes. Mood is stablilizing Menorrahgia w/ regular cycle. Bleeding stopped spontaneously. Monitoring, declined further eval now. Due pap Anemia, iron deficiency; started iron. Constipated.  Migraines classic about once monthly; also with tension headaches chronically  Wt Readings from Last 3 Encounters:  12/07/20 177 lb (80.3 kg)  11/25/20 182 lb 6.4 oz (82.7 kg)  09/02/20 181 lb 6.4 oz (82.3 kg)    BP Readings from Last 3 Encounters:  12/07/20 124/78  11/25/20 128/80  09/02/20 119/80    Assessment  1. Controlled type 2 diabetes mellitus without complication, without long-term current use of insulin (HCC)   2. Familial hyperlipidemia   3. Hyperlipidemia associated with type 2 diabetes mellitus (HCC)   4. Adjustment reaction with anxiety   5. Menorrhagia with regular cycle   6. Gastroesophageal reflux disease, unspecified whether esophagitis present   7. Psychophysiological insomnia   8. Migraine without aura, not intractable, without status migrainosus      Plan  Diabetes is currently very well controlled. Continue metformin HLD: repatha, zetia and low dose crestor: recheck fasting this or next week  GAD/stress: continue and  titrate up zoloft. Recheck 6 weeks. Counseling done GERD is now controlled.  Migraines: trial of maxalt.  Melatonin for sleep  Follow up: 6 weeks to recheck mood. Orders Placed This Encounter  Procedures   POCT HgB A1C   Meds ordered this encounter  Medications   rizatriptan (MAXALT-MLT) 10 MG disintegrating tablet    Sig: Take 1 tablet (10 mg total) by mouth as needed for migraine. May repeat in 2 hours if needed    Dispense:  10 tablet    Refill:  2   iron polysaccharides (NIFEREX) 150 MG capsule    Sig: Take 1 capsule (150 mg total) by mouth daily.    Dispense:  60 capsule    Refill:  2      Immunization History  Administered Date(s) Administered   Influenza,inj,Quad PF,6+ Mos 12/27/2019   Influenza-Unspecified 12/13/2015, 12/06/2017, 12/09/2018   PFIZER(Purple Top)SARS-COV-2 Vaccination 08/24/2019, 09/12/2019    Diabetes Related Lab Review: Lab Results  Component Value Date   HGBA1C 6.3 (A) 12/07/2020   HGBA1C 6.3 (A) 08/10/2020   HGBA1C 6.2 03/21/2020    Lab Results  Component Value Date   MICROALBUR 0.8 08/10/2020   Lab Results  Component Value Date   CREATININE 0.47 07/29/2020   BUN 7 07/29/2020   NA 137 07/29/2020   K 4.0 07/29/2020   CL 102 07/29/2020   CO2 27 07/29/2020   Lab Results  Component Value Date   CHOL 184 08/10/2020   CHOL 249 (H) 03/21/2020   CHOL 225 (H) 11/26/2019   Lab Results  Component Value Date   HDL 44.70  08/10/2020   HDL 47.50 03/21/2020   HDL 55 11/26/2019   Lab Results  Component Value Date   LDLCALC 111 (H) 08/10/2020   LDLCALC 164 (H) 03/21/2020   LDLCALC 143 (H) 11/26/2019   Lab Results  Component Value Date   TRIG 142.0 08/10/2020   TRIG 184.0 (H) 03/21/2020   TRIG 134 11/26/2019   Lab Results  Component Value Date   CHOLHDL 4 08/10/2020   CHOLHDL 5 03/21/2020   CHOLHDL 4.1 11/26/2019   No results found for: LDLDIRECT The 10-year ASCVD risk score (Arnett DK, et al., 2019) is: 1.7%   Values used to  calculate the score:     Age: 89 years     Sex: Female     Is Non-Hispanic African American: No     Diabetic: Yes     Tobacco smoker: No     Systolic Blood Pressure: 124 mmHg     Is BP treated: No     HDL Cholesterol: 44.7 mg/dL     Total Cholesterol: 184 mg/dL I have reviewed the PMH, Fam and Soc history. Patient Active Problem List   Diagnosis Date Noted   Controlled type 2 diabetes mellitus without complication, without long-term current use of insulin (HCC) 08/10/2020    Priority: High   Hyperlipidemia associated with type 2 diabetes mellitus ([facility]CC) 03/29/2020    Priority: High   Familial hyperlipidemia 08/17/2015    Priority: High   GERD (gastroesophageal reflux disease) 08/10/2020    Priority: Medium   History of Helicobacter pylori infection 08/10/2020    Priority: Medium   History of peptic ulcer 08/10/2020    Priority: Medium   Adjustment reaction with anxiety 06/26/2017    Priority: Medium   Chronic migraine 12/28/2016    Priority: Medium    Referred to neurology. Did not tolerate topiramate.    Insomnia 11/01/2015    Priority: Medium   Orthostatic hypotension 08/17/2015    Priority: Medium   Menorrhagia with regular cycle 12/07/2020   Migraine without aura, not intractable, without status migrainosus 12/07/2020   Myalgia due to statin 09/02/2020    Social History: Patient  reports that she has never smoked. She has never used smokeless tobacco. She reports that she does not drink alcohol and does not use drugs.  Review of Systems: Ophthalmic: negative for eye pain, loss of vision or double vision Cardiovascular: negative for chest pain Respiratory: negative for SOB or persistent cough Gastrointestinal: negative for abdominal pain Genitourinary: negative for dysuria or gross hematuria MSK: negative for foot lesions Neurologic: negative for weakness or gait disturbance  Objective  Vitals: BP 124/78   Pulse 73   Ht 5\' 6"  (1.676 m)   Wt 177 lb (80.3 kg)    LMP 11/12/2020   SpO2 97%   BMI 28.57 kg/m  General: well appearing, no acute distress  Psych:  Alert and oriented, normal mood and affect    Diabetic education: ongoing education regarding chronic disease management for diabetes was given today. We continue to reinforce the ABC's of diabetic management: A1c (<7 or 8 dependent upon patient), tight blood pressure control, and cholesterol management with goal LDL < 100 minimally. We discuss diet strategies, exercise recommendations, medication options and possible side effects. At each visit, we review recommended immunizations and preventive care recommendations for diabetics and stress that good diabetic control can prevent other problems. See below for this patient's data.   Commons side effects, risks, benefits, and alternatives for medications and treatment plan prescribed today  were discussed, and the patient expressed understanding of the given instructions. Patient is instructed to call or message via MyChart if he/she has any questions or concerns regarding our treatment plan. No barriers to understanding were identified. We discussed Red Flag symptoms and signs in detail. Patient expressed understanding regarding what to do in case of urgent or emergency type symptoms.  Medication list was reconciled, printed and provided to the patient in AVS. Patient instructions and summary information was reviewed with the patient as documented in the AVS. This note was prepared with assistance of Dragon voice recognition software. Occasional wrong-word or sound-a-like substitutions may have occurred due to the inherent limitations of voice recognition software  This visit occurred during the SARS-CoV-2 public health emergency.  Safety protocols were in place, including screening questions prior to the visit, additional usage of staff PPE, and extensive cleaning of exam room while observing appropriate contact time as indicated for disinfecting  solutions.

## 2020-12-20 ENCOUNTER — Other Ambulatory Visit: Payer: No Typology Code available for payment source

## 2020-12-21 ENCOUNTER — Other Ambulatory Visit (INDEPENDENT_AMBULATORY_CARE_PROVIDER_SITE_OTHER): Payer: No Typology Code available for payment source

## 2020-12-21 ENCOUNTER — Other Ambulatory Visit: Payer: Self-pay

## 2020-12-21 ENCOUNTER — Other Ambulatory Visit (HOSPITAL_BASED_OUTPATIENT_CLINIC_OR_DEPARTMENT_OTHER): Payer: Self-pay

## 2020-12-21 DIAGNOSIS — E7849 Other hyperlipidemia: Secondary | ICD-10-CM | POA: Diagnosis not present

## 2020-12-21 LAB — LIPID PANEL
Cholesterol: 138 mg/dL (ref 0–200)
HDL: 53.1 mg/dL (ref >39.00)
LDL Cholesterol: 63 mg/dL (ref 0–99)
NonHDL: 85.01
Total CHOL/HDL Ratio: 3
Triglycerides: 109 mg/dL (ref 0.0–149.0)
VLDL: 21.8 mg/dL (ref 0.0–40.0)

## 2020-12-21 LAB — HEPATIC FUNCTION PANEL
ALT: 8 U/L (ref 0–35)
AST: 12 U/L (ref 0–37)
Albumin: 4.3 g/dL (ref 3.5–5.2)
Alkaline Phosphatase: 56 U/L (ref 39–117)
Bilirubin, Direct: 0.1 mg/dL (ref 0.0–0.3)
Total Bilirubin: 0.8 mg/dL (ref 0.2–1.2)
Total Protein: 7.3 g/dL (ref 6.0–8.3)

## 2020-12-21 NOTE — Addendum Note (Signed)
Addended by: Laddie Aquas A on: 12/21/2020 07:56 AM   Modules accepted: Orders

## 2020-12-27 ENCOUNTER — Other Ambulatory Visit: Payer: Self-pay

## 2020-12-27 ENCOUNTER — Other Ambulatory Visit (HOSPITAL_BASED_OUTPATIENT_CLINIC_OR_DEPARTMENT_OTHER): Payer: Self-pay

## 2020-12-27 MED ORDER — SERTRALINE HCL 25 MG PO TABS
ORAL_TABLET | ORAL | 3 refills | Status: DC
  Filled 2020-12-27: qty 60, fill #0
  Filled 2021-01-12: qty 60, 30d supply, fill #0

## 2021-01-10 ENCOUNTER — Other Ambulatory Visit (HOSPITAL_BASED_OUTPATIENT_CLINIC_OR_DEPARTMENT_OTHER): Payer: Self-pay

## 2021-01-10 ENCOUNTER — Other Ambulatory Visit: Payer: Self-pay | Admitting: Family Medicine

## 2021-01-10 DIAGNOSIS — S0300XA Dislocation of jaw, unspecified side, initial encounter: Secondary | ICD-10-CM | POA: Insufficient documentation

## 2021-01-10 MED ORDER — CYCLOBENZAPRINE HCL 10 MG PO TABS
10.0000 mg | ORAL_TABLET | Freq: Every evening | ORAL | 2 refills | Status: DC | PRN
  Filled 2021-01-10: qty 30, 30d supply, fill #0

## 2021-01-13 ENCOUNTER — Other Ambulatory Visit (HOSPITAL_BASED_OUTPATIENT_CLINIC_OR_DEPARTMENT_OTHER): Payer: Self-pay

## 2021-01-16 ENCOUNTER — Other Ambulatory Visit (HOSPITAL_BASED_OUTPATIENT_CLINIC_OR_DEPARTMENT_OTHER): Payer: Self-pay

## 2021-01-23 ENCOUNTER — Other Ambulatory Visit (HOSPITAL_BASED_OUTPATIENT_CLINIC_OR_DEPARTMENT_OTHER): Payer: Self-pay

## 2021-01-30 ENCOUNTER — Other Ambulatory Visit (HOSPITAL_BASED_OUTPATIENT_CLINIC_OR_DEPARTMENT_OTHER): Payer: Self-pay

## 2021-01-30 ENCOUNTER — Encounter: Payer: Self-pay | Admitting: Family Medicine

## 2021-01-30 MED ORDER — NORETHINDRONE-ETH ESTRADIOL 1-35 MG-MCG PO TABS
1.0000 | ORAL_TABLET | Freq: Every day | ORAL | 11 refills | Status: DC
  Filled 2021-01-30: qty 84, 84d supply, fill #0

## 2021-01-31 ENCOUNTER — Encounter: Payer: Self-pay | Admitting: Family Medicine

## 2021-01-31 ENCOUNTER — Other Ambulatory Visit (HOSPITAL_BASED_OUTPATIENT_CLINIC_OR_DEPARTMENT_OTHER): Payer: Self-pay

## 2021-01-31 ENCOUNTER — Ambulatory Visit (INDEPENDENT_AMBULATORY_CARE_PROVIDER_SITE_OTHER): Payer: No Typology Code available for payment source | Admitting: Family Medicine

## 2021-01-31 VITALS — BP 124/78 | HR 89 | Temp 98.0°F | Wt 180.2 lb

## 2021-01-31 DIAGNOSIS — D500 Iron deficiency anemia secondary to blood loss (chronic): Secondary | ICD-10-CM | POA: Diagnosis not present

## 2021-01-31 DIAGNOSIS — F4322 Adjustment disorder with anxiety: Secondary | ICD-10-CM

## 2021-01-31 DIAGNOSIS — N920 Excessive and frequent menstruation with regular cycle: Secondary | ICD-10-CM | POA: Diagnosis not present

## 2021-01-31 LAB — CBC WITH DIFFERENTIAL/PLATELET
Basophils Absolute: 0 10*3/uL (ref 0.0–0.1)
Basophils Relative: 0.6 % (ref 0.0–3.0)
Eosinophils Absolute: 0.1 10*3/uL (ref 0.0–0.7)
Eosinophils Relative: 1.6 % (ref 0.0–5.0)
HCT: 35.9 % — ABNORMAL LOW (ref 36.0–46.0)
Hemoglobin: 11.9 g/dL — ABNORMAL LOW (ref 12.0–15.0)
Lymphocytes Relative: 48.7 % — ABNORMAL HIGH (ref 12.0–46.0)
Lymphs Abs: 2.5 10*3/uL (ref 0.7–4.0)
MCHC: 33.1 g/dL (ref 30.0–36.0)
MCV: 78.6 fl (ref 78.0–100.0)
Monocytes Absolute: 0.3 10*3/uL (ref 0.1–1.0)
Monocytes Relative: 6.5 % (ref 3.0–12.0)
Neutro Abs: 2.1 10*3/uL (ref 1.4–7.7)
Neutrophils Relative %: 42.6 % — ABNORMAL LOW (ref 43.0–77.0)
Platelets: 317 10*3/uL (ref 150.0–400.0)
RBC: 4.57 Mil/uL (ref 3.87–5.11)
RDW: 14.6 % (ref 11.5–15.5)
WBC: 5 10*3/uL (ref 4.0–10.5)

## 2021-01-31 MED ORDER — MEDROXYPROGESTERONE ACETATE 10 MG PO TABS
10.0000 mg | ORAL_TABLET | Freq: Every day | ORAL | 0 refills | Status: DC
  Filled 2021-01-31: qty 10, 10d supply, fill #0

## 2021-01-31 MED ORDER — SERTRALINE HCL 50 MG PO TABS
50.0000 mg | ORAL_TABLET | Freq: Every day | ORAL | 3 refills | Status: DC
  Filled 2021-01-31: qty 90, 90d supply, fill #0

## 2021-01-31 NOTE — Progress Notes (Signed)
Subjective  CC:  Chief Complaint  Patient presents with   Anxiety    Is controlled now, zoloft has helped   Anemia    HPI: Rachel [name] is a 45 y.o. female who presents to the office today to address the problems listed above in the chief complaint. Follow-up anxiety: See last note.  Now on Zoloft 50 mg daily.  She feels this is very helpful.  Anxiety is much improved.  Less worry.  No more panic symptoms.  Rest is still a problem given her demands at home at nighttime to caring for her son.  Work remains stressful.  No adverse effects from Zoloft. Very worried about upcoming trip however, will be traveling to [place] later this week for a special retreat.  She is due for her menstrual cycle.  LMP was October 25.  She is due next week.  However she would not be able to attend this virtual retreat if she is on her menstrual cycle.  She did have some menorrhagia back in September.  Fortunately her last menstrual cycle was normal.  She would like to know if there is any way to postpone her bleeding.  She would also like to start birth control pills to regulate her cycles when she returns.  She is not having pain.  She has not had a pelvic ultrasound. Iron deficiency anemia on iron daily.  Due for recheck  Assessment  1. Menorrhagia with regular cycle   2. Iron deficiency anemia due to chronic blood loss   3. Adjustment reaction with anxiety      Plan  Menorrhagia and iron deficiency anemia: Counseling done.  Difficult to manage bleeding short-term but we could consider Provera 10 mg daily for 10 days starting on Friday.  When she returns, start OCPs.  Check iron levels today and CBC.  Would like to get Pap smear and pelvic ultrasound done when she returns. Anxiety, adjustment reaction: Much improved on Zoloft.  Continue 50 mg daily.  Refilled  Follow up: No follow-ups on file.  Visit date not found  Orders Placed This Encounter  Procedures   CBC with Differential/Platelet   IBC +  Ferritin   Meds ordered this encounter  Medications   sertraline (ZOLOFT) 50 MG tablet    Sig: Take 1 tablet (50 mg total) by mouth daily.    Dispense:  90 tablet    Refill:  3      I reviewed the patients updated PMH, FH, and SocHx.    Patient Active Problem List   Diagnosis Date Noted   Controlled type 2 diabetes mellitus without complication, without long-term current use of insulin ([place]) 08/10/2020    Priority: High   Hyperlipidemia associated with type 2 diabetes mellitus ([place]) 03/29/2020    Priority: High   Familial hyperlipidemia 08/17/2015    Priority: High   GERD (gastroesophageal reflux disease) 08/10/2020    Priority: Medium    History of Helicobacter pylori infection 08/10/2020    Priority: Medium    History of peptic ulcer 08/10/2020    Priority: Medium    Adjustment reaction with anxiety 06/26/2017    Priority: Medium    Chronic migraine 12/28/2016    Priority: Medium    Insomnia 11/01/2015    Priority: Medium    Orthostatic hypotension 08/17/2015    Priority: Medium    TMJ (dislocation of temporomandibular joint) 01/10/2021   Menorrhagia with regular cycle 12/07/2020   Migraine without aura, not intractable, without status migrainosus 12/07/2020  Myalgia due to statin 09/02/2020   Current Meds  Medication Sig   ALPRAZolam (XANAX) 0.25 MG tablet Take 1 tablet (0.25 mg total) by mouth daily as needed for anxiety.   cyclobenzaprine (FLEXERIL) 10 MG tablet Take 1 tablet (10 mg total) by mouth at bedtime as needed (TMJ).   Evolocumab (REPATHA SURECLICK) 213 MG/ML SOAJ Inject 1 pen into the skin every 14 (fourteen) days.   ezetimibe (ZETIA) 10 MG tablet Take 1 tablet (10 mg total) by mouth daily.   fluticasone (FLONASE) 50 MCG/ACT nasal spray Place 1 spray into both nostrils 2 (two) times daily. (Patient taking differently: Place 1 spray into both nostrils as needed.)   glucose blood (FREESTYLE LITE) test strip Use once daily to check blood sugar R73.03    glucose monitoring kit (FREESTYLE) monitoring kit 1 each by Does not apply route daily.   iron polysaccharides (NIFEREX) 150 MG capsule Take 1 capsule (150 mg total) by mouth daily.   Lancets MISC Use to check blood sugars daily. Please provide brand covered by insurance Dx code: R73.03   meclizine (ANTIVERT) 25 MG tablet Take 1 tablet (25 mg total) by mouth 3 (three) times daily as needed for dizziness.   metFORMIN (GLUCOPHAGE) 850 MG tablet TAKE 1 TABLET (850 MG TOTAL) BY MOUTH 2 (TWO) TIMES DAILY WITH A MEAL.   Multiple Vitamin (MULTIVITAMIN WITH MINERALS) TABS tablet Take 1 tablet by mouth daily.   norethindrone-ethinyl estradiol 1/35 (ORTHO-NOVUM) tablet Take 1 tablet by mouth daily.   pantoprazole (PROTONIX) 20 MG tablet Take 1 tablet (20 mg total) by mouth 2 (two) times daily.   rizatriptan (MAXALT-MLT) 10 MG disintegrating tablet Take 1 tablet (10 mg total) by mouth as needed for migraine. May repeat in 2 hours if needed   rosuvastatin (CRESTOR) 5 MG tablet Take 1 tablet (5 mg total) by mouth daily.   [DISCONTINUED] sertraline (ZOLOFT) 25 MG tablet Take 2 (68m) tablets daily    Allergies: Patient has No Known Allergies. Family History: Patient family history includes Breast cancer in her paternal aunt; Heart disease in her sister; Hyperlipidemia in her daughter, mother, sister, and another family member; Spina bifida in her son. Social History:  Patient  reports that she has never smoked. She has never used smokeless tobacco. She reports that she does not drink alcohol and does not use drugs.  Review of Systems: Constitutional: Negative for fever malaise or anorexia Cardiovascular: negative for chest pain Respiratory: negative for SOB or persistent cough Gastrointestinal: negative for abdominal pain  Objective  Vitals: BP 124/78   Pulse 89   Temp 98 F (36.7 C) (Temporal)   Wt 180 lb 3.2 oz (81.7 kg)   LMP 01/10/2021 (Exact Date)   SpO2 99%   BMI 29.09 kg/m  General: no  acute distress , A&Ox3 Mood: Appears happy, normal affect    Commons side effects, risks, benefits, and alternatives for medications and treatment plan prescribed today were discussed, and the patient expressed understanding of the given instructions. Patient is instructed to call or message via MyChart if he/she has any questions or concerns regarding our treatment plan. No barriers to understanding were identified. We discussed Red Flag symptoms and signs in detail. Patient expressed understanding regarding what to do in case of urgent or emergency type symptoms.  Medication list was reconciled, printed and provided to the patient in AVS. Patient instructions and summary information was reviewed with the patient as documented in the AVS. This note was prepared with assistance of Dragon  voice recognition software. Occasional wrong-word or sound-a-like substitutions may have occurred due to the inherent limitations of voice recognition software  This visit occurred during the SARS-CoV-2 public health emergency.  Safety protocols were in place, including screening questions prior to the visit, additional usage of staff PPE, and extensive cleaning of exam room while observing appropriate contact time as indicated for disinfecting solutions.

## 2021-02-01 ENCOUNTER — Ambulatory Visit: Payer: No Typology Code available for payment source | Admitting: Family Medicine

## 2021-02-01 LAB — IRON,TIBC AND FERRITIN PANEL
%SAT: 9 % (calc) — ABNORMAL LOW (ref 16–45)
Ferritin: 6 ng/mL — ABNORMAL LOW (ref 16–232)
Iron: 38 ug/dL — ABNORMAL LOW (ref 40–190)
TIBC: 434 mcg/dL (calc) (ref 250–450)

## 2021-02-13 ENCOUNTER — Other Ambulatory Visit (HOSPITAL_BASED_OUTPATIENT_CLINIC_OR_DEPARTMENT_OTHER): Payer: Self-pay

## 2021-02-13 ENCOUNTER — Telehealth (INDEPENDENT_AMBULATORY_CARE_PROVIDER_SITE_OTHER): Payer: No Typology Code available for payment source | Admitting: Family Medicine

## 2021-02-13 ENCOUNTER — Encounter: Payer: Self-pay | Admitting: Family Medicine

## 2021-02-13 DIAGNOSIS — J0100 Acute maxillary sinusitis, unspecified: Secondary | ICD-10-CM | POA: Diagnosis not present

## 2021-02-13 MED ORDER — AMOXICILLIN 875 MG PO TABS
875.0000 mg | ORAL_TABLET | Freq: Two times a day (BID) | ORAL | 0 refills | Status: AC
  Filled 2021-02-13: qty 20, 10d supply, fill #0

## 2021-02-13 NOTE — Progress Notes (Signed)
tell me how was it    Virtual Visit via Video Note  Subjective  CC:  Chief Complaint  Patient presents with   Nasal Congestion   Cough    Itchy throat, symptoms since 11/24 negative covid and flu     I connected with Parisa Elko on 02/13/21 at 10:00 AM EST by a video enabled telemedicine application and verified that I am speaking with the correct person using two identifiers. Location patient: Home Location provider: [facility], Office Persons participating in the virtual visit: Keidy Brawley, Leamon Arnt, MD [place]  I discussed the limitations of evaluation and management by telemedicine and the availability of in person appointments. The patient expressed understanding and agreed to proceed. HPI: Rachel [name] is a 45 y.o. female who was contacted today to address the problems listed above in the chief complaint. Returned from [place] recently: week long trip: exposed to multiple sick contacts. Neg covid and flu testing and repeated: c/o sinus pain pressure and green blood tinged discharge w/o fever; mild cough. Fatigue. No sob or cp. No gi sxs.   Assessment  1. Acute non-recurrent maxillary sinusitis      Plan  sinusitis:  discussed viral vs bacterial; trial of amox and supportive care.  I discussed the assessment and treatment plan with the patient. The patient was provided an opportunity to ask questions and all were answered. The patient agreed with the plan and demonstrated an understanding of the instructions.   The patient was advised to call back or seek an in-person evaluation if the symptoms worsen or if the condition fails to improve as anticipated. Follow up: as needed  Visit date not found  Meds ordered this encounter  Medications   amoxicillin (AMOXIL) 875 MG tablet    Sig: Take 1 tablet (875 mg total) by mouth 2 (two) times daily for 10 days.    Dispense:  20 tablet    Refill:  0      I reviewed the  patients updated PMH, FH, and SocHx.    Patient Active Problem List   Diagnosis Date Noted   Controlled type 2 diabetes mellitus without complication, without long-term current use of insulin ([place]) 08/10/2020    Priority: High   Hyperlipidemia associated with type 2 diabetes mellitus ([place]) 03/29/2020    Priority: High   Familial hyperlipidemia 08/17/2015    Priority: High   GERD (gastroesophageal reflux disease) 08/10/2020    Priority: Medium    History of Helicobacter pylori infection 08/10/2020    Priority: Medium    History of peptic ulcer 08/10/2020    Priority: Medium    Adjustment reaction with anxiety 06/26/2017    Priority: Medium    Chronic migraine 12/28/2016    Priority: Medium    Insomnia 11/01/2015    Priority: Medium    Orthostatic hypotension 08/17/2015    Priority: Medium    TMJ (dislocation of temporomandibular joint) 01/10/2021   Menorrhagia with regular cycle 12/07/2020   Migraine without aura, not intractable, without status migrainosus 12/07/2020   Myalgia due to statin 09/02/2020   Current Meds  Medication Sig   ALPRAZolam (XANAX) 0.25 MG tablet Take 1 tablet (0.25 mg total) by mouth daily as needed for anxiety.   amoxicillin (AMOXIL) 875 MG tablet Take 1 tablet (875 mg total) by mouth 2 (two) times daily for 10 days.   cyclobenzaprine (FLEXERIL) 10 MG tablet Take 1 tablet (10 mg total) by mouth at bedtime  as needed (TMJ).   Evolocumab (REPATHA SURECLICK) 790 MG/ML SOAJ Inject 1 pen into the skin every 14 (fourteen) days.   ezetimibe (ZETIA) 10 MG tablet Take 1 tablet (10 mg total) by mouth daily.   fluticasone (FLONASE) 50 MCG/ACT nasal spray Place 1 spray into both nostrils 2 (two) times daily. (Patient taking differently: Place 1 spray into both nostrils as needed.)   glucose blood (FREESTYLE LITE) test strip Use once daily to check blood sugar R73.03   glucose monitoring kit (FREESTYLE) monitoring kit 1 each by Does not apply route daily.   iron  polysaccharides (NIFEREX) 150 MG capsule Take 1 capsule (150 mg total) by mouth daily.   Lancets MISC Use to check blood sugars daily. Please provide brand covered by insurance Dx code: R73.03   meclizine (ANTIVERT) 25 MG tablet Take 1 tablet (25 mg total) by mouth 3 (three) times daily as needed for dizziness.   metFORMIN (GLUCOPHAGE) 850 MG tablet TAKE 1 TABLET (850 MG TOTAL) BY MOUTH 2 (TWO) TIMES DAILY WITH A MEAL.   Multiple Vitamin (MULTIVITAMIN WITH MINERALS) TABS tablet Take 1 tablet by mouth daily.   norethindrone-ethinyl estradiol 1/35 (ORTHO-NOVUM) tablet Take 1 tablet by mouth daily.   pantoprazole (PROTONIX) 20 MG tablet Take 1 tablet (20 mg total) by mouth 2 (two) times daily.   rizatriptan (MAXALT-MLT) 10 MG disintegrating tablet Take 1 tablet (10 mg total) by mouth as needed for migraine. May repeat in 2 hours if needed   rosuvastatin (CRESTOR) 5 MG tablet Take 1 tablet (5 mg total) by mouth daily.   sertraline (ZOLOFT) 50 MG tablet Take 1 tablet (50 mg total) by mouth daily.    Allergies: Patient has No Known Allergies. Family History: Patient family history includes Breast cancer in her paternal aunt; Heart disease in her sister; Hyperlipidemia in her daughter, mother, sister, and another family member; Spina bifida in her son. Social History:  Patient  reports that she has never smoked. She has never used smokeless tobacco. She reports that she does not drink alcohol and does not use drugs.  Review of Systems: Constitutional: Negative for fever malaise or anorexia Cardiovascular: negative for chest pain Respiratory: negative for SOB or persistent cough Gastrointestinal: negative for abdominal pain  OBJECTIVE Vitals: There were no vitals taken for this visit. General: no acute distress , A&Ox3 Points to left maxillary sinus pain  Leamon Arnt, MD

## 2021-02-28 ENCOUNTER — Other Ambulatory Visit (HOSPITAL_BASED_OUTPATIENT_CLINIC_OR_DEPARTMENT_OTHER): Payer: Self-pay

## 2021-03-02 ENCOUNTER — Other Ambulatory Visit: Payer: Self-pay

## 2021-03-02 ENCOUNTER — Other Ambulatory Visit (INDEPENDENT_AMBULATORY_CARE_PROVIDER_SITE_OTHER): Payer: No Typology Code available for payment source

## 2021-03-02 DIAGNOSIS — E7849 Other hyperlipidemia: Secondary | ICD-10-CM

## 2021-03-02 DIAGNOSIS — D500 Iron deficiency anemia secondary to blood loss (chronic): Secondary | ICD-10-CM

## 2021-03-02 DIAGNOSIS — E1169 Type 2 diabetes mellitus with other specified complication: Secondary | ICD-10-CM

## 2021-03-02 LAB — CBC WITH DIFFERENTIAL/PLATELET
Basophils Absolute: 0 10*3/uL (ref 0.0–0.1)
Basophils Relative: 0.8 % (ref 0.0–3.0)
Eosinophils Absolute: 0.1 10*3/uL (ref 0.0–0.7)
Eosinophils Relative: 2 % (ref 0.0–5.0)
HCT: 35.7 % — ABNORMAL LOW (ref 36.0–46.0)
Hemoglobin: 11.7 g/dL — ABNORMAL LOW (ref 12.0–15.0)
Lymphocytes Relative: 48.7 % — ABNORMAL HIGH (ref 12.0–46.0)
Lymphs Abs: 2.6 10*3/uL (ref 0.7–4.0)
MCHC: 32.7 g/dL (ref 30.0–36.0)
MCV: 78.8 fl (ref 78.0–100.0)
Monocytes Absolute: 0.3 10*3/uL (ref 0.1–1.0)
Monocytes Relative: 5.5 % (ref 3.0–12.0)
Neutro Abs: 2.3 10*3/uL (ref 1.4–7.7)
Neutrophils Relative %: 43 % (ref 43.0–77.0)
Platelets: 305 10*3/uL (ref 150.0–400.0)
RBC: 4.53 Mil/uL (ref 3.87–5.11)
RDW: 14.3 % (ref 11.5–15.5)
WBC: 5.5 10*3/uL (ref 4.0–10.5)

## 2021-03-22 ENCOUNTER — Other Ambulatory Visit (HOSPITAL_BASED_OUTPATIENT_CLINIC_OR_DEPARTMENT_OTHER): Payer: Self-pay

## 2021-04-14 ENCOUNTER — Telehealth: Payer: Self-pay | Admitting: Family Medicine

## 2021-04-14 ENCOUNTER — Other Ambulatory Visit (HOSPITAL_BASED_OUTPATIENT_CLINIC_OR_DEPARTMENT_OTHER): Payer: Self-pay

## 2021-04-14 DIAGNOSIS — E119 Type 2 diabetes mellitus without complications: Secondary | ICD-10-CM

## 2021-04-14 MED ORDER — TIRZEPATIDE 2.5 MG/0.5ML ~~LOC~~ SOAJ
2.5000 mg | SUBCUTANEOUS | 0 refills | Status: DC
  Filled 2021-04-14: qty 2, 28d supply, fill #0

## 2021-04-14 NOTE — Telephone Encounter (Signed)
Start low dose mounjaro for diabetes and weight mgt.  May stop metformin.  Needs f/u visit for diabetes. Pt notified

## 2021-04-17 ENCOUNTER — Other Ambulatory Visit (HOSPITAL_BASED_OUTPATIENT_CLINIC_OR_DEPARTMENT_OTHER): Payer: Self-pay

## 2021-04-24 ENCOUNTER — Other Ambulatory Visit (HOSPITAL_COMMUNITY): Payer: Self-pay

## 2021-04-28 ENCOUNTER — Other Ambulatory Visit (HOSPITAL_BASED_OUTPATIENT_CLINIC_OR_DEPARTMENT_OTHER): Payer: Self-pay

## 2021-05-01 ENCOUNTER — Other Ambulatory Visit (HOSPITAL_COMMUNITY): Payer: Self-pay

## 2021-05-02 ENCOUNTER — Other Ambulatory Visit: Payer: Self-pay | Admitting: Family Medicine

## 2021-05-02 ENCOUNTER — Other Ambulatory Visit (HOSPITAL_BASED_OUTPATIENT_CLINIC_OR_DEPARTMENT_OTHER): Payer: Self-pay

## 2021-05-02 ENCOUNTER — Other Ambulatory Visit: Payer: Self-pay

## 2021-05-02 MED ORDER — PANTOPRAZOLE SODIUM 20 MG PO TBEC
20.0000 mg | DELAYED_RELEASE_TABLET | Freq: Two times a day (BID) | ORAL | 2 refills | Status: DC
  Filled 2021-05-02: qty 60, 30d supply, fill #0

## 2021-05-02 MED ORDER — PANTOPRAZOLE SODIUM 20 MG PO TBEC
20.0000 mg | DELAYED_RELEASE_TABLET | Freq: Two times a day (BID) | ORAL | 2 refills | Status: DC
  Filled 2021-05-02: qty 60, 30d supply, fill #0
  Filled 2021-08-01: qty 60, 30d supply, fill #1
  Filled 2021-10-20: qty 60, 30d supply, fill #2

## 2021-05-15 ENCOUNTER — Other Ambulatory Visit (HOSPITAL_BASED_OUTPATIENT_CLINIC_OR_DEPARTMENT_OTHER): Payer: Self-pay

## 2021-05-16 ENCOUNTER — Ambulatory Visit (INDEPENDENT_AMBULATORY_CARE_PROVIDER_SITE_OTHER): Payer: No Typology Code available for payment source | Admitting: Family Medicine

## 2021-05-16 ENCOUNTER — Other Ambulatory Visit (HOSPITAL_BASED_OUTPATIENT_CLINIC_OR_DEPARTMENT_OTHER): Payer: Self-pay

## 2021-05-16 ENCOUNTER — Encounter: Payer: Self-pay | Admitting: Family Medicine

## 2021-05-16 ENCOUNTER — Other Ambulatory Visit: Payer: Self-pay

## 2021-05-16 VITALS — BP 102/64 | HR 79 | Temp 98.1°F | Ht 66.0 in | Wt 174.8 lb

## 2021-05-16 DIAGNOSIS — D500 Iron deficiency anemia secondary to blood loss (chronic): Secondary | ICD-10-CM

## 2021-05-16 DIAGNOSIS — N920 Excessive and frequent menstruation with regular cycle: Secondary | ICD-10-CM | POA: Diagnosis not present

## 2021-05-16 DIAGNOSIS — F4322 Adjustment disorder with anxiety: Secondary | ICD-10-CM | POA: Diagnosis not present

## 2021-05-16 DIAGNOSIS — E119 Type 2 diabetes mellitus without complications: Secondary | ICD-10-CM | POA: Diagnosis not present

## 2021-05-16 DIAGNOSIS — E7849 Other hyperlipidemia: Secondary | ICD-10-CM

## 2021-05-16 LAB — POCT GLYCOSYLATED HEMOGLOBIN (HGB A1C): Hemoglobin A1C: 6 % — AB (ref 4.0–5.6)

## 2021-05-16 MED ORDER — TIRZEPATIDE 5 MG/0.5ML ~~LOC~~ SOAJ
5.0000 mg | SUBCUTANEOUS | 0 refills | Status: DC
  Filled 2021-05-16: qty 2, 28d supply, fill #0

## 2021-05-16 MED ORDER — SERTRALINE HCL 50 MG PO TABS
75.0000 mg | ORAL_TABLET | Freq: Every day | ORAL | 3 refills | Status: DC
  Filled 2021-05-16: qty 135, 90d supply, fill #0

## 2021-05-16 NOTE — Progress Notes (Signed)
Subjective  CC:  Chief Complaint  Patient presents with   Diabetes    HPI: Rachel [name] is a 46 y.o. female who presents to the office today for follow up of diabetes and problems listed above in the chief complaint.  Diabetes follow up: Her diabetic control is reported as improved. Tolerating mounjaro 2.5 mg weekly x 4 weeks now. Down' 6 pounds.  She denies exertional CP or SOB or symptomatic hypoglycemia. She denies foot sores or paresthesias.  HLD now on repatha and zetia. LDL now at goal. Reviewed lipid clinic notes.  IDA due to menorrhagia. Menses have settled down. Had 1 cycle in last 5 months. Lasted 7 days. On iron. No pain. ? Perimenopausal. ? Fibroids. Pt wants conservative eval: wait and watch.  HM: due cpe and pap.  Adjustment anxiety: doing much better on zoloft 50. Increased stressors due to difficulty getting needed medical care at home for her son and work stressors increasing are causing some anxiety again: mind racing, more emotional, restless sleep. No panic sxs or depressive sxs. No Aes from zoloft.    Wt Readings from Last 3 Encounters:  05/16/21 174 lb 12.8 oz (79.3 kg)  01/31/21 180 lb 3.2 oz (81.7 kg)  12/07/20 177 lb (80.3 kg)    BP Readings from Last 3 Encounters:  05/16/21 102/64  01/31/21 124/78  12/07/20 124/78    Assessment  1. Controlled type 2 diabetes mellitus without complication, without long-term current use of insulin ([place])   2. Familial hyperlipidemia   3. Adjustment reaction with anxiety   4. Menorrhagia with regular cycle   5. Iron deficiency anemia due to chronic blood loss      Plan  Diabetes is currently very well controlled. Increase mounjaro to 5mg  weekly. Continue diet and weight loss.  HLD now at goal on repatha and zetia Anxiety: counseling done. Increase zoloft to 75mg  daily and monitor.  Will monitor menses , continue iron. Recommend pelvic ultrasound if pain develops Return for cpe and pap  Follow up: 3 mo for cpe and  pap. Orders Placed This Encounter  Procedures   POCT HgB A1C   No orders of the defined types were placed in this encounter.     Immunization History  Administered Date(s) Administered   Influenza,inj,Quad PF,6+ Mos 12/27/2019, 12/21/2020   Influenza-Unspecified 12/13/2015, 12/06/2017, 12/09/2018   PFIZER(Purple Top)SARS-COV-2 Vaccination 08/24/2019, 09/12/2019    Diabetes Related Lab Review: Lab Results  Component Value Date   HGBA1C 6.0 (A) 05/16/2021   HGBA1C 6.3 (A) 12/07/2020   HGBA1C 6.3 (A) 08/10/2020    Lab Results  Component Value Date   MICROALBUR 0.8 08/10/2020   Lab Results  Component Value Date   CREATININE 0.47 07/29/2020   BUN 7 07/29/2020   NA 137 07/29/2020   K 4.0 07/29/2020   CL 102 07/29/2020   CO2 27 07/29/2020   Lab Results  Component Value Date   CHOL 138 12/21/2020   CHOL 184 08/10/2020   CHOL 249 (H) 03/21/2020   Lab Results  Component Value Date   HDL 53.10 12/21/2020   HDL 44.70 08/10/2020   HDL 47.50 03/21/2020   Lab Results  Component Value Date   LDLCALC 63 12/21/2020   LDLCALC 111 (H) 08/10/2020   LDLCALC 164 (H) 03/21/2020   Lab Results  Component Value Date   TRIG 109.0 12/21/2020   TRIG 142.0 08/10/2020   TRIG 184.0 (H) 03/21/2020   Lab Results  Component Value Date   CHOLHDL 3  12/21/2020   CHOLHDL 4 08/10/2020   CHOLHDL 5 03/21/2020   No results found for: LDLDIRECT The 10-year ASCVD risk score (Arnett DK, et al., 2019) is: 0.5%   Values used to calculate the score:     Age: 88 years     Sex: Female     Is Non-Hispanic African American: No     Diabetic: Yes     Tobacco smoker: No     Systolic Blood Pressure: [id number] mmHg     Is BP treated: No     HDL Cholesterol: 53.1 mg/dL     Total Cholesterol: 138 mg/dL I have reviewed the PMH, Fam and Soc history. Patient Active Problem List   Diagnosis Date Noted   Controlled type 2 diabetes mellitus without complication, without long-term current use of insulin ([place])  08/10/2020    Priority: High   Hyperlipidemia associated with type 2 diabetes mellitus ([place]) 03/29/2020    Priority: High   Familial hyperlipidemia 08/17/2015    Priority: High   GERD (gastroesophageal reflux disease) 08/10/2020    Priority: Medium    History of Helicobacter pylori infection 08/10/2020    Priority: Medium    History of peptic ulcer 08/10/2020    Priority: Medium    Adjustment reaction with anxiety 06/26/2017    Priority: Medium    Chronic migraine 12/28/2016    Priority: Medium     Referred to neurology. Did not tolerate topiramate.    Insomnia 11/01/2015    Priority: Medium    Orthostatic hypotension 08/17/2015    Priority: Medium    TMJ (dislocation of temporomandibular joint) 01/10/2021   Menorrhagia with regular cycle 12/07/2020   Migraine without aura, not intractable, without status migrainosus 12/07/2020   Myalgia due to statin 09/02/2020    Social History: Patient  reports that she has never smoked. She has never used smokeless tobacco. She reports that she does not drink alcohol and does not use drugs.  Review of Systems: Ophthalmic: negative for eye pain, loss of vision or double vision Cardiovascular: negative for chest pain Respiratory: negative for SOB or persistent cough Gastrointestinal: negative for abdominal pain Genitourinary: negative for dysuria or gross hematuria MSK: negative for foot lesions Neurologic: negative for weakness or gait disturbance  Objective  Vitals: BP 102/64    Pulse 79    Temp 98.1 F (36.7 C) (Temporal)    Ht 5\' 6"  (1.676 m)    Wt 174 lb 12.8 oz (79.3 kg)    LMP 05/09/2021 (Exact Date)    SpO2 99%    BMI 28.21 kg/m  General: well appearing, no acute distress  Psych:  Alert and oriented, normal mood and affect HEENT:  Normocephalic, atraumatic, moist mucous membranes, supple neck  Cardiovascular:  Nl S1 and S2, RRR without murmur, gallop or rub. no edema   Diabetic education: ongoing education regarding  chronic disease management for diabetes was given today. We continue to reinforce the ABC's of diabetic management: A1c (<7 or 8 dependent upon patient), tight blood pressure control, and cholesterol management with goal LDL < 100 minimally. We discuss diet strategies, exercise recommendations, medication options and possible side effects. At each visit, we review recommended immunizations and preventive care recommendations for diabetics and stress that good diabetic control can prevent other problems. See below for this patient's data.   Commons side effects, risks, benefits, and alternatives for medications and treatment plan prescribed today were discussed, and the patient expressed understanding of the given instructions. Patient is instructed to call  or message via MyChart if he/she has any questions or concerns regarding our treatment plan. No barriers to understanding were identified. We discussed Red Flag symptoms and signs in detail. Patient expressed understanding regarding what to do in case of urgent or emergency type symptoms.  Medication list was reconciled, printed and provided to the patient in AVS. Patient instructions and summary information was reviewed with the patient as documented in the AVS. This note was prepared with assistance of Dragon voice recognition software. Occasional wrong-word or sound-a-like substitutions may have occurred due to the inherent limitations of voice recognition software  This visit occurred during the SARS-CoV-2 public health emergency.  Safety protocols were in place, including screening questions prior to the visit, additional usage of staff PPE, and extensive cleaning of exam room while observing appropriate contact time as indicated for disinfecting solutions.

## 2021-06-12 ENCOUNTER — Other Ambulatory Visit (HOSPITAL_BASED_OUTPATIENT_CLINIC_OR_DEPARTMENT_OTHER): Payer: Self-pay

## 2021-06-12 ENCOUNTER — Other Ambulatory Visit: Payer: Self-pay | Admitting: Family Medicine

## 2021-06-12 MED ORDER — TIRZEPATIDE 5 MG/0.5ML ~~LOC~~ SOAJ
5.0000 mg | SUBCUTANEOUS | 2 refills | Status: AC
  Filled 2021-06-12: qty 2, 28d supply, fill #0
  Filled 2021-07-12: qty 2, 28d supply, fill #1

## 2021-06-14 ENCOUNTER — Other Ambulatory Visit (HOSPITAL_COMMUNITY): Payer: Self-pay

## 2021-06-22 ENCOUNTER — Other Ambulatory Visit (HOSPITAL_BASED_OUTPATIENT_CLINIC_OR_DEPARTMENT_OTHER): Payer: Self-pay

## 2021-06-22 ENCOUNTER — Ambulatory Visit (INDEPENDENT_AMBULATORY_CARE_PROVIDER_SITE_OTHER): Payer: No Typology Code available for payment source | Admitting: Family Medicine

## 2021-06-22 ENCOUNTER — Encounter: Payer: Self-pay | Admitting: Family Medicine

## 2021-06-22 VITALS — BP 116/74 | HR 106 | Temp 98.1°F | Ht 66.0 in | Wt 169.0 lb

## 2021-06-22 DIAGNOSIS — J029 Acute pharyngitis, unspecified: Secondary | ICD-10-CM

## 2021-06-22 LAB — POCT RAPID STREP A (OFFICE): Rapid Strep A Screen: NEGATIVE

## 2021-06-22 MED ORDER — PENICILLIN V POTASSIUM 500 MG PO TABS
500.0000 mg | ORAL_TABLET | Freq: Three times a day (TID) | ORAL | 0 refills | Status: AC
  Filled 2021-06-22: qty 30, 10d supply, fill #0

## 2021-06-22 NOTE — Patient Instructions (Signed)
Meds have been sent the the pharmacy You can take tylenol/ibuprofen for pain/fevers If worsening symptoms, let us know or go to the Emergency room  

## 2021-06-22 NOTE — Progress Notes (Signed)
? ?Subjective:  ? ? ? Patient ID: Nataki Mccrumb, female    DOB: 04/20/1975, 46 y.o.   MRN: [id number] ? ?Chief Complaint  ?Patient presents with  ? Sore Throat  ?  Started yesterday ?  ? Sinus Problem  ?  Started yesterday ?Took zyrtec ?  ? ? ?HPI ?Chief complaint: sore throat, sinus ?Symptom onset: yesterday ?Pertinent positives: sore throat. Stuffy nose and pressure in sinuses.  Some dizziness. Felt hot last pm. Felt cold.  Tickle in throat.  Gets strep freq,HA.  ?Pertinent negatives:  ?Treatments tried: zyrtec ?Vaccine status: 2 ?Sick exposure: none  ? ?Health Maintenance Due  ?Topic Date Due  ? COVID-19 Vaccine (3 - Booster for Pfizer series) 11/07/2019  ? PAP SMEAR-Modifier  10/31/2020  ? URINE MICROALBUMIN  08/10/2021  ? ? ?Past Medical History:  ?Diagnosis Date  ? Anxiety   ? Familial hyperlipidemia 08/17/2015  ? Migraine without aura, not intractable, without status migrainosus 12/07/2020  ? Migraines   ? Orthostatic hypotension 08/17/2015  ? Prediabetes   ? PUD (peptic ulcer disease)   ? ? ?Past Surgical History:  ?Procedure Laterality Date  ? CESAREAN SECTION    ? ? ?Outpatient Medications Prior to Visit  ?Medication Sig Dispense Refill  ? ALPRAZolam (XANAX) 0.25 MG tablet Take 1 tablet (0.25 mg total) by mouth daily as needed for anxiety. 20 tablet 0  ? cyclobenzaprine (FLEXERIL) 10 MG tablet Take 1 tablet (10 mg total) by mouth at bedtime as needed (TMJ). 30 tablet 2  ? Evolocumab (REPATHA SURECLICK) 782 MG/ML SOAJ Inject 1 pen into the skin every 14 (fourteen) days. 2 mL 11  ? ezetimibe (ZETIA) 10 MG tablet Take 1 tablet (10 mg total) by mouth daily. 90 tablet 3  ? fluticasone (FLONASE) 50 MCG/ACT nasal spray Place 1 spray into both nostrils 2 (two) times daily. (Patient taking differently: Place 1 spray into both nostrils as needed.) 16 g 6  ? glucose blood (FREESTYLE LITE) test strip Use once daily to check blood sugar R73.03 100 each 4  ? glucose monitoring kit (FREESTYLE) monitoring kit 1 each by Does  not apply route daily. 1 each 0  ? iron polysaccharides (NIFEREX) 150 MG capsule Take 1 capsule (150 mg total) by mouth daily. 60 capsule 2  ? Lancets MISC Use to check blood sugars daily. Please provide brand covered by insurance Dx code: R73.03 100 each 3  ? meclizine (ANTIVERT) 25 MG tablet Take 1 tablet (25 mg total) by mouth 3 (three) times daily as needed for dizziness. 30 tablet 0  ? Multiple Vitamin (MULTIVITAMIN WITH MINERALS) TABS tablet Take 1 tablet by mouth daily.    ? pantoprazole (PROTONIX) 20 MG tablet Take 1 tablet (20 mg total) by mouth 2 (two) times daily. 60 tablet 2  ? rizatriptan (MAXALT-MLT) 10 MG disintegrating tablet Take 1 tablet (10 mg total) by mouth as needed for migraine. May repeat in 2 hours if needed 10 tablet 2  ? rosuvastatin (CRESTOR) 5 MG tablet Take 1 tablet (5 mg total) by mouth daily. 30 tablet 11  ? sertraline (ZOLOFT) 50 MG tablet Take 1.5 tablets (75 mg total) by mouth daily. 135 tablet 3  ? tirzepatide [facility]) 5 MG/0.5ML Pen Inject 5 mg into the skin once a week. 2 mL 2  ? fludrocortisone (FLORINEF) 0.1 MG tablet Take by mouth. (Patient not taking: Reported on 06/22/2021)    ? ?No facility-administered medications prior to visit.  ? ? ?No Known Allergies ?ROS neg/noncontributory except as  noted HPI/below ? ? ?   ?Objective:  ?  ? ?BP 116/74   Pulse (!) 106   Temp 98.1 ?F (36.7 ?C) (Temporal)   Ht $R'5\' 6"'Tk$  (1.676 m)   Wt 169 lb (76.7 kg)   SpO2 98%   BMI 27.28 kg/m?  ?Wt Readings from Last 3 Encounters:  ?06/22/21 169 lb (76.7 kg)  ?05/16/21 174 lb 12.8 oz (79.3 kg)  ?01/31/21 180 lb 3.2 oz (81.7 kg)  ? ? ?Physical Exam  ? ?Gen: WDWN NAD ?HEENT: NCAT, conjunctiva not injected, sclera nonicteric ?TM WNL B, OP moist, no exudates ?NECK:  supple, no thyromegaly,mild, sl tender, submand nodes ?CARDIAC: RRR, S1S2+, no murmur. DP 2+B ?LUNGS: CTAB. No wheezes ?ABDOMEN:  BS+, soft, NTND, No HSM, no masses ?EXT:  no edema ?MSK: no gross abnormalities.  ?NEURO: A&O x3.  CN II-XII  intact.  ?PSYCH: normal mood. Good eye contact ? ?Results for orders placed or performed in visit on 06/22/21  ?POCT rapid strep A  ?Result Value Ref Range  ? Rapid Strep A Screen Negative Negative  ?  ? ?   ?Assessment & Plan:  ? ?Problem List Items Addressed This Visit   ?None ?Visit Diagnoses   ? ? Acute pharyngitis, unspecified etiology    -  Primary  ? ?  ? Pharyngitis-prob viral.  Pen to hold(long holiday weekend).  Symptomatic tx.  ? ?Meds ordered this encounter  ?Medications  ? penicillin v potassium (VEETID) 500 MG tablet  ?  Sig: Take 1 tablet (500 mg total) by mouth 3 (three) times daily for 10 days.  ?  Dispense:  30 tablet  ?  Refill:  0  ? ? ?Wellington Hampshire, MD ? ?

## 2021-07-12 ENCOUNTER — Other Ambulatory Visit (HOSPITAL_BASED_OUTPATIENT_CLINIC_OR_DEPARTMENT_OTHER): Payer: Self-pay

## 2021-07-26 ENCOUNTER — Ambulatory Visit (INDEPENDENT_AMBULATORY_CARE_PROVIDER_SITE_OTHER): Payer: No Typology Code available for payment source | Admitting: Family Medicine

## 2021-07-26 ENCOUNTER — Encounter: Payer: Self-pay | Admitting: Family Medicine

## 2021-07-26 ENCOUNTER — Other Ambulatory Visit (HOSPITAL_BASED_OUTPATIENT_CLINIC_OR_DEPARTMENT_OTHER): Payer: Self-pay

## 2021-07-26 VITALS — BP 100/74 | HR 88 | Temp 98.0°F | Ht 66.0 in | Wt 167.0 lb

## 2021-07-26 DIAGNOSIS — H8113 Benign paroxysmal vertigo, bilateral: Secondary | ICD-10-CM

## 2021-07-26 DIAGNOSIS — F4322 Adjustment disorder with anxiety: Secondary | ICD-10-CM

## 2021-07-26 DIAGNOSIS — E119 Type 2 diabetes mellitus without complications: Secondary | ICD-10-CM

## 2021-07-26 DIAGNOSIS — I951 Orthostatic hypotension: Secondary | ICD-10-CM

## 2021-07-26 DIAGNOSIS — Z72820 Sleep deprivation: Secondary | ICD-10-CM

## 2021-07-26 MED ORDER — MECLIZINE HCL 25 MG PO TABS
25.0000 mg | ORAL_TABLET | Freq: Three times a day (TID) | ORAL | 0 refills | Status: DC | PRN
  Filled 2021-07-26: qty 30, 10d supply, fill #0

## 2021-07-26 NOTE — Progress Notes (Signed)
? ?Subjective  ?CC:  ?Chief Complaint  ?Patient presents with  ? Headache  ?  Pt stated that she has been experiencing headache and some dizzness for the past 5/6 days. OTC ibuprofen/tylenol. Medication helps for headache but not the dizzy spells.  ? ? ?HPI: Rachel [name] is a 46 y.o. female who presents to the office today to address the problems listed above in the chief complaint. ?46 year old female presents with dizziness.  Feels lightheaded and cannot move quickly or everything comes off balance.  This is ongoing for last 5 to 6 days.  She denies double vision, paresis or headaches.  Of note, she stopped her Zoloft about 2 to 3 weeks ago.  She did wean down from 75 mg daily over the course of 2 weeks.  She feels her anxiety is not completely controlled off medications but the medication made her feel sleepy.  See next ?Sleep deprivation: Chronically sleep deprived.  Her son is home and ventilator dependent.  Unfortunately she does not have home health care available to her.  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  She does not sleep even when her husband is caring for her son.  She worries.  At times she has used Flexeril to get a little bit of sleep. ?History of orthostatic hypotension with low blood pressure on GLP-1 agonist for diabetes. ? ? Assessment  ?1. Benign paroxysmal positional vertigo due to bilateral vestibular disorder   ?2. Orthostatic hypotension   ?3. Controlled type 2 diabetes mellitus without complication, without long-term current use of insulin ([place])   ?4. Adjustment reaction with anxiety   ?5. Sleep deprivation   ? ?  ?Plan  ?Positional vertigo: Positive exam.  Meclizine to help with symptoms.  Education given.  At risk for falls given chronically low blood pressure and orthostatic hypotension.  Continue to hydrate well. ?Type 2 diabetes and GLP-1 agonist: We will continue to monitor blood pressures.  May need to lower dose but currently, blood  pressures are at her norm. ?Adjustment reaction anxiety: Likely needs to be on an SSRI however she feels that she cannot tolerate the side effects given her current needs to care for her son.  Will monitor closely. ?Sleep deprivation chronic: Counseling education given.  Recommend consideration for FMLA continuous to try to reverse.  Patient will consider. ? ?Follow up: 2 to 3 weeks to recheck ?08/09/2021 ? ?No orders of the defined types were placed in this encounter. ? ?Meds ordered this encounter  ?Medications  ? meclizine (ANTIVERT) 25 MG tablet  ?  Sig: Take 1 tablet (25 mg total) by mouth 3 (three) times daily as needed for dizziness.  ?  Dispense:  30 tablet  ?  Refill:  0  ? ?  ? ?I reviewed the patients updated PMH, FH, and SocHx.  ?  ?Patient Active Problem List  ? Diagnosis Date Noted  ? Controlled type 2 diabetes mellitus without complication, without long-term current use of insulin ([place]) 08/10/2020  ?  Priority: High  ? Hyperlipidemia associated with type 2 diabetes mellitus ([place]) 03/29/2020  ?  Priority: High  ? Familial hyperlipidemia 08/17/2015  ?  Priority: High  ? GERD (gastroesophageal reflux disease) 08/10/2020  ?  Priority: Medium   ? History of Helicobacter pylori infection 08/10/2020  ?  Priority: Medium   ? History of peptic ulcer 08/10/2020  ?  Priority: Medium   ? Adjustment reaction with anxiety 06/26/2017  ?  Priority:  Medium   ? Chronic migraine 12/28/2016  ?  Priority: Medium   ? Insomnia 11/01/2015  ?  Priority: Medium   ? Orthostatic hypotension 08/17/2015  ?  Priority: Medium   ? TMJ (dislocation of temporomandibular joint) 01/10/2021  ? Menorrhagia with regular cycle 12/07/2020  ? Migraine without aura, not intractable, without status migrainosus 12/07/2020  ? Myalgia due to statin 09/02/2020  ? ?Current Meds  ?Medication Sig  ? Evolocumab (REPATHA SURECLICK) 751 MG/ML SOAJ Inject 1 pen into the skin every 14 (fourteen) days.  ? ezetimibe (ZETIA) 10 MG tablet Take 1 tablet (10 mg  total) by mouth daily.  ? fludrocortisone (FLORINEF) 0.1 MG tablet Take by mouth.  ? fluticasone (FLONASE) 50 MCG/ACT nasal spray Place 1 spray into both nostrils 2 (two) times daily. (Patient taking differently: Place 1 spray into both nostrils as needed.)  ? glucose blood (FREESTYLE LITE) test strip Use once daily to check blood sugar R73.03  ? glucose monitoring kit (FREESTYLE) monitoring kit 1 each by Does not apply route daily.  ? iron polysaccharides (NIFEREX) 150 MG capsule Take 1 capsule (150 mg total) by mouth daily.  ? Lancets MISC Use to check blood sugars daily. Please provide brand covered by insurance Dx code: R73.03  ? meclizine (ANTIVERT) 25 MG tablet Take 1 tablet (25 mg total) by mouth 3 (three) times daily as needed for dizziness.  ? Multiple Vitamin (MULTIVITAMIN WITH MINERALS) TABS tablet Take 1 tablet by mouth daily.  ? pantoprazole (PROTONIX) 20 MG tablet Take 1 tablet (20 mg total) by mouth 2 (two) times daily.  ? rosuvastatin (CRESTOR) 5 MG tablet Take 1 tablet (5 mg total) by mouth daily.  ? tirzepatide (MOUNJARO) 5 MG/0.5ML Pen Inject 5 mg into the skin once a week.  ? [DISCONTINUED] meclizine (ANTIVERT) 25 MG tablet Take 1 tablet (25 mg total) by mouth 3 (three) times daily as needed for dizziness.  ? ? ?Allergies: ?Patient has No Known Allergies. ?Family History: ?Patient family history includes Breast cancer in her paternal aunt; Heart disease in her sister; Hyperlipidemia in her daughter, mother, sister, and another family member; Spina bifida in her son. ?Social History:  ?Patient  reports that she has never smoked. She has never used smokeless tobacco. She reports that she does not drink alcohol and does not use drugs. ? ?Review of Systems: ?Constitutional: Negative for fever malaise or anorexia ?Cardiovascular: negative for chest pain ?Respiratory: negative for SOB or persistent cough ?Gastrointestinal: negative for abdominal pain ? ?Objective  ?Vitals: BP 100/74   Pulse 88   Temp  98 ?F (36.7 ?C)   Ht $R'5\' 6"'RG$  (1.676 m)   Wt 167 lb (75.8 kg)   SpO2 98%   BMI 26.95 kg/m?  ?General: no acute distress , A&Ox3, nontoxic-appearing but obviously dizzy with head movement ?Neuro: No nystagmus, PERRLA, normal movement of extremities.  Positive vertigo bilaterally with head movement with steady gait.  Did not perform Hallpike maneuver given her significant symptoms ?HEENT: PEERL, conjunctiva normal, neck is supple ?Cardiovascular:  RRR without murmur or gallop.  ?Respiratory:  Good breath sounds bilaterally, CTAB with normal respiratory effort ?Skin:  Warm, no rashes ? ? ? ?Commons side effects, risks, benefits, and alternatives for medications and treatment plan prescribed today were discussed, and the patient expressed understanding of the given instructions. Patient is instructed to call or message via MyChart if he/she has any questions or concerns regarding our treatment plan. No barriers to understanding were identified. We discussed Red Flag  symptoms and signs in detail. Patient expressed understanding regarding what to do in case of urgent or emergency type symptoms.  ?Medication list was reconciled, printed and provided to the patient in AVS. Patient instructions and summary information was reviewed with the patient as documented in the AVS. ?This note was prepared with assistance of Systems analyst. Occasional wrong-word or sound-a-like substitutions may have occurred due to the inherent limitations of voice recognition software ? ?This visit occurred during the SARS-CoV-2 public health emergency.  Safety protocols were in place, including screening questions prior to the visit, additional usage of staff PPE, and extensive cleaning of exam room while observing appropriate contact time as indicated for disinfecting solutions.  ? ?

## 2021-07-26 NOTE — Patient Instructions (Signed)
Please return in 2-3 weeks for recheck.  ? ?Use the antivert to treat the dizziness.  ? ?I will keep the mounjaro at the same dose; we can lower it if your symptoms do not improve.  ?Sleep! ? ?If you have any questions or concerns, please don't hesitate to send me a message via MyChart or call the office at [phone]. Thank you for visiting with [place] today! It's our pleasure caring for you.  ?

## 2021-07-28 ENCOUNTER — Other Ambulatory Visit: Payer: Self-pay | Admitting: *Deleted

## 2021-07-28 ENCOUNTER — Encounter: Payer: Self-pay | Admitting: Family Medicine

## 2021-07-28 DIAGNOSIS — H8113 Benign paroxysmal vertigo, bilateral: Secondary | ICD-10-CM

## 2021-07-28 NOTE — Telephone Encounter (Signed)
Referral placed as requested.

## 2021-08-01 ENCOUNTER — Other Ambulatory Visit: Payer: Self-pay

## 2021-08-01 ENCOUNTER — Other Ambulatory Visit (HOSPITAL_BASED_OUTPATIENT_CLINIC_OR_DEPARTMENT_OTHER): Payer: Self-pay

## 2021-08-01 DIAGNOSIS — H8113 Benign paroxysmal vertigo, bilateral: Secondary | ICD-10-CM

## 2021-08-04 NOTE — Therapy (Signed)
OUTPATIENT PHYSICAL THERAPY VESTIBULAR EVALUATION     Patient Name: Rachel [name] MRN: [id number] DOB:[birth year], 46 y.o., female 65 Date: 08/07/2021  PCP: Willow Ora, MD REFERRING PROVIDER: Willow Ora, MD     PT End of Session - 08/07/21 [phone]     Visit Number 1    Number of Visits 7    Date for PT Re-Evaluation 09/18/21    Authorization Type [facility]    Authorization - Visit Number 1    Authorization - Number of Visits 25    PT Start Time 0804    PT Stop Time 0845    PT Time Calculation (min) 41 min    Activity Tolerance Patient tolerated treatment well;Other (comment)   dizziness and nausea   Behavior During Therapy [facility] for tasks assessed/performed;Anxious             Past Medical History:  Diagnosis Date   Anxiety    Familial hyperlipidemia 08/17/2015   Migraine without aura, not intractable, without status migrainosus 12/07/2020   Migraines    Orthostatic hypotension 08/17/2015   Prediabetes    PUD (peptic ulcer disease)    Past Surgical History:  Procedure Laterality Date   CESAREAN SECTION     Patient Active Problem List   Diagnosis Date Noted   TMJ (dislocation of temporomandibular joint) 01/10/2021   Menorrhagia with regular cycle 12/07/2020   Migraine without aura, not intractable, without status migrainosus 12/07/2020   Myalgia due to statin 09/02/2020   Controlled type 2 diabetes mellitus without complication, without long-term current use of insulin ([facility]) 08/10/2020   GERD (gastroesophageal reflux disease) 08/10/2020   History of Helicobacter pylori infection 08/10/2020   History of peptic ulcer 08/10/2020   Hyperlipidemia associated with type 2 diabetes mellitus ([facility]) 03/29/2020   Adjustment reaction with anxiety 06/26/2017   Chronic migraine 12/28/2016   Insomnia 11/01/2015   Orthostatic hypotension 08/17/2015   Familial hyperlipidemia 08/17/2015    ONSET DATE: 07/17/21  REFERRING DIAG: H81.13 (ICD-10-CM) - Benign paroxysmal  positional vertigo due to bilateral vestibular disorder R42 (ICD-10-CM) - Vertigo   THERAPY DIAG:  Dizziness and giddiness  Unsteadiness on feet  Rationale for Evaluation and Treatment Rehabilitation  SUBJECTIVE:   SUBJECTIVE STATEMENT: Patient reports dizziness for the past 3 weeks without known cause. Episodes last 5-10 minutes and are described as "losing balance, floating." Worse with sitting to standing, turning, rolling in bed, getting out of bed. Denies head trauma, infection/illness, vision changes/double vision, hearing loss, tinnitus, otalgia, photophobia. Reports phonophobia and migraines nearly daily at baseline- notes that triggers are poor hydration, lack of sleep. Reports that she usually doesn't get vertigo during her migraines but did for the 1st time last week. Also reports known hx of orthostasis but this dizziness sensation feels different.  Pt accompanied by: self  PERTINENT HISTORY: anxiety, migraine, orthostasis, pre-DM   PAIN:  Are you having pain? No  PRECAUTIONS: None  WEIGHT BEARING RESTRICTIONS No  FALLS: Has patient fallen in last 6 months? No  LIVING ENVIRONMENT: Lives with: lives with their family Lives in: House/apartment Stairs: 4 steps to get in Has following equipment at home: None  PLOF: Independent  PATIENT GOALS improve dizziness  OBJECTIVE:   DIAGNOSTIC FINDINGS: none recent  COGNITION: Overall cognitive status: Within functional limits for tasks assessed   SENSATION: WFL  POSTURE: rounded shoulders   GAIT: Gait pattern: WFL Assistive device utilized: None Level of assistance: Complete Independence   VESTIBULAR ASSESSMENT   GENERAL OBSERVATION: pt wearing bifocals which  she wears daily; head held in L cervical SBing    OCULOMOTOR EXAM:   Ocular Alignment: normal   Ocular ROM: No Limitations   Spontaneous Nystagmus: absent   Gaze-Induced Nystagmus: absent   Smooth Pursuits: intact; c/o dizziness   Saccades:   slightly slower to R; c/o dizziness   Convergence/Divergence: ~16 in     VESTIBULAR - OCULAR REFLEX:    Slow VOR: Comment: slow and poor fixation B horizontal and vertical; c/o dizziness   VOR Cancellation: Unable to Maintain Gaze and c/o dizziness and nausea   Head-Impulse Test: HIT Right: negative HIT Left: negative c/o dizziness B    POSITIONAL TESTING:  Right Roll Test: negative but c/o mild dizziness during the roll Left Roll Test: negative but c/o mild dizziness during the roll Right Dix-Hallpike: c/o dizziness during transition which settles with prolonged positioning  Left Dix-Hallpike: c/o dizziness during transition which settles with prolonged positioning   *trialed standing HEP exercises with mild imbalance  VESTIBULAR TREATMENT:  PATIENT EDUCATION: Education details: prognosis, POC, HEP Person educated: Patient Education method: Explanation, Demonstration, Tactile cues, Verbal cues, and Handouts Education comprehension: verbalized understanding and returned demonstration  Access Code: [email or web address] URL: [email or web address] Date: 08/07/2021 Prepared by: [facility] - Outpatient  Rehab - [facility] *standing HEP to be performed at counter Exercises - Seated Left Head Turns Vestibular Habituation  - 1 x daily - 5 x weekly - 2-3 sets - 10 reps - Seated Head Nods Vestibular Habituation  - 1 x daily - 5 x weekly - 2-3 sets - 10 reps - Seated VOR Cancellation  - 1 x daily - 5 x weekly - 2-3 sets - 10 reps - Standing with Head Rotation  - 1 x daily - 5 x weekly - 2-3 sets - 10 reps - Narrow Stance with Counter Support  - 1 x daily - 5 x weekly - 2-3 sets - 30 sec hold  GOALS: Goals reviewed with patient? Yes  SHORT TERM GOALS: Target date: 08/28/2021  Patient to be independent with initial HEP. Baseline: HEP initiated Goal status: INITIAL    LONG TERM GOALS: Target date: 09/18/2021  Patient to be independent with advanced HEP. Baseline: Not yet  initiated  Goal status: INITIAL  Patient to report 0/10 dizziness with standing vertical and horizontal VOR for 30 seconds. Baseline: Unable Goal status: INITIAL  Patient will report 0/10 dizziness with bed mobility.  Baseline: Symptomatic  Goal status: INITIAL  Patient to demonstrate mild sway with M-CTSIB condition with eyes closed/foam surface in order to improve safety in environments with uneven surfaces and dim lighting. Baseline: NT Goal status: INITIAL  Patient to score at least 20/24 on DGI in order to decrease risk of falls. Baseline: NT Goal status: INITIAL    ASSESSMENT:  CLINICAL IMPRESSION:   Patient is a 46 y/o F presenting to OPPT with c/o dizziness for the past 3 weeks.  Episodes last 5-10 minutes and are described as "losing balance, floating." Worse with sitting to standing, turning, rolling in bed, getting out of bed. Denies head trauma, infection/illness, vision changes/double vision, hearing loss, tinnitus, otalgia, photophobia. Reports phonophobia and migraines nearly daily at baseline. Oculomotor exam revealed dizziness with smooth pursuits, saccades, and VOR cancellation, convergence insufficiency, poor fixation with slow VOR. Positional testing was symptomatic during movement itself rather than with prolonged positioning, suggesting motion sensitivity rather than BPPV. Patient was educated on gentle gaze stability and balance HEP and reported understanding. Would benefit from skilled PT services 1x/week for  6 weeks to address aforementioned impairments in order to optimize level of function.     OBJECTIVE IMPAIRMENTS decreased balance, dizziness, improper body mechanics, and postural dysfunction.   ACTIVITY LIMITATIONS carrying, lifting, bending, sitting, standing, squatting, sleeping, stairs, transfers, bed mobility, bathing, toileting, dressing, reach over head, hygiene/grooming, and caring for others  PARTICIPATION LIMITATIONS: meal prep, cleaning,  laundry, driving, shopping, community activity, occupation, yard work, and church  PERSONAL FACTORS Age, Behavior pattern, Fitness, Past/current experiences, Time since onset of injury/illness/exacerbation, and 3+ comorbidities: anxiety, migraine, orthostasis, pre-DM  are also affecting patient's functional outcome.   REHAB POTENTIAL: Good  CLINICAL DECISION MAKING: Evolving/moderate complexity  EVALUATION COMPLEXITY: Moderate   PLAN: PT FREQUENCY: 1x/week  PT DURATION: 6 weeks  PLANNED INTERVENTIONS: Therapeutic exercises, Therapeutic activity, Neuromuscular re-education, Balance training, Gait training, Patient/Family education, Joint mobilization, Stair training, Vestibular training, Canalith repositioning, Dry Needling, Electrical stimulation, Cryotherapy, Moist heat, Taping, Manual therapy, and Re-evaluation  PLAN FOR NEXT SESSION: assess M-CTSIB, DGI, progress VOR and sensory integration  Anette GuarneriYevgeniya Nicklaus Alviar, PT, DPT 08/07/21 9:44 AM

## 2021-08-07 ENCOUNTER — Encounter: Payer: Self-pay | Admitting: Physical Therapy

## 2021-08-07 ENCOUNTER — Ambulatory Visit: Payer: No Typology Code available for payment source | Attending: Family Medicine | Admitting: Physical Therapy

## 2021-08-07 DIAGNOSIS — R2681 Unsteadiness on feet: Secondary | ICD-10-CM | POA: Diagnosis present

## 2021-08-07 DIAGNOSIS — R42 Dizziness and giddiness: Secondary | ICD-10-CM | POA: Diagnosis present

## 2021-08-09 ENCOUNTER — Ambulatory Visit (INDEPENDENT_AMBULATORY_CARE_PROVIDER_SITE_OTHER): Payer: No Typology Code available for payment source | Admitting: Family Medicine

## 2021-08-09 ENCOUNTER — Encounter: Payer: Self-pay | Admitting: Family Medicine

## 2021-08-09 VITALS — BP 110/80 | HR 84 | Temp 98.3°F | Ht 66.0 in | Wt 166.2 lb

## 2021-08-09 DIAGNOSIS — R5383 Other fatigue: Secondary | ICD-10-CM

## 2021-08-09 DIAGNOSIS — F4322 Adjustment disorder with anxiety: Secondary | ICD-10-CM | POA: Diagnosis not present

## 2021-08-09 DIAGNOSIS — D500 Iron deficiency anemia secondary to blood loss (chronic): Secondary | ICD-10-CM

## 2021-08-09 DIAGNOSIS — H8113 Benign paroxysmal vertigo, bilateral: Secondary | ICD-10-CM

## 2021-08-09 DIAGNOSIS — G43009 Migraine without aura, not intractable, without status migrainosus: Secondary | ICD-10-CM

## 2021-08-09 LAB — CBC WITH DIFFERENTIAL/PLATELET
Basophils Absolute: 0 10*3/uL (ref 0.0–0.1)
Basophils Relative: 0.9 % (ref 0.0–3.0)
Eosinophils Absolute: 0.1 10*3/uL (ref 0.0–0.7)
Eosinophils Relative: 2.2 % (ref 0.0–5.0)
HCT: 37.5 % (ref 36.0–46.0)
Hemoglobin: 12.4 g/dL (ref 12.0–15.0)
Lymphocytes Relative: 43.5 % (ref 12.0–46.0)
Lymphs Abs: 2.1 10*3/uL (ref 0.7–4.0)
MCHC: 33.1 g/dL (ref 30.0–36.0)
MCV: 79.6 fl (ref 78.0–100.0)
Monocytes Absolute: 0.3 10*3/uL (ref 0.1–1.0)
Monocytes Relative: 6.1 % (ref 3.0–12.0)
Neutro Abs: 2.3 10*3/uL (ref 1.4–7.7)
Neutrophils Relative %: 47.3 % (ref 43.0–77.0)
Platelets: 379 10*3/uL (ref 150.0–400.0)
RBC: 4.71 Mil/uL (ref 3.87–5.11)
RDW: 14.5 % (ref 11.5–15.5)
WBC: 4.9 10*3/uL (ref 4.0–10.5)

## 2021-08-09 LAB — B12 AND FOLATE PANEL
Folate: 7.8 ng/mL (ref >5.9)
Vitamin B-12: 614 pg/mL (ref 211–911)

## 2021-08-09 LAB — TSH: TSH: 1.43 u[IU]/mL (ref 0.35–5.50)

## 2021-08-09 NOTE — Patient Instructions (Signed)
Please follow up as scheduled for your next visit with me: 08/23/2021   If you have any questions or concerns, please don't hesitate to send me a message via MyChart or call the office at [phone]. Thank you for visiting with [place] today! It's our pleasure caring for you.

## 2021-08-09 NOTE — Progress Notes (Signed)
Subjective  CC:   Chief Complaint  Patient presents with   Dizziness    Pt here for a 2-3 wk F/U with dizziness. Pt stated that the dizzy spells are better at times and has been doing exercises at home but they make her more dizzy.Pt has an appt 09/05/2021 for eye exam     HPI: Rachel [name] is a 46 y.o. female who presents to the office today to address the problems listed above in the chief complaint. BPPV: has been a rough ride; had vestibular therapy for first time last week. Dizziness is now slightly improving. Meclizine helped.  Has felt tired, washed out. Sleeping a bit more. See last note.  Anxiety is high but remains off zoloft. Had 3 migraines last week. Triggers are lack of sleep and stress. Having regular cycles again lasting 6 days.     Assessment  1. Benign paroxysmal positional vertigo due to bilateral vestibular disorder   2. Iron deficiency anemia due to chronic blood loss   3. Other fatigue   4. Adjustment reaction with anxiety   5. Migraine without aura, not intractable, without status migrainosus      Plan  bppv:  improving, albeit slowly. No new sxs. Continue vestib rehab Fatigues and sleep deprivation; pt declines sleep meds. Check thyroid and r/u recurrent anemia.  Declines anxiety meds but discuss changing to lexapro. She will consider it Migraines.declines preventives.    Follow up: for cpe  08/23/2021  Orders Placed This Encounter  Procedures   CBC with Differential/Platelet   Iron, TIBC and Ferritin Panel   B12 and Folate Panel   TSH   No orders of the defined types were placed in this encounter.     I reviewed the patients updated PMH, FH, and SocHx.    Patient Active Problem List   Diagnosis Date Noted   Controlled type 2 diabetes mellitus without complication, without long-term current use of insulin ([place]) 08/10/2020    Priority: High   Hyperlipidemia associated with type 2 diabetes mellitus ([place]) 03/29/2020    Priority: High    Familial hyperlipidemia 08/17/2015    Priority: High   GERD (gastroesophageal reflux disease) 08/10/2020    Priority: Medium    History of Helicobacter pylori infection 08/10/2020    Priority: Medium    History of peptic ulcer 08/10/2020    Priority: Medium    Adjustment reaction with anxiety 06/26/2017    Priority: Medium    Chronic migraine 12/28/2016    Priority: Medium    Insomnia 11/01/2015    Priority: Medium    Orthostatic hypotension 08/17/2015    Priority: Medium    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   ezetimibe (ZETIA) 10 MG tablet Take 1 tablet (10 mg total) by mouth daily.   fludrocortisone (FLORINEF) 0.1 MG tablet Take by mouth.   fluticasone (FLONASE) 50 MCG/ACT nasal spray Place 1 spray into both nostrils 2 (two) times daily. (Patient taking differently: Place 1 spray into both nostrils as needed.)   glucose blood (FREESTYLE LITE) test strip Use once daily to check blood sugar R73.03   glucose monitoring kit (FREESTYLE) monitoring kit 1 each by Does not apply route daily.  iron polysaccharides (NIFEREX) 150 MG capsule Take 1 capsule (150 mg total) by mouth daily.   Lancets MISC Use to check blood sugars daily. Please provide brand covered by insurance Dx code: R73.03   meclizine (ANTIVERT) 25 MG tablet Take 1 tablet (25 mg total) by mouth 3 (three) times daily as needed for dizziness.   Multiple Vitamin (MULTIVITAMIN WITH MINERALS) TABS tablet Take 1 tablet by mouth daily.   pantoprazole (PROTONIX) 20 MG tablet Take 1 tablet (20 mg total) by mouth 2 (two) times daily.   rizatriptan (MAXALT-MLT) 10 MG disintegrating tablet Take 1 tablet (10 mg total) by mouth as  needed for migraine. May repeat in 2 hours if needed   rosuvastatin (CRESTOR) 5 MG tablet Take 1 tablet (5 mg total) by mouth daily.   tirzepatide [facility]) 5 MG/0.5ML Pen Inject 5 mg into the skin once a week.    Allergies: Patient has No Known Allergies. Family History: Patient family history includes Breast cancer in her paternal aunt; Heart disease in her sister; Hyperlipidemia in her daughter, mother, sister, and another family member; Spina bifida in her son. Social History:  Patient  reports that she has never smoked. She has never used smokeless tobacco. She reports that she does not drink alcohol and does not use drugs.  Review of Systems: Constitutional: Negative for fever malaise or anorexia Cardiovascular: negative for chest pain Respiratory: negative for SOB or persistent cough Gastrointestinal: negative for abdominal pain  Objective  Vitals: BP 110/80   Pulse 84   Temp 98.3 F (36.8 C)   Ht _0  (1.676 m)   Wt 166 lb 3.2 oz (75.4 kg)   SpO2 99%   BMI 26.83 kg/m  General: no acute distress , A&Ox3, appears tired HEENT: PEERL, conjunctiva normal, neck is supple     Commons side effects, risks, benefits, and alternatives for medications and treatment plan prescribed today were discussed, and the patient expressed understanding of the given instructions. Patient is instructed to call or message via MyChart if he/she has any questions or concerns regarding our treatment plan. No barriers to understanding were identified. We discussed Red Flag symptoms and signs in detail. Patient expressed understanding regarding what to do in case of urgent or emergency type symptoms.  Medication list was reconciled, printed and provided to the patient in AVS. Patient instructions and summary information was reviewed with the patient as documented in the AVS. This note was prepared with assistance of Dragon voice recognition software. Occasional wrong-word or sound-a-like substitutions  may have occurred due to the inherent limitations of voice recognition software  This visit occurred during the SARS-CoV-2 public health emergency.  Safety protocols were in place, including screening questions prior to the visit, additional usage of staff PPE, and extensive cleaning of exam room while observing appropriate contact time as indicated for disinfecting solutions.

## 2021-08-10 LAB — IRON,TIBC AND FERRITIN PANEL
%SAT: 19 % (calc) (ref 16–45)
Ferritin: 11 ng/mL — ABNORMAL LOW (ref 16–232)
Iron: 72 ug/dL (ref 40–190)
TIBC: 379 mcg/dL (calc) (ref 250–450)

## 2021-08-10 NOTE — Therapy (Signed)
OUTPATIENT PHYSICAL THERAPY VESTIBULAR TREATMENT     Patient Name: Rachel [name] MRN: [id number] DOB:16-Oct-1975, 46 y.o., female Today's Date: 08/15/2021  PCP: Leamon Arnt, MD REFERRING PROVIDER: Leamon Arnt, MD     PT End of Session - 08/15/21 [phone]     Visit Number 2    Number of Visits 7    Date for PT Re-Evaluation 09/18/21    Authorization Type [facility]    Authorization - Visit Number 2    Authorization - Number of Visits 25    PT Start Time [id number]    PT Stop Time 0927    PT Time Calculation (min) 38 min    Equipment Utilized During Treatment Gait belt    Activity Tolerance Patient tolerated treatment well;Other (comment)   dizziness and nausea   Behavior During Therapy [facility] for tasks assessed/performed              Past Medical History:  Diagnosis Date   Anxiety    Familial hyperlipidemia 08/17/2015   Migraine without aura, not intractable, without status migrainosus 12/07/2020   Migraines    Orthostatic hypotension 08/17/2015   Prediabetes    PUD (peptic ulcer disease)    Past Surgical History:  Procedure Laterality Date   CESAREAN SECTION     Patient Active Problem List   Diagnosis Date Noted   TMJ (dislocation of temporomandibular joint) 01/10/2021   Menorrhagia with regular cycle 12/07/2020   Migraine without aura, not intractable, without status migrainosus 12/07/2020   Myalgia due to statin 09/02/2020   Controlled type 2 diabetes mellitus without complication, without long-term current use of insulin ([place]) 08/10/2020   GERD (gastroesophageal reflux disease) [id number]   History of Helicobacter pylori infection 08/10/2020   History of peptic ulcer 08/10/2020   Hyperlipidemia associated with type 2 diabetes mellitus ([place]) 03/29/2020   Adjustment reaction with anxiety 06/26/2017   Chronic migraine 12/28/2016   Insomnia 11/01/2015   Orthostatic hypotension 08/17/2015   Familial hyperlipidemia 08/17/2015    ONSET DATE: 07/17/21  REFERRING DIAG:  H81.13 (ICD-10-CM) - Benign paroxysmal positional vertigo due to bilateral vestibular disorder R42 (ICD-10-CM) - Vertigo   THERAPY DIAG:  Dizziness and giddiness  Unsteadiness on feet  Rationale for Evaluation and Treatment Rehabilitation  SUBJECTIVE:   SUBJECTIVE STATEMENT: Feeling a little but better. Doing the HEP which gets her a little dizzy. Has 1 migraine this week. Trying cupping for her migraines. Pt accompanied by: self  PERTINENT HISTORY: anxiety, migraine, orthostasis, pre-DM   PAIN:  Are you having pain? No  PRECAUTIONS: None   PATIENT GOALS improve dizziness     TODAY'S TREATMENT: 08/15/21 Activity Comments  Sitting/standing head turns to targets 30" each C/o feeling "not normal"; worse in standing  Sitting/standing head nods to targets 30" each C/o feeling "not normal"  sitting VOR cancellation 30" C/o 6/10 wooziness  Standing head turns/nods EC 30" C/o 5/10 dizziness once stopping  STS 5x each EC, EO foam, EC foam C/o "wobby"   PATIENT EDUCATION: Education details: HEP and expected sx severity with HEP; edu on today's testing results Person educated: Patient Education method: Explanation, Demonstration, Tactile cues, Verbal cues, and Handouts Education comprehension: verbalized understanding and returned demonstration   M-CTSIB  Condition 1: Firm Surface, EO 30 Sec, Normal Sway  Condition 2: Firm Surface, EC 30 Sec, Mild Sway  Condition 3: Foam Surface, EO 30 Sec, Mild Sway  Condition 4: Foam Surface, EC 30 Sec, Moderate and Severe Sway     Dynamic Gait Index  1. Gait level surface __2___ (3) Normal: Walks 20', no assistive devices, good sped, no evidence for imbalance, normal gait pattern (2) Mild Impairment: Walks 20', uses assistive devices, slower speed, mild gait deviations. (1) Moderate Impairment: Walks 20', slow speed, abnormal gait pattern, evidence for imbalance. (0) Severe Impairment: Cannot walk 20' without assistance, severe  gait deviations or imbalance.  2. Change in gait speed __2___ (3) Normal: Able to smoothly change walking speed without loss of balance or gait deviation. Shows a  significant difference in walking speeds between normal, fast and slow speeds. (2) Mild Impairment: Is able to change speed but demonstrates mild gait deviations, or not gait  deviations but unable to achieve a significant change in velocity, or uses an assistive device. (1) Moderate Impairment: Makes only minor adjustments to walking speed, or accomplishes a change  in speed with significant gait deviations, or changes speed but has significant gait deviations, or  changes speed but loses balance but is able to recover and continue walking. (0) Severe Impairment: Cannot change speeds, or loses balance and has to reach for wall or be caught.  3. Gait with horizontal head turns __2___ (3) Normal: Performs head turns smoothly with no change in gait. (2) Mild Impairment: Performs head turns smoothly with slight change in gait velocity, i.e., minor  disruption to smooth gait path or uses walking aid. (1) Moderate Impairment: Performs head turns with moderate change in gait velocity, slows down,  staggers but recovers, can continue to walk. (0) Severe Impairment: Performs task with severe disruption of gait, i.e., staggers  outside 15" path, loses balance, stops, reaches for wall.  4. Gait with vertical head turns __2___ (3) Normal: Performs head turns smoothly with no change in gait. (2) Mild Impairment: Performs head turns smoothly with slight change in gait velocity, i.e., minor  disruption to smooth gait path or uses walking aid. (1) Moderate Impairment: Performs head turns with moderate change in gait velocity, slows down,  staggers but recovers, can continue to walk. (0) Severe Impairment: Performs task with severe disruption of gait, i.e., staggers  outside 15" path, loses balance, stops, reaches for wall.  5. Gait and  pivot turn __3 *c/o dizziness___ (3) Normal: Pivot turns safely within 3 seconds and stops quickly with no loss of balance. (2) Mild Impairment: Pivot turns safely in > 3 seconds and stops with no loss of balance. (1) Moderate Impairment: Turns slowly, requires verbal cueing, requires several small steps to catch  balance following turn and stop. (0) Severe Impairment: Cannot turn safely, requires assistance to turn and stop.  6. Step over obstacle __3___ (3) Normal: Is able to step over the box without changing gait speed, no evidence of imbalance. (2) Mild Impairment: Is able to step over box, but must slow down and adjust steps to clear box safely. (1) Moderate Impairment: Is able to step over box but must stop, then step over. May require verbal  cueing. (0) Severe Impairment: Cannot perform without assistance.  7. Step around obstacles __3___ (3) Normal: Is able to walk around cones safely without changing gait speed; no evidence of  imbalance. (2) Mild Impairment: Is able to step around both cones, but must slow down and adjust steps to clear  cones. (1) Moderate Impairment: Is able to clear cones but must significantly slow, speed to accomplish task,  or requires verbal cueing. (0) Severe Impairment: Unable to clear cones, walks into one or both cones, or requires physical  assistance.  8. Steps __3___ (  3) Normal: Alternating feet, no rail. (2) Mild Impairment: Alternating feet, must use rail. (1) Moderate Impairment: Two feet to a stair, must use rail. (0) Severe Impairment: Cannot do safely.  TOTAL SCORE: __20___/ 24  Interpretation:  < 19/24 = predictive of falls in the elderly > 22/24 = safe ambulators   OBJECTIVE: measures were taken at time of initial evaluation unless otherwise specified   DIAGNOSTIC FINDINGS: none recent  VESTIBULAR ASSESSMENT   GENERAL OBSERVATION: pt wearing bifocals which she wears daily; head held in L cervical SBing    OCULOMOTOR  EXAM:   Ocular Alignment: normal   Ocular ROM: No Limitations   Spontaneous Nystagmus: absent   Gaze-Induced Nystagmus: absent   Smooth Pursuits: intact; c/o dizziness   Saccades:  slightly slower to R; c/o dizziness   Convergence/Divergence: ~16 in     VESTIBULAR - OCULAR REFLEX:    Slow VOR: Comment: slow and poor fixation B horizontal and vertical; c/o dizziness   VOR Cancellation: Unable to Maintain Gaze and c/o dizziness and nausea   Head-Impulse Test: HIT Right: negative HIT Left: negative c/o dizziness B    POSITIONAL TESTING:  Right Roll Test: negative but c/o mild dizziness during the roll Left Roll Test: negative but c/o mild dizziness during the roll Right Dix-Hallpike: c/o dizziness during transition which settles with prolonged positioning  Left Dix-Hallpike: c/o dizziness during transition which settles with prolonged positioning   *trialed standing HEP exercises with mild imbalance  VESTIBULAR TREATMENT:  PATIENT EDUCATION: Education details: prognosis, POC, HEP Person educated: Patient Education method: Explanation, Demonstration, Tactile cues, Verbal cues, and Handouts Education comprehension: verbalized understanding and returned demonstration  Access Code: [email or web address] URL: [email or web address] Date: 08/07/2021 Prepared by: [facility] *standing HEP to be performed at counter Exercises - Seated Left Head Turns Vestibular Habituation  - 1 x daily - 5 x weekly - 2-3 sets - 10 reps - Seated Head Nods Vestibular Habituation  - 1 x daily - 5 x weekly - 2-3 sets - 10 reps - Seated VOR Cancellation  - 1 x daily - 5 x weekly - 2-3 sets - 10 reps - Standing with Head Rotation  - 1 x daily - 5 x weekly - 2-3 sets - 10 reps - Narrow Stance with Counter Support  - 1 x daily - 5 x weekly - 2-3 sets - 30 sec hold  GOALS: Goals reviewed with patient? Yes  SHORT TERM GOALS: Target date: 08/28/2021  Patient to be  independent with initial HEP. Baseline: HEP initiated Goal status: IN PROGRESS    LONG TERM GOALS: Target date: 09/18/2021  Patient to be independent with advanced HEP. Baseline: Not yet initiated  Goal status: IN PROGRESS  Patient to report 0/10 dizziness with standing vertical and horizontal VOR for 30 seconds. Baseline: Unable Goal status: IN PROGRESS  Patient will report 0/10 dizziness with bed mobility.  Baseline: Symptomatic  Goal status: IN PROGRESS  Patient to demonstrate mild sway with M-CTSIB condition with eyes closed/foam surface in order to improve safety in environments with uneven surfaces and dim lighting. Baseline: NT Goal status: IN PROGRESS  Patient to score at least 20/24 on DGI in order to decrease risk of falls. Baseline: NT Goal status: IN PROGRESS    ASSESSMENT:  CLINICAL IMPRESSION:   Patient arrived to session without new complaints. Balance testing revealed difficulty using vestibular system for static balance and scored 20/24 on DGI, indicating decreased risk of falls. Patient with  c/o dizziness with turns and demonstrated hesitancy and slow speed with gait with head turns/nods. Able to progress head turns to standing with more challenge. VOR cancellation still brought on significant sx, thus left this exercise unchanged. Able to perform STS with balance challenges with good stability but with subjective report of feeling "wobbly." Updated HEP with exercises that were well-tolerated today. Patient reported understanding and without complaints at end of session.    OBJECTIVE IMPAIRMENTS decreased balance, dizziness, improper body mechanics, and postural dysfunction.   ACTIVITY LIMITATIONS carrying, lifting, bending, sitting, standing, squatting, sleeping, stairs, transfers, bed mobility, bathing, toileting, dressing, reach over head, hygiene/grooming, and caring for others  PARTICIPATION LIMITATIONS: meal prep, cleaning, laundry, driving, shopping,  community activity, occupation, yard work, and church  PERSONAL FACTORS Age, Behavior pattern, Fitness, Past/current experiences, Time since onset of injury/illness/exacerbation, and 3+ comorbidities: anxiety, migraine, orthostasis, pre-DM  are also affecting patient's functional outcome.   REHAB POTENTIAL: Good  CLINICAL DECISION MAKING: Evolving/moderate complexity  EVALUATION COMPLEXITY: Moderate   PLAN: PT FREQUENCY: 1x/week  PT DURATION: 6 weeks  PLANNED INTERVENTIONS: Therapeutic exercises, Therapeutic activity, Neuromuscular re-education, Balance training, Gait training, Patient/Family education, Joint mobilization, Stair training, Vestibular training, Canalith repositioning, Dry Needling, Electrical stimulation, Cryotherapy, Moist heat, Taping, Manual therapy, and Re-evaluation  PLAN FOR NEXT SESSION: progress VOR and sensory integration  Janene Harvey, PT, DPT 08/15/21 9:29 AM

## 2021-08-15 ENCOUNTER — Ambulatory Visit: Payer: No Typology Code available for payment source | Admitting: Physical Therapy

## 2021-08-15 ENCOUNTER — Encounter: Payer: Self-pay | Admitting: Physical Therapy

## 2021-08-15 DIAGNOSIS — R42 Dizziness and giddiness: Secondary | ICD-10-CM

## 2021-08-15 DIAGNOSIS — R2681 Unsteadiness on feet: Secondary | ICD-10-CM

## 2021-08-17 ENCOUNTER — Other Ambulatory Visit (HOSPITAL_BASED_OUTPATIENT_CLINIC_OR_DEPARTMENT_OTHER): Payer: Self-pay

## 2021-08-17 ENCOUNTER — Other Ambulatory Visit: Payer: Self-pay | Admitting: Family Medicine

## 2021-08-17 ENCOUNTER — Encounter (HOSPITAL_BASED_OUTPATIENT_CLINIC_OR_DEPARTMENT_OTHER): Payer: Self-pay | Admitting: Pharmacist

## 2021-08-17 DIAGNOSIS — I959 Hypotension, unspecified: Secondary | ICD-10-CM

## 2021-08-17 MED ORDER — FLUDROCORTISONE ACETATE 0.1 MG PO TABS
0.1000 mg | ORAL_TABLET | Freq: Every day | ORAL | 3 refills | Status: DC
  Filled 2021-08-17: qty 90, 90d supply, fill #0

## 2021-08-18 ENCOUNTER — Other Ambulatory Visit (INDEPENDENT_AMBULATORY_CARE_PROVIDER_SITE_OTHER): Payer: No Typology Code available for payment source

## 2021-08-18 ENCOUNTER — Other Ambulatory Visit (HOSPITAL_BASED_OUTPATIENT_CLINIC_OR_DEPARTMENT_OTHER): Payer: Self-pay

## 2021-08-18 DIAGNOSIS — I959 Hypotension, unspecified: Secondary | ICD-10-CM | POA: Diagnosis not present

## 2021-08-18 LAB — CORTISOL: Cortisol, Plasma: 7.7 ug/dL

## 2021-08-18 NOTE — Therapy (Addendum)
OUTPATIENT PHYSICAL THERAPY VESTIBULAR TREATMENT     Patient Name: Rachel [name] MRN: [id number] DOB:1975/10/08, 46 y.o., female Today's Date: 08/21/2021  PCP: Leamon Arnt, MD REFERRING PROVIDER: Leamon Arnt, MD     PT End of Session - 08/21/21 [phone]     Visit Number 3    Number of Visits 7    Date for PT Re-Evaluation 09/18/21    Authorization Type [facility]    Authorization - Visit Number 3    Authorization - Number of Visits 25    PT Start Time 0801    PT Stop Time 1941    PT Time Calculation (min) 33 min    Equipment Utilized During Treatment Gait belt    Activity Tolerance Patient tolerated treatment well;Other (comment)   dizziness and nausea   Behavior During Therapy [facility] for tasks assessed/performed               Past Medical History:  Diagnosis Date   Anxiety    Familial hyperlipidemia 08/17/2015   Migraine without aura, not intractable, without status migrainosus 12/07/2020   Migraines    Orthostatic hypotension 08/17/2015   Prediabetes    PUD (peptic ulcer disease)    Past Surgical History:  Procedure Laterality Date   CESAREAN SECTION     Patient Active Problem List   Diagnosis Date Noted   TMJ (dislocation of temporomandibular joint) 01/10/2021   Menorrhagia with regular cycle 12/07/2020   Migraine without aura, not intractable, without status migrainosus 12/07/2020   Myalgia due to statin 09/02/2020   Controlled type 2 diabetes mellitus without complication, without long-term current use of insulin ([place]) 08/10/2020   GERD (gastroesophageal reflux disease) 74/10/1446   History of Helicobacter pylori infection 08/10/2020   History of peptic ulcer 08/10/2020   Hyperlipidemia associated with type 2 diabetes mellitus ([place]) 03/29/2020   Adjustment reaction with anxiety 06/26/2017   Chronic migraine 12/28/2016   Insomnia 11/01/2015   Orthostatic hypotension 08/17/2015   Familial hyperlipidemia 08/17/2015    ONSET DATE: 07/17/21  REFERRING DIAG:  H81.13 (ICD-10-CM) - Benign paroxysmal positional vertigo due to bilateral vestibular disorder R42 (ICD-10-CM) - Vertigo   THERAPY DIAG:  Dizziness and giddiness  Unsteadiness on feet  Rationale for Evaluation and Treatment Rehabilitation  SUBJECTIVE:   SUBJECTIVE STATEMENT: Feeling better. Still getting dizzy daily but the duration is shorter.  Pt accompanied by: self  PERTINENT HISTORY: anxiety, migraine, orthostasis, pre-DM   PAIN:  Are you having pain? No  PRECAUTIONS: None   PATIENT GOALS improve dizziness    TODAY'S TREATMENT: 08/21/21 Activity Comments  sitting VOR cancellation horizontal 2x30" 5-7/10 dizziness  STS with vertical VOR cancellation with ball 8/10 dizziness; cues to decrease symptoms; transitioned to just sitting VOR cancellation d/t severity of symptoms  romberg head turns/nods EC 30" C/o feeling wobbly   STS on foam EO 5x, EC 5x Good stability   hip bumps EC (hip and shoulder) C/o nausea; intermittent cues for proper sequencing; c/o more nausea with shoulder strategy   Standing VOR horizontal in front of blinds 2x30" C/o 6-7/10 nausea and wobbly; slow speed d/t symptoms    PATIENT EDUCATION: Education details: HEP update Person educated: Patient Education method: Explanation, Demonstration, Tactile cues, Verbal cues, and Handouts Education comprehension: verbalized understanding and returned demonstration  Access Code: [email or web address] URL: [email or web address] Date: 08/21/2021 Prepared by: [facility]  Exercises - Seated VOR Cancellation  - 1 x daily - 5 x weekly - 2-3 sets -  30 sec hold - Narrow Stance with Counter Support  - 1 x daily - 5 x weekly - 2-3 sets - 30 sec hold - Standing with Head Rotation  - 1 x daily - 5 x weekly - 2-3 sets - 30 sec hold - Standing with Head Nod  - 1 x daily - 5 x weekly - 2-3 sets - 30 sec hold - Standing Anterior Posterior Weight Shift  - 1 x daily - 5 x weekly  - 2 sets - 10 reps   OBJECTIVE: measures were taken at time of initial evaluation unless otherwise specified   DIAGNOSTIC FINDINGS: none recent  VESTIBULAR ASSESSMENT   GENERAL OBSERVATION: pt wearing bifocals which she wears daily; head held in L cervical SBing    OCULOMOTOR EXAM:   Ocular Alignment: normal   Ocular ROM: No Limitations   Spontaneous Nystagmus: absent   Gaze-Induced Nystagmus: absent   Smooth Pursuits: intact; c/o dizziness   Saccades:  slightly slower to R; c/o dizziness   Convergence/Divergence: ~16 in     VESTIBULAR - OCULAR REFLEX:    Slow VOR: Comment: slow and poor fixation B horizontal and vertical; c/o dizziness   VOR Cancellation: Unable to Maintain Gaze and c/o dizziness and nausea   Head-Impulse Test: HIT Right: negative HIT Left: negative c/o dizziness B    POSITIONAL TESTING:  Right Roll Test: negative but c/o mild dizziness during the roll Left Roll Test: negative but c/o mild dizziness during the roll Right Dix-Hallpike: c/o dizziness during transition which settles with prolonged positioning  Left Dix-Hallpike: c/o dizziness during transition which settles with prolonged positioning   *trialed standing HEP exercises with mild imbalance   08/15/21:M-CTSIB  Condition 1: Firm Surface, EO 30 Sec, Normal Sway  Condition 2: Firm Surface, EC 30 Sec, Mild Sway  Condition 3: Foam Surface, EO 30 Sec, Mild Sway  Condition 4: Foam Surface, EC 30 Sec, Moderate and Severe Sway     08/15/21 Dynamic Gait Index score: 20/24  VESTIBULAR TREATMENT:  PATIENT EDUCATION: Education details: prognosis, POC, HEP Person educated: Patient Education method: Explanation, Demonstration, Tactile cues, Verbal cues, and Handouts Education comprehension: verbalized understanding and returned demonstration  Access Code: [email or web address] URL: [email or web address] Date: 08/07/2021 Prepared by: [facility] *standing HEP  to be performed at counter Exercises - Seated Left Head Turns Vestibular Habituation  - 1 x daily - 5 x weekly - 2-3 sets - 10 reps - Seated Head Nods Vestibular Habituation  - 1 x daily - 5 x weekly - 2-3 sets - 10 reps - Seated VOR Cancellation  - 1 x daily - 5 x weekly - 2-3 sets - 10 reps - Standing with Head Rotation  - 1 x daily - 5 x weekly - 2-3 sets - 10 reps - Narrow Stance with Counter Support  - 1 x daily - 5 x weekly - 2-3 sets - 30 sec hold  GOALS: Goals reviewed with patient? Yes  SHORT TERM GOALS: Target date: 08/28/2021  Patient to be independent with initial HEP. Baseline: HEP initiated Goal status: IN PROGRESS    LONG TERM GOALS: Target date: 09/18/2021  Patient to be independent with advanced HEP. Baseline: Not yet initiated  Goal status: IN PROGRESS  Patient to report 0/10 dizziness with standing vertical and horizontal VOR for 30 seconds. Baseline: Unable Goal status: IN PROGRESS  Patient will report 0/10 dizziness with bed mobility.  Baseline: Symptomatic  Goal status: IN PROGRESS  Patient  to demonstrate mild sway with M-CTSIB condition with eyes closed/foam surface in order to improve safety in environments with uneven surfaces and dim lighting. Baseline: moderate-severe sway 08/15/21 Goal status: IN PROGRESS  Patient to score at least 20/24 on DGI in order to decrease risk of falls. Baseline: NT Goal status: MET 08/15/21    ASSESSMENT:  CLINICAL IMPRESSION:  Patient arrived to session with report of improved duration of dizziness episodes. Worked on VOR cancellation as this continues to bring on most severe symptoms. Attempted to increase challenge with more dynamic VOR cancellation which was limited by symptoms. Good stability demonstrated with balance with EC today. Patient did however report c/o nausea, requiring short water breaks. Also added in optokinetic challenges which brought on further c/o nausea. Updated HEP and encouraged patient to  continue being consistent to make max progress. Ended session early today d/t c/o nausea and patient needing to go to work after appointment.     OBJECTIVE IMPAIRMENTS decreased balance, dizziness, improper body mechanics, and postural dysfunction.   ACTIVITY LIMITATIONS carrying, lifting, bending, sitting, standing, squatting, sleeping, stairs, transfers, bed mobility, bathing, toileting, dressing, reach over head, hygiene/grooming, and caring for others  PARTICIPATION LIMITATIONS: meal prep, cleaning, laundry, driving, shopping, community activity, occupation, yard work, and church  PERSONAL FACTORS Age, Behavior pattern, Fitness, Past/current experiences, Time since onset of injury/illness/exacerbation, and 3+ comorbidities: anxiety, migraine, orthostasis, pre-DM  are also affecting patient's functional outcome.   REHAB POTENTIAL: Good  CLINICAL DECISION MAKING: Evolving/moderate complexity  EVALUATION COMPLEXITY: Moderate   PLAN: PT FREQUENCY: 1x/week  PT DURATION: 6 weeks  PLANNED INTERVENTIONS: Therapeutic exercises, Therapeutic activity, Neuromuscular re-education, Balance training, Gait training, Patient/Family education, Joint mobilization, Stair training, Vestibular training, Canalith repositioning, Dry Needling, Electrical stimulation, Cryotherapy, Moist heat, Taping, Manual therapy, and Re-evaluation  PLAN FOR NEXT SESSION: progress VOR and sensory integration  Janene Harvey, PT, DPT 08/21/21 8:40 AM    PHYSICAL THERAPY DISCHARGE SUMMARY  Visits from Start of Care: 3  Current functional level related to goals / functional outcomes: Unable to assess; patient did not return   Remaining deficits: Unable to assess   Education / Equipment: HEP  Plan: Patient agrees to discharge.  Patient goals were not met. Patient is being discharged due to not returning to PT.     Janene Harvey, PT, DPT 11/06/21 10:45 AM  [facility] at  [facility], [place] [place], [place] [address] Phone # [phone] Fax # [phone]

## 2021-08-21 ENCOUNTER — Ambulatory Visit: Payer: No Typology Code available for payment source | Attending: Family Medicine | Admitting: Physical Therapy

## 2021-08-21 ENCOUNTER — Encounter: Payer: Self-pay | Admitting: Physical Therapy

## 2021-08-21 ENCOUNTER — Other Ambulatory Visit: Payer: Self-pay

## 2021-08-21 DIAGNOSIS — R2681 Unsteadiness on feet: Secondary | ICD-10-CM | POA: Insufficient documentation

## 2021-08-21 DIAGNOSIS — E2740 Unspecified adrenocortical insufficiency: Secondary | ICD-10-CM

## 2021-08-21 DIAGNOSIS — R42 Dizziness and giddiness: Secondary | ICD-10-CM | POA: Diagnosis present

## 2021-08-21 DIAGNOSIS — I959 Hypotension, unspecified: Secondary | ICD-10-CM

## 2021-08-23 ENCOUNTER — Encounter: Payer: Self-pay | Admitting: Family Medicine

## 2021-08-23 ENCOUNTER — Other Ambulatory Visit (HOSPITAL_COMMUNITY)
Admission: RE | Admit: 2021-08-23 | Discharge: 2021-08-23 | Disposition: A | Discharge status: Home / Self Care | Payer: No Typology Code available for payment source | Source: Ambulatory Visit | Attending: Family Medicine | Admitting: Family Medicine

## 2021-08-23 ENCOUNTER — Ambulatory Visit (INDEPENDENT_AMBULATORY_CARE_PROVIDER_SITE_OTHER): Payer: No Typology Code available for payment source | Admitting: Family Medicine

## 2021-08-23 ENCOUNTER — Other Ambulatory Visit (HOSPITAL_BASED_OUTPATIENT_CLINIC_OR_DEPARTMENT_OTHER): Payer: Self-pay

## 2021-08-23 VITALS — BP 80/60 | HR 106 | Temp 98.4°F | Ht 66.0 in | Wt 167.4 lb

## 2021-08-23 DIAGNOSIS — E785 Hyperlipidemia, unspecified: Secondary | ICD-10-CM | POA: Diagnosis not present

## 2021-08-23 DIAGNOSIS — Z124 Encounter for screening for malignant neoplasm of cervix: Secondary | ICD-10-CM | POA: Diagnosis present

## 2021-08-23 DIAGNOSIS — E1169 Type 2 diabetes mellitus with other specified complication: Secondary | ICD-10-CM

## 2021-08-23 DIAGNOSIS — N920 Excessive and frequent menstruation with regular cycle: Secondary | ICD-10-CM | POA: Diagnosis not present

## 2021-08-23 DIAGNOSIS — I959 Hypotension, unspecified: Secondary | ICD-10-CM

## 2021-08-23 DIAGNOSIS — E119 Type 2 diabetes mellitus without complications: Secondary | ICD-10-CM | POA: Diagnosis not present

## 2021-08-23 DIAGNOSIS — Z0000 Encounter for general adult medical examination without abnormal findings: Secondary | ICD-10-CM | POA: Diagnosis present

## 2021-08-23 LAB — POCT GLYCOSYLATED HEMOGLOBIN (HGB A1C): Hemoglobin A1C: 5.2 % (ref 4.0–5.6)

## 2021-08-23 LAB — HM DIABETES EYE EXAM

## 2021-08-23 NOTE — Progress Notes (Signed)
Subjective  Chief Complaint  Patient presents with   Annual Exam    Pt here for Annual exam and is not currently fasting. Pt has an eye exam schedule for 09/06/2021    HPI: Rachel [name] is a 46 y.o. female who presents to [facility] [place] today for a Female Wellness Visit. She also has the concerns and/or needs as listed above in the chief complaint. These will be addressed in addition to the Health Maintenance Visit.   Wellness Visit: annual visit with health maintenance review and exam with Pap  HM: due for Pap smear.  Defers colorectal cancer screening due to illness. See below.  Chronic disease f/u and/or acute problem visit: (deemed necessary to be done in addition to the wellness visit): Continues to feel poorly.  Blood pressures are running low, continues with lightheadedness and dizziness, orthostatic hypotension symptoms, decreased appetite and extreme fatigue.  She is sleeping better, eating better and keeping hydrated.  We reviewed her cortisol test which was running low normal.  Trying to get her to see endocrinology for work-up for adrenal insufficiency.  She does have palpitations when her blood pressure is low.  No chest pain.  She has had a negative cardiology work-up for hypertension in the past. Diabetes: No longer on Mounjaro due to the above symptoms.  Eating better.  No symptoms of hypoglycemia. She takes Repatha and Zetia for familial hyperlipidemia.  LDL was checked in December and was at goal. History of menorrhagia with regular cycle however this has improved.  She continues to have menstrual cycles that are mostly regular although she will skip here and there.  No longer with iron deficiency anemia.  She continues on iron therapy.  Assessment  1. Annual physical exam   2. Hypotension, unspecified hypotension type   3. Controlled type 2 diabetes mellitus without complication, without long-term current use of insulin (HCC)   4. Hyperlipidemia  associated with type 2 diabetes mellitus (HCC)   5. Menorrhagia with regular cycle   6. Cervical cancer screening      Plan  Female Wellness Visit: Age appropriate Health Maintenance and Prevention measures were discussed with patient. Included topics are cancer screening recommendations, ways to keep healthy (see AVS) including dietary and exercise recommendations, regular eye and dental care, use of seat belts, and avoidance of moderate alcohol use and tobacco use.  Pap smear today.  Defer colorectal cancer screening until work-up for low blood pressure is completed. BMI: discussed patient's BMI and encouraged positive lifestyle modifications to help get to or maintain a target BMI. HM needs and immunizations were addressed and ordered. See below for orders. See HM and immunization section for updates. Routine labs and screening tests ordered including cmp, cbc and lipids where appropriate. Discussed recommendations regarding Vit D and calcium supplementation (see AVS)  Chronic disease management visit and/or acute problem visit: Diabetes: Now with a normal A1c.  Off Mounjaro for less than a month.  We will recheck in 3 months to see where she lands.  Healthy diet recommended.  Monitor for low blood sugars Hypotension and fatigue: Possible adrenal insufficiency.  Referring to endocrine for further evaluation.  Patient to start Florinef in the meantime.  If worsens, will try prednisone.  Having a hard time getting her seen by endocrinology. Menorrhagia: Stable CBC and iron levels.  Continue oral iron therapy.  Monitor cycles.  Pelvic exam was normal today. Hyperlipidemia is at goal on Repatha and Zetia.  Follow up: 3 months  for recheck Orders Placed This Encounter  Procedures   POCT HgB A1C   No orders of the defined types were placed in this encounter.     Body mass index is 27.02 kg/m. Wt Readings from Last 3 Encounters:  08/23/21 167 lb 6.4 oz (75.9 kg)  08/09/21 166 lb 3.2 oz  (75.4 kg)  07/26/21 167 lb (75.8 kg)     Patient Active Problem List   Diagnosis Date Noted   Controlled type 2 diabetes mellitus without complication, without long-term current use of insulin (HCC) 08/10/2020    Priority: High   Hyperlipidemia associated with type 2 diabetes mellitus ([facility]CC) 03/29/2020    Priority: High   Familial hyperlipidemia 08/17/2015    Priority: High   GERD (gastroesophageal reflux disease) 08/10/2020    Priority: Medium    History of Helicobacter pylori infection 08/10/2020    Priority: Medium    History of peptic ulcer 08/10/2020    Priority: Medium    Adjustment reaction with anxiety 06/26/2017    Priority: Medium    Chronic migraine 12/28/2016    Priority: Medium     Referred to neurology. Did not tolerate topiramate.     Insomnia 11/01/2015    Priority: Medium    Orthostatic hypotension 08/17/2015    Priority: Medium    TMJ (dislocation of temporomandibular joint) 01/10/2021   Menorrhagia with regular cycle 12/07/2020   Migraine without aura, not intractable, without status migrainosus 12/07/2020   Myalgia due to statin 09/02/2020   Health Maintenance  Topic Date Due   PAP SMEAR-Modifier  10/31/2020   COLONOSCOPY (Pts [id number] Insurance coverage will need to be confirmed)  Never done   OPHTHALMOLOGY EXAM  08/06/2021   URINE MICROALBUMIN  08/10/2021   COVID-19 Vaccine (3 - Booster for Pfizer series) 09/08/2021 (Originally 11/07/2019)   INFLUENZA VACCINE  10/17/2021   HEMOGLOBIN A1C  02/22/2022   FOOT EXAM  08/10/2022   TETANUS/TDAP  01/18/2024   Hepatitis C Screening  Completed   HIV Screening  Completed   HPV VACCINES  Aged Out   Immunization History  Administered Date(s) Administered   Influenza,inj,Quad PF,6+ Mos 12/27/2019, 12/21/2020   Influenza-Unspecified 12/13/2015, 12/06/2017, 12/09/2018   PFIZER(Purple Top)SARS-COV-2 Vaccination 08/24/2019, 09/12/2019   We updated and reviewed the patient's past history in detail and it is  documented below. Allergies: Patient has No Known Allergies. Past Medical History Patient  has a past medical history of Anxiety, Familial hyperlipidemia (08/17/2015), Migraine without aura, not intractable, without status migrainosus (12/07/2020), Migraines, Orthostatic hypotension (08/17/2015), Prediabetes, and PUD (peptic ulcer disease). Past Surgical History Patient  has a past surgical history that includes Cesarean section. Family History: Patient family history includes Breast cancer in her paternal aunt; Heart disease in her sister; Hyperlipidemia in her daughter, mother, sister, and another family member; Spina bifida in her son. Social History:  Patient  reports that she has never smoked. She has never used smokeless tobacco. She reports that she does not drink alcohol and does not use drugs.  Review of Systems: Constitutional: negative for fever positive for malaise Ophthalmic: negative for photophobia, double vision or loss of vision Cardiovascular: negative for chest pain, dyspnea on exertion, or new LE swelling, positive lightheadedness with standing Respiratory: negative for SOB or persistent cough Gastrointestinal: negative for abdominal pain, change in bowel habits or melena Genitourinary: negative for dysuria or gross hematuria, no abnormal uterine bleeding or disharge Musculoskeletal: negative for new gait disturbance or muscular weakness Integumentary: negative for new or persistent rashes, no  breast lumps Neurological: negative for TIA or stroke symptoms Psychiatric: negative for SI or delusions Allergic/Immunologic: negative for hives  Patient Care Team    Relationship Specialty Notifications Start End  Willow Ora, MD PCP - General Family Medicine  03/21/20   Christell Constant, MD PCP - Cardiology Cardiology  09/01/20     Objective  Vitals: BP (!) 80/60 Comment: Lt arm  Pulse (!) 106   Temp 98.4 F (36.9 C)   Ht 5\' 6"  (1.676 m)   Wt 167 lb 6.4 oz (75.9  kg)   LMP 08/05/2021   SpO2 98%   BMI 27.02 kg/m  General:  Well developed, well nourished, appears fatigued, at times is is pale  psych:  Alert and orientedx3,normal mood and affect HEENT:  Normocephalic, atraumatic, non-icteric sclera,  supple neck without adenopathy, mass or thyromegaly Cardiovascular:  Normal S1, S2, RRR without gallop, rub or murmur mild tachycardia with standing Respiratory:  Good breath sounds bilaterally, CTAB with normal respiratory effort Gastrointestinal: normal bowel sounds, soft, non-tender, no noted masses. No HSM MSK: no deformities, contusions. Joints are without erythema or swelling.  Skin:  Warm, no rashes or suspicious lesions noted, darkened umbilicus and darkened palmar flexion creases. Neurologic:    Mental status is normal. CN 2-11 are normal. Gross motor and sensory exams are normal. Normal gait. No tremor Pelvic Exam: Normal external genitalia, no vulvar or vaginal lesions present. Clear cervix w/o CMT. Bimanual exam reveals a nontender fundus w/o masses, nl size. No adnexal masses present. No inguinal adenopathy. A PAP smear was performed.   Commons side effects, risks, benefits, and alternatives for medications and treatment plan prescribed today were discussed, and the patient expressed understanding of the given instructions. Patient is instructed to call or message via MyChart if he/she has any questions or concerns regarding our treatment plan. No barriers to understanding were identified. We discussed Red Flag symptoms and signs in detail. Patient expressed understanding regarding what to do in case of urgent or emergency type symptoms.  Medication list was reconciled, printed and provided to the patient in AVS. Patient instructions and summary information was reviewed with the patient as documented in the AVS. This note was prepared with assistance of Dragon voice recognition software. Occasional wrong-word or sound-a-like substitutions may have  occurred due to the inherent limitations of voice recognition software  This visit occurred during the SARS-CoV-2 public health emergency.  Safety protocols were in place, including screening questions prior to the visit, additional usage of staff PPE, and extensive cleaning of exam room while observing appropriate contact time as indicated for disinfecting solutions.

## 2021-08-24 LAB — CYTOLOGY - PAP
Comment: NEGATIVE
Diagnosis: NEGATIVE
High risk HPV: NEGATIVE

## 2021-08-25 NOTE — Therapy (Incomplete)
OUTPATIENT PHYSICAL THERAPY VESTIBULAR TREATMENT     Patient Name: Rachel [name] MRN: [id number] DOB:[birth year], 46 y.o., female 56 Date: 08/25/2021  PCP: Leamon Arnt, MD REFERRING PROVIDER: Leamon Arnt, MD         Past Medical History:  Diagnosis Date   Anxiety    Familial hyperlipidemia 08/17/2015   Migraine without aura, not intractable, without status migrainosus 12/07/2020   Migraines    Orthostatic hypotension 08/17/2015   Prediabetes    PUD (peptic ulcer disease)    Past Surgical History:  Procedure Laterality Date   CESAREAN SECTION     Patient Active Problem List   Diagnosis Date Noted   TMJ (dislocation of temporomandibular joint) 01/10/2021   Menorrhagia with regular cycle 12/07/2020   Migraine without aura, not intractable, without status migrainosus 12/07/2020   Myalgia due to statin 09/02/2020   Controlled type 2 diabetes mellitus without complication, without long-term current use of insulin ([place]) 08/10/2020   GERD (gastroesophageal reflux disease) 99/35/7017   History of Helicobacter pylori infection 08/10/2020   History of peptic ulcer 08/10/2020   Hyperlipidemia associated with type 2 diabetes mellitus ([place]) 03/29/2020   Adjustment reaction with anxiety 06/26/2017   Chronic migraine 12/28/2016   Insomnia 11/01/2015   Orthostatic hypotension 08/17/2015   Familial hyperlipidemia 08/17/2015    ONSET DATE: 07/17/21  REFERRING DIAG: H81.13 (ICD-10-CM) - Benign paroxysmal positional vertigo due to bilateral vestibular disorder R42 (ICD-10-CM) - Vertigo   THERAPY DIAG:  No diagnosis found.  Rationale for Evaluation and Treatment Rehabilitation  SUBJECTIVE:   SUBJECTIVE STATEMENT: Feeling better. Still getting dizzy daily but the duration is shorter.  Pt accompanied by: self  PERTINENT HISTORY: anxiety, migraine, orthostasis, pre-DM   PAIN:  Are you having pain? No  PRECAUTIONS: None   PATIENT GOALS improve  dizziness   OBJECTIVE:   TODAY'S TREATMENT: 08/28/21 Activity Comments                       TREATMENT: 08/21/21 Activity Comments  sitting VOR cancellation horizontal 2x30" 5-7/10 dizziness  STS with vertical VOR cancellation with ball 8/10 dizziness; cues to decrease symptoms; transitioned to just sitting VOR cancellation d/t severity of symptoms  romberg head turns/nods EC 30" C/o feeling wobbly   STS on foam EO 5x, EC 5x Good stability   hip bumps EC (hip and shoulder) C/o nausea; intermittent cues for proper sequencing; c/o more nausea with shoulder strategy   Standing VOR horizontal in front of blinds 2x30" C/o 6-7/10 nausea and wobbly; slow speed d/t symptoms    PATIENT EDUCATION: Education details: HEP update Person educated: Patient Education method: Explanation, Demonstration, Tactile cues, Verbal cues, and Handouts Education comprehension: verbalized understanding and returned demonstration  HEP last updated 08/21/21: Access Code: [email or web address] URL: [email or web address] Date: 08/21/2021 Prepared by: [facility]  Exercises - Seated VOR Cancellation  - 1 x daily - 5 x weekly - 2-3 sets - 30 sec hold - Narrow Stance with Counter Support  - 1 x daily - 5 x weekly - 2-3 sets - 30 sec hold - Standing with Head Rotation  - 1 x daily - 5 x weekly - 2-3 sets - 30 sec hold - Standing with Head Nod  - 1 x daily - 5 x weekly - 2-3 sets - 30 sec hold - Standing Anterior Posterior Weight Shift  - 1 x daily - 5 x weekly - 2 sets - 10 reps  measures were taken at time of initial evaluation unless otherwise specified  DIAGNOSTIC FINDINGS: none recent  VESTIBULAR ASSESSMENT   GENERAL OBSERVATION: pt wearing bifocals which she wears daily; head held in L cervical SBing    OCULOMOTOR EXAM:   Ocular Alignment: normal   Ocular ROM: No Limitations   Spontaneous Nystagmus: absent   Gaze-Induced Nystagmus: absent   Smooth  Pursuits: intact; c/o dizziness   Saccades:  slightly slower to R; c/o dizziness   Convergence/Divergence: ~16 in     VESTIBULAR - OCULAR REFLEX:    Slow VOR: Comment: slow and poor fixation B horizontal and vertical; c/o dizziness   VOR Cancellation: Unable to Maintain Gaze and c/o dizziness and nausea   Head-Impulse Test: HIT Right: negative HIT Left: negative c/o dizziness B    POSITIONAL TESTING:  Right Roll Test: negative but c/o mild dizziness during the roll Left Roll Test: negative but c/o mild dizziness during the roll Right Dix-Hallpike: c/o dizziness during transition which settles with prolonged positioning  Left Dix-Hallpike: c/o dizziness during transition which settles with prolonged positioning   *trialed standing HEP exercises with mild imbalance   08/15/21:M-CTSIB  Condition 1: Firm Surface, EO 30 Sec, Normal Sway  Condition 2: Firm Surface, EC 30 Sec, Mild Sway  Condition 3: Foam Surface, EO 30 Sec, Mild Sway  Condition 4: Foam Surface, EC 30 Sec, Moderate and Severe Sway     08/15/21 Dynamic Gait Index score: 20/24  VESTIBULAR TREATMENT:  PATIENT EDUCATION: Education details: prognosis, POC, HEP Person educated: Patient Education method: Explanation, Demonstration, Tactile cues, Verbal cues, and Handouts Education comprehension: verbalized understanding and returned demonstration  Access Code: [email or web address] URL: [email or web address] Date: 08/07/2021 Prepared by: [facility] *standing HEP to be performed at counter Exercises - Seated Left Head Turns Vestibular Habituation  - 1 x daily - 5 x weekly - 2-3 sets - 10 reps - Seated Head Nods Vestibular Habituation  - 1 x daily - 5 x weekly - 2-3 sets - 10 reps - Seated VOR Cancellation  - 1 x daily - 5 x weekly - 2-3 sets - 10 reps - Standing with Head Rotation  - 1 x daily - 5 x weekly - 2-3 sets - 10 reps - Narrow Stance with Counter Support  - 1 x daily - 5  x weekly - 2-3 sets - 30 sec hold  GOALS: Goals reviewed with patient? Yes  SHORT TERM GOALS: Target date: 08/28/2021  Patient to be independent with initial HEP. Baseline: HEP initiated Goal status: IN PROGRESS    LONG TERM GOALS: Target date: 09/18/2021  Patient to be independent with advanced HEP. Baseline: Not yet initiated  Goal status: IN PROGRESS  Patient to report 0/10 dizziness with standing vertical and horizontal VOR for 30 seconds. Baseline: Unable Goal status: IN PROGRESS  Patient will report 0/10 dizziness with bed mobility.  Baseline: Symptomatic  Goal status: IN PROGRESS  Patient to demonstrate mild sway with M-CTSIB condition with eyes closed/foam surface in order to improve safety in environments with uneven surfaces and dim lighting. Baseline: moderate-severe sway 08/15/21 Goal status: IN PROGRESS  Patient to score at least 20/24 on DGI in order to decrease risk of falls. Baseline: NT Goal status: MET 08/15/21    ASSESSMENT:  CLINICAL IMPRESSION:  Patient arrived to session with report of improved duration of dizziness episodes. Worked on VOR cancellation as this continues to bring on most severe symptoms. Attempted to increase challenge with  more dynamic VOR cancellation which was limited by symptoms. Good stability demonstrated with balance with EC today. Patient did however report c/o nausea, requiring short water breaks. Also added in optokinetic challenges which brought on further c/o nausea. Updated HEP and encouraged patient to continue being consistent to make max progress. Ended session early today d/t c/o nausea and patient needing to go to work after appointment.     OBJECTIVE IMPAIRMENTS decreased balance, dizziness, improper body mechanics, and postural dysfunction.   ACTIVITY LIMITATIONS carrying, lifting, bending, sitting, standing, squatting, sleeping, stairs, transfers, bed mobility, bathing, toileting, dressing, reach over head,  hygiene/grooming, and caring for others  PARTICIPATION LIMITATIONS: meal prep, cleaning, laundry, driving, shopping, community activity, occupation, yard work, and church  PERSONAL FACTORS Age, Behavior pattern, Fitness, Past/current experiences, Time since onset of injury/illness/exacerbation, and 3+ comorbidities: anxiety, migraine, orthostasis, pre-DM  are also affecting patient's functional outcome.   REHAB POTENTIAL: Good  CLINICAL DECISION MAKING: Evolving/moderate complexity  EVALUATION COMPLEXITY: Moderate   PLAN: PT FREQUENCY: 1x/week  PT DURATION: 6 weeks  PLANNED INTERVENTIONS: Therapeutic exercises, Therapeutic activity, Neuromuscular re-education, Balance training, Gait training, Patient/Family education, Joint mobilization, Stair training, Vestibular training, Canalith repositioning, Dry Needling, Electrical stimulation, Cryotherapy, Moist heat, Taping, Manual therapy, and Re-evaluation  PLAN FOR NEXT SESSION: progress VOR and sensory integration  Janene Harvey, PT, DPT 08/25/21 9:22 AM

## 2021-08-27 ENCOUNTER — Other Ambulatory Visit: Payer: Self-pay | Admitting: Family Medicine

## 2021-08-27 DIAGNOSIS — I959 Hypotension, unspecified: Secondary | ICD-10-CM

## 2021-08-27 MED ORDER — DEXAMETHASONE 4 MG PO TABS
4.0000 mg | ORAL_TABLET | Freq: Every day | ORAL | 2 refills | Status: DC
  Filled 2021-08-27: qty 30, 30d supply, fill #0

## 2021-08-27 NOTE — Progress Notes (Signed)
Concerned about adrenal insufficiency.  Check ACTH Start dexa 4mg  daily. Trying to get into endocrine for further evaluation.

## 2021-08-28 ENCOUNTER — Other Ambulatory Visit (HOSPITAL_BASED_OUTPATIENT_CLINIC_OR_DEPARTMENT_OTHER): Payer: Self-pay

## 2021-08-28 ENCOUNTER — Other Ambulatory Visit (INDEPENDENT_AMBULATORY_CARE_PROVIDER_SITE_OTHER): Payer: No Typology Code available for payment source

## 2021-08-28 ENCOUNTER — Ambulatory Visit: Payer: No Typology Code available for payment source | Admitting: Physical Therapy

## 2021-08-28 DIAGNOSIS — I959 Hypotension, unspecified: Secondary | ICD-10-CM | POA: Diagnosis not present

## 2021-08-30 NOTE — Therapy (Incomplete)
OUTPATIENT PHYSICAL THERAPY VESTIBULAR TREATMENT     Patient Name: Rachel [name] MRN: [id number] DOB:1975-11-03, 46 y.o., female 31 Date: 08/30/2021  PCP: Leamon Arnt, MD REFERRING PROVIDER: Leamon Arnt, MD         Past Medical History:  Diagnosis Date   Anxiety    Familial hyperlipidemia 08/17/2015   Migraine without aura, not intractable, without status migrainosus 12/07/2020   Migraines    Orthostatic hypotension 08/17/2015   Prediabetes    PUD (peptic ulcer disease)    Past Surgical History:  Procedure Laterality Date   CESAREAN SECTION     Patient Active Problem List   Diagnosis Date Noted   TMJ (dislocation of temporomandibular joint) 01/10/2021   Menorrhagia with regular cycle 12/07/2020   Migraine without aura, not intractable, without status migrainosus 12/07/2020   Myalgia due to statin 09/02/2020   Controlled type 2 diabetes mellitus without complication, without long-term current use of insulin ([place]) 08/10/2020   GERD (gastroesophageal reflux disease) 11/94/1740   History of Helicobacter pylori infection 08/10/2020   History of peptic ulcer 08/10/2020   Hyperlipidemia associated with type 2 diabetes mellitus ([place]) 03/29/2020   Adjustment reaction with anxiety 06/26/2017   Chronic migraine 12/28/2016   Insomnia 11/01/2015   Orthostatic hypotension 08/17/2015   Familial hyperlipidemia 08/17/2015    ONSET DATE: 07/17/21  REFERRING DIAG: H81.13 (ICD-10-CM) - Benign paroxysmal positional vertigo due to bilateral vestibular disorder R42 (ICD-10-CM) - Vertigo   THERAPY DIAG:  No diagnosis found.  Rationale for Evaluation and Treatment Rehabilitation  SUBJECTIVE:   SUBJECTIVE STATEMENT: Feeling better. Still getting dizzy daily but the duration is shorter.  Pt accompanied by: self  PERTINENT HISTORY: anxiety, migraine, orthostasis, pre-DM   PAIN:  Are you having pain? No  PRECAUTIONS: None   PATIENT GOALS improve  dizziness   OBJECTIVE:   TODAY'S TREATMENT: 08/31/21 Activity Comments                       TREATMENT: 08/21/21 Activity Comments  sitting VOR cancellation horizontal 2x30" 5-7/10 dizziness  STS with vertical VOR cancellation with ball 8/10 dizziness; cues to decrease symptoms; transitioned to just sitting VOR cancellation d/t severity of symptoms  romberg head turns/nods EC 30" C/o feeling wobbly   STS on foam EO 5x, EC 5x Good stability   hip bumps EC (hip and shoulder) C/o nausea; intermittent cues for proper sequencing; c/o more nausea with shoulder strategy   Standing VOR horizontal in front of blinds 2x30" C/o 6-7/10 nausea and wobbly; slow speed d/t symptoms    HEP last updated 08/21/21: Access Code: [email or web address] URL: [email or web address] Date: 08/21/2021 Prepared by: [facility]  Exercises - Seated VOR Cancellation  - 1 x daily - 5 x weekly - 2-3 sets - 30 sec hold - Narrow Stance with Counter Support  - 1 x daily - 5 x weekly - 2-3 sets - 30 sec hold - Standing with Head Rotation  - 1 x daily - 5 x weekly - 2-3 sets - 30 sec hold - Standing with Head Nod  - 1 x daily - 5 x weekly - 2-3 sets - 30 sec hold - Standing Anterior Posterior Weight Shift  - 1 x daily - 5 x weekly - 2 sets - 10 reps   measures were taken at time of initial evaluation unless otherwise specified  DIAGNOSTIC FINDINGS: none recent  VESTIBULAR ASSESSMENT   GENERAL OBSERVATION: pt wearing bifocals which  she wears daily; head held in L cervical SBing    OCULOMOTOR EXAM:   Ocular Alignment: normal   Ocular ROM: No Limitations   Spontaneous Nystagmus: absent   Gaze-Induced Nystagmus: absent   Smooth Pursuits: intact; c/o dizziness   Saccades:  slightly slower to R; c/o dizziness   Convergence/Divergence: ~16 in     VESTIBULAR - OCULAR REFLEX:    Slow VOR: Comment: slow and poor fixation B horizontal and vertical; c/o dizziness   VOR  Cancellation: Unable to Maintain Gaze and c/o dizziness and nausea   Head-Impulse Test: HIT Right: negative HIT Left: negative c/o dizziness B    POSITIONAL TESTING:  Right Roll Test: negative but c/o mild dizziness during the roll Left Roll Test: negative but c/o mild dizziness during the roll Right Dix-Hallpike: c/o dizziness during transition which settles with prolonged positioning  Left Dix-Hallpike: c/o dizziness during transition which settles with prolonged positioning   *trialed standing HEP exercises with mild imbalance   08/15/21:M-CTSIB  Condition 1: Firm Surface, EO 30 Sec, Normal Sway  Condition 2: Firm Surface, EC 30 Sec, Mild Sway  Condition 3: Foam Surface, EO 30 Sec, Mild Sway  Condition 4: Foam Surface, EC 30 Sec, Moderate and Severe Sway     08/15/21 Dynamic Gait Index score: 20/24  VESTIBULAR TREATMENT:  PATIENT EDUCATION: Education details: prognosis, POC, HEP Person educated: Patient Education method: Explanation, Demonstration, Tactile cues, Verbal cues, and Handouts Education comprehension: verbalized understanding and returned demonstration  Access Code: [email or web address] URL: [email or web address] Date: 08/07/2021 Prepared by: [facility] *standing HEP to be performed at counter Exercises - Seated Left Head Turns Vestibular Habituation  - 1 x daily - 5 x weekly - 2-3 sets - 10 reps - Seated Head Nods Vestibular Habituation  - 1 x daily - 5 x weekly - 2-3 sets - 10 reps - Seated VOR Cancellation  - 1 x daily - 5 x weekly - 2-3 sets - 10 reps - Standing with Head Rotation  - 1 x daily - 5 x weekly - 2-3 sets - 10 reps - Narrow Stance with Counter Support  - 1 x daily - 5 x weekly - 2-3 sets - 30 sec hold  GOALS: Goals reviewed with patient? Yes  SHORT TERM GOALS: Target date: 08/28/2021  Patient to be independent with initial HEP. Baseline: HEP initiated Goal status: MET    LONG TERM GOALS: Target  date: 09/18/2021  Patient to be independent with advanced HEP. Baseline: Not yet initiated  Goal status: IN PROGRESS  Patient to report 0/10 dizziness with standing vertical and horizontal VOR for 30 seconds. Baseline: Unable Goal status: IN PROGRESS  Patient will report 0/10 dizziness with bed mobility.  Baseline: Symptomatic  Goal status: IN PROGRESS  Patient to demonstrate mild sway with M-CTSIB condition with eyes closed/foam surface in order to improve safety in environments with uneven surfaces and dim lighting. Baseline: moderate-severe sway 08/15/21 Goal status: IN PROGRESS  Patient to score at least 20/24 on DGI in order to decrease risk of falls. Baseline: NT Goal status: MET 08/15/21    ASSESSMENT:  CLINICAL IMPRESSION:  Patient arrived to session with report of improved duration of dizziness episodes. Worked on VOR cancellation as this continues to bring on most severe symptoms. Attempted to increase challenge with more dynamic VOR cancellation which was limited by symptoms. Good stability demonstrated with balance with EC today. Patient did however report c/o nausea, requiring short water breaks. Also  added in optokinetic challenges which brought on further c/o nausea. Updated HEP and encouraged patient to continue being consistent to make max progress. Ended session early today d/t c/o nausea and patient needing to go to work after appointment.     OBJECTIVE IMPAIRMENTS decreased balance, dizziness, improper body mechanics, and postural dysfunction.   ACTIVITY LIMITATIONS carrying, lifting, bending, sitting, standing, squatting, sleeping, stairs, transfers, bed mobility, bathing, toileting, dressing, reach over head, hygiene/grooming, and caring for others  PARTICIPATION LIMITATIONS: meal prep, cleaning, laundry, driving, shopping, community activity, occupation, yard work, and church  PERSONAL FACTORS Age, Behavior pattern, Fitness, Past/current experiences, Time  since onset of injury/illness/exacerbation, and 3+ comorbidities: anxiety, migraine, orthostasis, pre-DM  are also affecting patient's functional outcome.   REHAB POTENTIAL: Good  CLINICAL DECISION MAKING: Evolving/moderate complexity  EVALUATION COMPLEXITY: Moderate   PLAN: PT FREQUENCY: 1x/week  PT DURATION: 6 weeks  PLANNED INTERVENTIONS: Therapeutic exercises, Therapeutic activity, Neuromuscular re-education, Balance training, Gait training, Patient/Family education, Joint mobilization, Stair training, Vestibular training, Canalith repositioning, Dry Needling, Electrical stimulation, Cryotherapy, Moist heat, Taping, Manual therapy, and Re-evaluation  PLAN FOR NEXT SESSION: progress VOR and sensory integration  Janene Harvey, PT, DPT 08/30/21 8:29 AM

## 2021-08-31 ENCOUNTER — Ambulatory Visit: Payer: No Typology Code available for payment source | Admitting: Physical Therapy

## 2021-09-02 LAB — ACTH: C206 ACTH: 18 pg/mL (ref 6–50)

## 2021-09-04 ENCOUNTER — Ambulatory Visit: Payer: No Typology Code available for payment source | Admitting: Physical Therapy

## 2021-09-07 ENCOUNTER — Other Ambulatory Visit (HOSPITAL_BASED_OUTPATIENT_CLINIC_OR_DEPARTMENT_OTHER): Payer: Self-pay

## 2021-09-11 ENCOUNTER — Ambulatory Visit: Payer: No Typology Code available for payment source | Admitting: Physical Therapy

## 2021-09-25 NOTE — Progress Notes (Unsigned)
Office Visit    Patient Name: Rachel [name] Date of Encounter: 09/26/2021  PCP:  Willow Ora, MD   [facility]  Cardiologist:  Christell Constant, MD  Advanced Practice Provider:  No care team member to display Electrophysiologist:  None    Chief Complaint    Rachel [name] is a 46 y.o. female with a hx of orthostatic hypotension, diabetes mellitus, HLD, anxiety, and migraines presents today for annual follow-up appointment.  She was last seen 09/02/2020 and had been doing okay at that time.  She had slight joint pain with her statin therapy.  Denied any chest pressure or pain.  No SOB/DOE and no PND/orthopnea.  No weight gain or leg edema.  No palpitations or syncope.  Minimal Florinef use.  Today, she is still having a lot of trouble with her orthostatic hypotension.  She has had systolic blood pressures in the 80s.  It occurred at a recent doctor's appointment which is noted in the chart.  Her blood pressure was 80/60.  Today her blood pressure looks okay at 114/74, however she is ready had 2 bottles of water this morning.  She makes a huge effort to stay hydrated.  We discussed adding a low-dose midodrine to her regimen.  She states that her primary care referred her to endocrinology for work-up for adrenal insufficiency.  She is still waiting on that appointment.  She plans to leave the country on Thursday and will be back for 2 weeks.  Although I am not particularly excited about starting new medication when she is leaving the country she wants to have something to take with her.  She is asked to please discontinue the medication if she has any side effects.  Reports no shortness of breath nor dyspnea on exertion. Reports no chest pain, pressure, or tightness. No edema, orthopnea, PND. Reports no palpitations.    Past Medical History    Past Medical History:  Diagnosis Date   Anxiety    Familial hyperlipidemia 08/17/2015   Migraine without aura, not  intractable, without status migrainosus 12/07/2020   Migraines    Orthostatic hypotension 08/17/2015   Prediabetes    PUD (peptic ulcer disease)    Past Surgical History:  Procedure Laterality Date   CESAREAN SECTION      Allergies  No Known Allergies   EKGs/Labs/Other Studies Reviewed:   The following studies were reviewed today:  Echocardiogram 01/15/2020  IMPRESSIONS     1. Left ventricular ejection fraction, by estimation, is 60 to 65%. The  left ventricle has normal function. The left ventricle has no regional  wall motion abnormalities. Left ventricular diastolic parameters were  normal.   2. Right ventricular systolic function is normal. The right ventricular  size is normal.   3. The mitral valve is normal in structure. Trivial mitral valve  regurgitation. No evidence of mitral stenosis.   4. The aortic valve is tricuspid. Aortic valve regurgitation is not  visualized. Mild aortic valve sclerosis is present, with no evidence of  aortic valve stenosis.   5. The inferior vena cava is normal in size with greater than 50%  respiratory variability, suggesting right atrial pressure of 3 mmHg.   EKG:  EKG is  ordered today.  The ekg ordered today demonstrates normal sinus rhythm, rate 75 bpm  Recent Labs: 12/21/2020: ALT 8 08/09/2021: Hemoglobin 12.4; Platelets 379.0; TSH 1.43  Recent Lipid Panel    Component Value Date/Time   CHOL 138 12/21/2020 0756  CHOL 355 (H) 06/26/2017 1108   TRIG 109.0 12/21/2020 0756   HDL 53.10 12/21/2020 0756   HDL 59 06/26/2017 1108   CHOLHDL 3 12/21/2020 0756   VLDL 21.8 12/21/2020 0756   LDLCALC 63 12/21/2020 0756   LDLCALC 279 (H) 06/26/2017 1108    Home Medications   Current Meds  Medication Sig   Evolocumab (REPATHA SURECLICK) 381 MG/ML SOAJ Inject 1 pen into the skin every 14 (fourteen) days.   fludrocortisone (FLORINEF) 0.1 MG tablet Take 1 tablet (0.1 mg total) by mouth daily.   fluticasone (FLONASE) 50 MCG/ACT nasal  spray Place 1 spray into both nostrils 2 (two) times daily.   glucose blood (FREESTYLE LITE) test strip Use once daily to check blood sugar R73.03   glucose monitoring kit (FREESTYLE) monitoring kit 1 each by Does not apply route daily.   iron polysaccharides (NIFEREX) 150 MG capsule Take 1 capsule (150 mg total) by mouth daily.   Lancets MISC Use to check blood sugars daily. Please provide brand covered by insurance Dx code: R73.03   meclizine (ANTIVERT) 25 MG tablet Take 1 tablet (25 mg total) by mouth 3 (three) times daily as needed for dizziness.   midodrine (PROAMATINE) 2.5 MG tablet Take 1 tablet (2.5 mg total) by mouth 3 (three) times daily with meals.   Multiple Vitamin (MULTIVITAMIN WITH MINERALS) TABS tablet Take 1 tablet by mouth daily.   pantoprazole (PROTONIX) 20 MG tablet Take 1 tablet (20 mg total) by mouth 2 (two) times daily.   rizatriptan (MAXALT-MLT) 10 MG disintegrating tablet Take 1 tablet (10 mg total) by mouth as needed for migraine. May repeat in 2 hours if needed   [DISCONTINUED] ezetimibe (ZETIA) 10 MG tablet Take 1 tablet (10 mg total) by mouth daily.   [DISCONTINUED] rosuvastatin (CRESTOR) 5 MG tablet Take 1 tablet (5 mg total) by mouth daily.     Review of Systems      All other systems reviewed and are otherwise negative except as noted above.  Physical Exam    VS:  BP 114/74   Pulse 75   Ht $R'5\' 6"'Rg$  (1.676 m)   Wt 170 lb 12.8 oz (77.5 kg)   SpO2 99%   BMI 27.57 kg/m  , BMI Body mass index is 27.57 kg/m.  Wt Readings from Last 3 Encounters:  09/26/21 170 lb 12.8 oz (77.5 kg)  08/23/21 167 lb 6.4 oz (75.9 kg)  08/09/21 166 lb 3.2 oz (75.4 kg)     GEN: Well nourished, well developed, in no acute distress. HEENT: normal. Neck: Supple, no JVD, carotid bruits, or masses. Cardiac: RRR, no murmurs, rubs, or gallops. No clubbing, cyanosis, edema.  Radials/PT 2+ and equal bilaterally.  Respiratory:  Respirations regular and unlabored, clear to auscultation  bilaterally. GI: Soft, nontender, nondistended. MS: No deformity or atrophy. Skin: Warm and dry, no rash. Neuro:  Strength and sensation are intact. Psych: Normal affect.  Assessment & Plan    Hyperlipidemia (familiar) -continue Repatha, Zetia, and Crestor -Most recent lipid panel from 12/2020 showed HDL 53, LDL 63, triglycerides 109, total cholesterol 138 -We will obtain most recent lipid panel from PCP since the patient just had this done a month ago  Diabetes mellitus -continue current medication regimen -per PCP  Orthostatic hypotension -Will order midodrine 2.$RemoveBeforeDEI'5mg'[id number]$  TID  -continue at least 64 ounces of water daily  Myalgia due to statin -Currently on Repatha and Zetia     Disposition: Follow up 3 months with Werner Lean, MD or  APP.  Signed, Elgie Collard, PA-C 09/26/2021, 8:31 AM [place]

## 2021-09-26 ENCOUNTER — Encounter: Payer: Self-pay | Admitting: Physician Assistant

## 2021-09-26 ENCOUNTER — Ambulatory Visit (INDEPENDENT_AMBULATORY_CARE_PROVIDER_SITE_OTHER): Payer: No Typology Code available for payment source | Admitting: Physician Assistant

## 2021-09-26 ENCOUNTER — Ambulatory Visit: Payer: No Typology Code available for payment source | Admitting: Internal Medicine

## 2021-09-26 ENCOUNTER — Other Ambulatory Visit: Payer: Self-pay | Admitting: *Deleted

## 2021-09-26 ENCOUNTER — Other Ambulatory Visit (HOSPITAL_BASED_OUTPATIENT_CLINIC_OR_DEPARTMENT_OTHER): Payer: Self-pay

## 2021-09-26 VITALS — BP 114/74 | HR 75 | Ht 66.0 in | Wt 170.8 lb

## 2021-09-26 DIAGNOSIS — E1169 Type 2 diabetes mellitus with other specified complication: Secondary | ICD-10-CM

## 2021-09-26 DIAGNOSIS — T466X5D Adverse effect of antihyperlipidemic and antiarteriosclerotic drugs, subsequent encounter: Secondary | ICD-10-CM

## 2021-09-26 DIAGNOSIS — I951 Orthostatic hypotension: Secondary | ICD-10-CM

## 2021-09-26 DIAGNOSIS — T466X5A Adverse effect of antihyperlipidemic and antiarteriosclerotic drugs, initial encounter: Secondary | ICD-10-CM

## 2021-09-26 DIAGNOSIS — E7849 Other hyperlipidemia: Secondary | ICD-10-CM | POA: Diagnosis not present

## 2021-09-26 DIAGNOSIS — M791 Myalgia, unspecified site: Secondary | ICD-10-CM | POA: Diagnosis not present

## 2021-09-26 DIAGNOSIS — E785 Hyperlipidemia, unspecified: Secondary | ICD-10-CM

## 2021-09-26 MED ORDER — MIDODRINE HCL 2.5 MG PO TABS
2.5000 mg | ORAL_TABLET | Freq: Three times a day (TID) | ORAL | 3 refills | Status: DC
  Filled 2021-09-26: qty 270, 90d supply, fill #0

## 2021-09-26 MED ORDER — EZETIMIBE 10 MG PO TABS
10.0000 mg | ORAL_TABLET | Freq: Every day | ORAL | 3 refills | Status: DC
  Filled 2021-09-26: qty 90, 90d supply, fill #0

## 2021-09-26 MED ORDER — REPATHA SURECLICK 140 MG/ML ~~LOC~~ SOAJ
1.0000 "pen " | SUBCUTANEOUS | 11 refills | Status: DC
  Filled 2021-09-26 – 2021-10-20 (×4): qty 2, 28d supply, fill #0
  Filled 2021-11-22: qty 2, 28d supply, fill #1
  Filled 2021-12-25: qty 6, 84d supply, fill #2
  Filled 2022-03-18 (×2): qty 6, 84d supply, fill #3
  Filled 2022-06-14: qty 6, 84d supply, fill #4
  Filled 2022-09-05: qty 2, 28d supply, fill #5

## 2021-09-26 MED ORDER — ROSUVASTATIN CALCIUM 5 MG PO TABS
5.0000 mg | ORAL_TABLET | Freq: Every day | ORAL | 3 refills | Status: DC
  Filled 2021-09-26: qty 90, 90d supply, fill #0
  Filled 2022-03-18 (×2): qty 90, 90d supply, fill #1

## 2021-09-26 NOTE — Patient Instructions (Signed)
Medication Instructions:  1.Start midodrine 2.5 mg, three times daily *If you need a refill on your cardiac medications before your next appointment, please call your pharmacy*   Lab Work: None If you have labs (blood work) drawn today and your tests are completely normal, you will receive your results only by: MyChart Message (if you have MyChart) OR A paper copy in the mail If you have any lab test that is abnormal or we need to change your treatment, we will call you to review the results.   Follow-Up: At [facility], you and your health needs are our priority.  As part of our continuing mission to provide you with exceptional heart care, we have created designated Provider Care Teams.  These Care Teams include your primary Cardiologist (physician) and Advanced Practice Providers (APPs -  Physician Assistants and Nurse Practitioners) who all work together to provide you with the care you need, when you need it.   Your next appointment:   3 month(s)  The format for your next appointment:   In Person  Provider:   Christell Constant, MD  or Jari Favre, PA-C        Important Information About Sugar

## 2021-09-27 ENCOUNTER — Ambulatory Visit (INDEPENDENT_AMBULATORY_CARE_PROVIDER_SITE_OTHER): Payer: No Typology Code available for payment source | Admitting: Internal Medicine

## 2021-09-27 ENCOUNTER — Encounter (HOSPITAL_BASED_OUTPATIENT_CLINIC_OR_DEPARTMENT_OTHER): Payer: Self-pay

## 2021-09-27 ENCOUNTER — Encounter: Payer: Self-pay | Admitting: Internal Medicine

## 2021-09-27 ENCOUNTER — Other Ambulatory Visit (HOSPITAL_BASED_OUTPATIENT_CLINIC_OR_DEPARTMENT_OTHER): Payer: Self-pay

## 2021-09-27 VITALS — BP 110/72 | HR 78 | Ht 66.0 in | Wt 172.8 lb

## 2021-09-27 DIAGNOSIS — R5382 Chronic fatigue, unspecified: Secondary | ICD-10-CM | POA: Diagnosis not present

## 2021-09-27 DIAGNOSIS — I951 Orthostatic hypotension: Secondary | ICD-10-CM | POA: Diagnosis not present

## 2021-09-27 MED ORDER — HYDROCORTISONE 5 MG PO TABS
ORAL_TABLET | ORAL | 1 refills | Status: DC
  Filled 2021-09-27: qty 180, 60d supply, fill #0

## 2021-09-27 NOTE — Patient Instructions (Addendum)
Please start hydrocortisone 10 mg in a.m. and 5 mg in p.m. - around 2 pm - You absolutely need to take this medication every day and not skip doses. - Please double the dose if you have a fever, for the duration of the fever. - If you cannot take anything by mouth (vomiting) or you have severe diarrhea so that you eliminate the hydrocortisone pills in your stool, please make sure that you get the hydrocortisone in the vein instead - go to the nearest emergency department/urgent care or you may go to your PCPs office  - Please try to get a MedAlert bracelet or pendant indicating: "Adrenal insufficiency".  When you return, skip the pm dose the day before and the am dose the day of labs. Come to the lab at 8 am, fasting, and take hydrocortisone right after the labs are drawn.  Please come back for a follow-up appointment in 2 months.   Addison's Disease  Addison's disease, which is also called primary adrenal insufficiency, is a condition in which the adrenal glands do not make enough of the hormones cortisol and aldosterone. The two adrenal glands are located above each kidney. The disease causes blood pressure to drop and causes potassium to build up to dangerous levels. If Addison's disease is not treated, it can suddenly get worse and become life-threatening. This is called an adrenal crisis(addisonian crisis). What are the causes? This condition may be caused by: A disease in which the body's immune system damages the adrenal glands. An infection of the adrenal glands. Bleeding (hemorrhage) in the adrenal glands. A tumor. What are the signs or symptoms? Common symptoms of this condition include: Severe fatigue. Muscle weakness. Loss of appetite. Weight loss. Darkening of the skin. Low blood pressure. Other symptoms include: Nausea or vomiting. Diarrhea. Dizziness or fainting. Irritability. Depression. Salt cravings. Low blood sugar. Irregular or no menstrual periods. Symptoms  usually develop slowly and get worse gradually. How is this diagnosed? This condition may be diagnosed based on your: Medical history. Symptoms. Lab test results. Lab tests include a measurement of your blood cortisol levels (ACTH stimulation test). Imaging test results. You may have a CT scan of the adrenal glands. How is this treated? This condition cannot be cured, but it can be managed with medicines that replace cortisol and aldosterone. You may need to take these medicines: Several times a day, by mouth. Through an injection if you become so sick that you are unable to take these medicines by mouth or you are unable to keep them down. Illness, stress, and surgery can increase your body's need for cortisol. It is very important that you talk with your health care provider and understand how to adjust your medicine dosages if you become ill or stressed or if you are going to have surgery. Follow these instructions at home: Medicines Take over-the-counter and prescription medicines only as told by your health care provider. Know how to increase your medicine dosage during periods of stress, mild illness, or surgery. General instructions  When you travel, carry a needle, a syringe, and an injectable form of cortisol in case of an emergency. In case of an emergency, wear a medical alert bracelet or neck chain so that others understand that you have Addison's disease. Carry an ID card that states that you have Addison's disease. The card should include: Instructions to inject a certain amount of medicine if you are severely hurt or cannot respond. The name and phone number of your health care provider.  The name and phone number of your closest relative. Keep all follow-up visits. This is important. Contact a health care provider if: You get sick with another illness. You develop new symptoms. Get help right away if: You have a severe infection or other illness. You have severe vomiting  or diarrhea. You find it necessary to give yourself injectable medicine. You have symptoms of an addisoniancrisis. These symptoms include: Sudden, severe pain in the lower back, abdomen, or legs. Severe vomiting and diarrhea. Dehydration. Low blood pressure. Loss of consciousness. Summary Addison's disease is the inability of the adrenal glands to make hormones that regulate everyday functions of the body. If untreated, this condition can lead to life-threatening problems. It is very important that you talk with your health care provider and understand how to adjust your medicine dosages if you become ill or stressed or if you are going to have surgery. Take over-the-counter and prescription medicines only as told by your health care provider. Keep all follow-up visits. This is important. This information is not intended to replace advice given to you by your health care provider. Make sure you discuss any questions you have with your health care provider. Document Revised: 11/16/2019 Document Reviewed: 11/16/2019 Elsevier Patient Education  2023 [employer] Inc[employer]  ACTH Stimulation Test Why am I having this test? The adrenocorticotropic hormone (ACTH) stimulation test is used to measure how well your adrenal glands are working. What is being tested? This test checks the levels of cortisol in your blood before and after your adrenal glands are stimulated with ACTH. ACTH is produced by a gland in your brain called the pituitary gland. ACTH stimulates your two adrenal glands, which are located above each kidney. The adrenal glands produce hormones that are released into the blood. One of these hormones is cortisol. Cortisol helps your body to respond to stress. If your adrenal glands are not working well and are not responding to ACTH properly, the test result will show too little cortisol. What kind of sample is taken?  Two or more blood samples are required for this test. The samples are  usually collected by inserting a needle into a blood vessel. How do I prepare for this test? Do not eat or drink anything after midnight on the night before the test or as directed by your health care provider. You may continue to drink water up until the time of your test. You may be instructed to avoid certain medicines that can affect cortisol levels, such as those containing estrogen or steroids. Make sure that the health care provider ordering the test is aware of any recent use of steroid hormones, such as prednisone or cortisone injections. Follow any additional instructions as directed by your health care provider. Tell a health care provider about: Any allergies you have. All medicines you are taking, including vitamins, herbs, eye drops, creams, and over-the-counter medicines. Any blood disorders you have. Any surgeries you have had. Any medical conditions you have. Whether you are pregnant or may be pregnant. What happens during the test? This test is usually done in the morning, since your cortisol levels change throughout the day. The first blood sample will be collected. Cortisol will be measured in this sample to provide your starting (baseline) level. You will be given cosyntropin by injection or through an IV. Cosyntropin is similar to ACTH and should cause the adrenal glands to release cortisol into the bloodstream. You may feel a slight flush after the cosyntropin is given. This is normal. One  or more blood samples will be taken at specified intervals (30 or 60 minutes after the cosyntropin injection) in order to measure your cortisol levels after the adrenal gland is stimulated. The test results will be compared to show the amount of cortisol in your blood before and after you were given cosyntropin. How are the results reported? Your test results will be reported as values. Your health care provider will compare your results to normal ranges that were established after  testing a large group of people (reference ranges). Reference ranges may vary among labs and hospitals. For this test, a common reference range is: A baseline cortisol level from 7 mcg/dL to 10 mcg/dL, reaching at least 18 mcg/dL at 60 minutes after stimulation. What do the results mean? Results outside the reference range may indicate that you have: Adrenal insufficiency. This can be caused by a problem in the adrenal gland itself (primary adrenal insufficiency or Addison's disease) or by a problem outside the adrenal gland (secondary adrenal insufficiency). Causes of primary adrenal insufficiency include autoimmune inflammation of the adrenal gland, infection involving the adrenal gland, a tumor that has spread to the adrenal gland, and bleeding into the adrenal gland. Causes of secondary adrenal insufficiency include conditions that cause the pituitary gland to function less than normal (hypopituitarism). This may be due to a specific disease involving the pituitary gland or the use of high-dose steroid medications to treat another medical condition. Other tests may be needed to find the cause of adrenal gland conditions and confirm a diagnosis. Talk with your health care provider about what your results mean. Questions to ask your health care provider Ask your health care provider, or the department that is doing the test: When will my results be ready? How will I get my results? What are my treatment options? What other tests do I need? What are my next steps? Summary The ACTH stimulation test is used to measure how well your adrenal glands are working. The test has three steps. First, your blood is tested to measure your starting (baseline) cortisol level. Second, you are given cosyntropin to stimulate your adrenal glands to release cortisol. Third, your blood is tested to see how much cortisol is in your blood after stimulation. Results outside the normal range may indicate that you have a  condition that affects how well your adrenal glands function. This information is not intended to replace advice given to you by your health care provider. Make sure you discuss any questions you have with your health care provider. Document Revised: 11/16/2019 Document Reviewed: 11/16/2019 Elsevier Patient Education  2023 [employer].

## 2021-09-27 NOTE — Progress Notes (Signed)
Patient ID: Rachel [name], female   DOB: 04-Feb-1976, 46 y.o.   MRN: [id number]  HPI  Rachel [name] is a 46 y.o.-year-old female, referred by her PCP, Dr. Jonni Sanger, for evaluation for fatigue and orthostatic hypotension, with suspicion for adrenal insufficiency.  Patient describes an episode of significant fatigue, lightheadedness, dizziness and dehydration in 2020.  Blood pressure was found to be low.  At that time, she saw cardiology >> investigation was reportedly negative, but she was started on Florinef for hypotension. She felt better afterwards. Approx. 2 mo ago, she developed again the above sxs >> episodes of significant fatigue, paleness, dizziness sBP 80-90s/dBP 60s - when standing up. However, even when she sits down, BP is approx the same. Occasional palpitations.  No syncopal episodes.  No nausea or vomiting.  She has irregular menses. No unintentional weight loss.  She started [facility] in 04/2021 >> stopped 07/2021 >> lost 20 lbs (5 mg weekly dose).  She did gain some weight afterwards. 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 She had vestibular rehab in 07/2021.  She has darkening of her skin around her umbilicus, bilateral hands knuckles, and neck creases.  PCP checked a cortisol and ACTH level and these were normal. She saw cardiology and they added midodrine 2.5 mg 3 times a day - she just picked this up.  Cardiology recommended 64 ounces of water per day + electrolyte solutions. PCP also suggested dexamethasone 4 mg daily last mo.- she used this x1 week - she initially felt better, esp. In am >> energy started to decrease mid-day.  No h/o steroid use in the past except for Flonase - not in 2 years. No h/o Depo-provera, Megace, po ketoconazole, phenytoin, rifampin, chronic fluconazole use. No h/o autoimmune diseases in pt or family mbs. No excess use of NSAIDs. No h/o generalized infections or HIV. No IVDA. No h/o head injury or severe HA, but does have a history of migraines. No  h/o malignancy. No previous adrenal imaging.  I reviewed pt's cortisol levels: Component     Latest Ref Rng 08/18/2021 08/28/2021  Cortisol, Plasma     ug/dL - 8 am 7.7    C206 ACTH - 8 am     6 - 50 pg/mL  18   No h/o hyponatremia or hyperkalemia.   Chemistry      Component Value Date/Time   NA 137 07/29/2020 1144   NA 139 07/15/2018 1635   K 4.0 07/29/2020 1144   CL 102 07/29/2020 1144   CO2 27 07/29/2020 1144   BUN 7 07/29/2020 1144   BUN 8 07/15/2018 1635   CREATININE 0.47 07/29/2020 1144   CREATININE 0.45 (L) 07/20/2015 1048      Component Value Date/Time   CALCIUM 9.4 07/29/2020 1144   ALKPHOS 56 12/21/2020 0756   AST 12 12/21/2020 0756   ALT 8 12/21/2020 0756   BILITOT 0.8 12/21/2020 0756   BILITOT 0.8 07/15/2018 1635     She has a history of diabetes, for which she was on Mounjaro but she stopped due to the above symptoms.  She is also off metformin.  No hypoglycemia.  Last HbA1c was at goal: Lab Results  Component Value Date   HGBA1C 5.2 08/23/2021  She has a history of familial hyperlipidemia for which she is on Repatha, Zetia, and Crestor.  She has slight joint pains with statin She has a history of iron deficiency anemia, for which she is on iron therapy.  She has a history of menorrhagia and  was found to be premenopausal when an [facility] 05/27/2019 last year. She also has a history of peptic ulcer disease-on Protonix. She has a history of anxiety, but not depression.  ROS: + see HPI + Constipation + Tinnitus  Past Medical History:  Diagnosis Date   Anxiety    Familial hyperlipidemia 08/17/2015   Migraine without aura, not intractable, without status migrainosus 12/07/2020   Migraines    Orthostatic hypotension 08/17/2015   Prediabetes    PUD (peptic ulcer disease)    Past Surgical History:  Procedure Laterality Date   CESAREAN SECTION     Social History   Socioeconomic History   Marital status: Married    Spouse name: Not on file   Number of  children: 2   Years of education: Not on file   Highest education level: Not on file  Occupational History   Occupation: Buyer, retail    Employer: [employer]  Tobacco Use   Smoking status: Never   Smokeless tobacco: Never  Vaping Use   Vaping Use: Never used  Substance and Sexual Activity   Alcohol use: No   Drug use: No   Sexual activity: Yes    Birth control/protection: Condom  Other Topics Concern   Not on file  Social History Narrative   Lives with her husband and their 2 children   Her son is wheelchair bound due to spina bifida   Social Determinants of Radio broadcast assistant Strain: Not on file  Food Insecurity: Not on file  Transportation Needs: Not on file  Physical Activity: Not on file  Stress: Not on file  Social Connections: Not on file  Intimate Partner Violence: Not on file   Current Outpatient Medications on File Prior to Visit  Medication Sig Dispense Refill   dexamethasone (DECADRON) 4 MG tablet Take 1 tablet (4 mg total) by mouth daily. (Patient not taking: Reported on 09/26/2021) 30 tablet 2   Evolocumab (REPATHA SURECLICK) 419 MG/ML SOAJ Inject 1 pen into the skin every 14 (fourteen) days. 2 mL 11   ezetimibe (ZETIA) 10 MG tablet Take 1 tablet (10 mg total) by mouth daily. 90 tablet 3   fludrocortisone (FLORINEF) 0.1 MG tablet Take 1 tablet (0.1 mg total) by mouth daily. 90 tablet 3   fluticasone (FLONASE) 50 MCG/ACT nasal spray Place 1 spray into both nostrils 2 (two) times daily. 16 g 6   glucose blood (FREESTYLE LITE) test strip Use once daily to check blood sugar R73.03 100 each 4   glucose monitoring kit (FREESTYLE) monitoring kit 1 each by Does not apply route daily. 1 each 0   iron polysaccharides (NIFEREX) 150 MG capsule Take 1 capsule (150 mg total) by mouth daily. 60 capsule 2   Lancets MISC Use to check blood sugars daily. Please provide brand covered by insurance Dx code: R73.03 100 each 3   meclizine (ANTIVERT) 25 MG tablet Take  1 tablet (25 mg total) by mouth 3 (three) times daily as needed for dizziness. 30 tablet 0   midodrine (PROAMATINE) 2.5 MG tablet Take 1 tablet (2.5 mg total) by mouth 3 (three) times daily with meals. 270 tablet 3   Multiple Vitamin (MULTIVITAMIN WITH MINERALS) TABS tablet Take 1 tablet by mouth daily.     pantoprazole (PROTONIX) 20 MG tablet Take 1 tablet (20 mg total) by mouth 2 (two) times daily. 60 tablet 2   rizatriptan (MAXALT-MLT) 10 MG disintegrating tablet Take 1 tablet (10 mg total) by mouth as needed for  migraine. May repeat in 2 hours if needed 10 tablet 2   rosuvastatin (CRESTOR) 5 MG tablet Take 1 tablet (5 mg total) by mouth daily. 90 tablet 3   No current facility-administered medications on file prior to visit.   No Known Allergies Family History  Problem Relation Age of Onset   Spina bifida Son    Hyperlipidemia Daughter    Hyperlipidemia Mother    Heart disease Sister    Hyperlipidemia Sister    Hyperlipidemia Other    Breast cancer Paternal Aunt    Cancer Neg Hx     PE: BP 110/72 (BP Location: Right Arm, Patient Position: Sitting, Cuff Size: Normal)   Pulse 78   Ht $R'5\' 6"'MF$  (1.676 m)   Wt 172 lb 12.8 oz (78.4 kg)   SpO2 98%   BMI 27.89 kg/m  Wt Readings from Last 3 Encounters:  09/27/21 172 lb 12.8 oz (78.4 kg)  09/26/21 170 lb 12.8 oz (77.5 kg)  08/23/21 167 lb 6.4 oz (75.9 kg)   Constitutional: Slightly overweight, in NAD Eyes: PERRLA, EOMI, no exophthalmos ENT: moist mucous membranes, no thyromegaly, no cervical lymphadenopathy Cardiovascular: RRR, No MRG Respiratory: CTA B Musculoskeletal: no deformities, strength intact in all 4 Skin: moist, warm, no rashes;+ dark discoloration of skin around her umbilicus, also bilateral hands knuckles and neck creases Neurological: no tremor with outstretched hands, DTR normal in all 4  ASSESSMENT: 1.  Fatigue  2. Orthostatic hypotension  PLAN:  And 2. We discussed about possible causes for fatigue, for  e.g.: - diet - she is not on a restrictive diet; no strict vegan/keto/high fat diet - sedentarism - she is active throughout the day - insomnia - she sleeps 2-3h a night as she has to take care of her son who has spina bifida - depression - no - menopause/perimenopause -she has irregular menstrual cycles, FSH was in the perimenopausal range: Component     Latest Ref Rng 11/25/2020  [place]     mIU/ML 21.1   - polypharmacy  - certain medications -not on a beta-blocker or medications known to cause fatigue - vitamin D def -no recent vitamin D levels available for review: No results found for: "VD25OH" - vitamin B12 def - last B12 level normal: Lab Results  Component Value Date   VITAMINB12 614 08/09/2021  - anemia - last Hb normal, on iron, previously with mild anemia: Lab Results  Component Value Date   WBC 4.9 08/09/2021   HGB 12.4 08/09/2021   HCT 37.5 08/09/2021   MCV 79.6 08/09/2021   PLT 379.0 08/09/2021  - hypothyroidism - last TSH normal: Lab Results  Component Value Date   TSH 1.43 08/09/2021  - adrenal insufficiency - she does have orthostatic hypotension; she also has headaches, which are chronic (migraine); also has skin discoloration at site of creases; but no unintentional weight loss (weight loss stopped after stopping Mounjaro), no nausea or vomiting; no exogenous steroid use other than distant use of Flonase (not in the last 2 years).  No hyponatremia and hyperkalemia.  Latest cortisol and ACTH levels were normal, but patient was on fludrocortisone when these were checked and I wonder whether this could have suppress her ACTH: Component     Latest Ref Rng 08/18/2021 08/28/2021  Cortisol, Plasma     ug/dL 7.7    C206 ACTH     6 - 50 pg/mL  18   -At this point, I cannot rule out adrenal insufficiency.  Her fatigue may be  related to her lack of sleep, but I would not expect her orthostatic hypotension to be related to this - we discussed that the diagnosis test for AI is a  cosyntropin stimulation test. I explained what this consists of. We will plan to check this at 8 am >> will return for this. I advised her not to use any steroid products around the time of the test. - if pt turns out to have AI, we will need to check several tests to try to establish the etiology. - however, patient is leaving the country tomorrow for 2 weeks and she is in a hurry today so we cannot check her cosyntropin stimulation test.  Therefore, we discussed about starting treatment for adrenal insufficiency and checking her cosyntropin stimulation test when she returns. - we also discussed about proper replacement with Hydrocortisone in case she has AI  - a bid dose is likely best >> will start 10 mg in am and 5 mg in pm, best ~1-2 pm - it is interesting that she tried dexamethasone and this made her feel better in the morning but still felt extremely fatigued later in the afternoon, which is unusual, since dexamethasone is a long-acting steroid... - Discussed and also given written instructions about sick days rules: If you cannot keep anything down, including your hydrocortisone medication, please go to the emergency room or your primary care physician office to get steroids injected in the muscle or vein. Alternatively, you can inject 100 mg hydrocortisone in the muscle at home. If you have a fever (more than 100 Fahrenheit) or gastroenteritis with nausea/vomiting and diarrhea, please double the dose of your hydrocortisone for the duration of the fever or the gastroenteritis. Do not run out of your hydrocortisone medication. -We discussed that if she cannot keep the hydrocortisone tablets down, she might need to go to the UC or ED if cannot give injection -advised for wallet card mentioning "adrenal insufficiency" in case the tests are positive for this -Of note, she was already started on Florinef, which we will continue, but she did not start midodrine yet.  I advised her to start  hydrocortisone first and then see if we need midodrine. She does have a blood pressure cuff at home and she can do BP checks at home. - when she returns for the cosyntropin stimulation test, I advised her to hold her afternoon dose of hydrocortisone the day before and to hold the a.m. dose before the test.  She can take the a.m. dose right after the test.  She will come fasting, at 8 AM. - I will see her back in 2 months, but will be in touch in the interim about results and possibly further investigation needed  Orders Placed This Encounter  Procedures   Cortisol   ACTH   Cortisol   Cortisol   - Total time spent for the visit: 1 hour, in precharting, obtaining medical information from the patient, her PCP, and the chart, reviewing her  previous labs, imaging evaluations, and treatments, reviewing her symptoms, counseling her about her condition (please see the discussed topics above), and developing a plan to further investigate and treat it; she had a number of questions which I addressed.  Philemon Kingdom, MD PhD [facility]

## 2021-09-28 ENCOUNTER — Other Ambulatory Visit (HOSPITAL_BASED_OUTPATIENT_CLINIC_OR_DEPARTMENT_OTHER): Payer: Self-pay

## 2021-09-28 ENCOUNTER — Encounter: Payer: Self-pay | Admitting: Family Medicine

## 2021-09-29 ENCOUNTER — Ambulatory Visit: Payer: No Typology Code available for payment source | Admitting: Internal Medicine

## 2021-10-10 ENCOUNTER — Other Ambulatory Visit (HOSPITAL_BASED_OUTPATIENT_CLINIC_OR_DEPARTMENT_OTHER): Payer: Self-pay

## 2021-10-16 ENCOUNTER — Ambulatory Visit (HOSPITAL_BASED_OUTPATIENT_CLINIC_OR_DEPARTMENT_OTHER)
Admission: RE | Admit: 2021-10-16 | Discharge: 2021-10-16 | Disposition: A | Discharge status: Home / Self Care | Payer: No Typology Code available for payment source | Source: Ambulatory Visit | Attending: Family Medicine | Admitting: Family Medicine

## 2021-10-16 ENCOUNTER — Telehealth: Payer: Self-pay | Admitting: *Deleted

## 2021-10-16 ENCOUNTER — Encounter: Payer: Self-pay | Admitting: Family Medicine

## 2021-10-16 ENCOUNTER — Ambulatory Visit (INDEPENDENT_AMBULATORY_CARE_PROVIDER_SITE_OTHER): Payer: No Typology Code available for payment source | Admitting: Family Medicine

## 2021-10-16 VITALS — BP 118/84 | HR 88 | Temp 98.0°F | Ht 66.0 in | Wt 168.4 lb

## 2021-10-16 DIAGNOSIS — R1011 Right upper quadrant pain: Secondary | ICD-10-CM | POA: Diagnosis not present

## 2021-10-16 LAB — COMPREHENSIVE METABOLIC PANEL
ALT: 105 U/L — ABNORMAL HIGH (ref 0–35)
AST: 26 U/L (ref 0–37)
Albumin: 4.6 g/dL (ref 3.5–5.2)
Alkaline Phosphatase: 102 U/L (ref 39–117)
BUN: 7 mg/dL (ref 6–23)
CO2: 28 mEq/L (ref 19–32)
Calcium: 9.4 mg/dL (ref 8.4–10.5)
Chloride: 100 mEq/L (ref 96–112)
Creatinine, Ser: 0.47 mg/dL (ref 0.40–1.20)
GFR: 115.15 mL/min (ref >60.00)
Glucose, Bld: 90 mg/dL (ref 70–99)
Potassium: 4 mEq/L (ref 3.5–5.1)
Sodium: 139 mEq/L (ref 135–145)
Total Bilirubin: 0.8 mg/dL (ref 0.2–1.2)
Total Protein: 8 g/dL (ref 6.0–8.3)

## 2021-10-16 LAB — POCT URINALYSIS DIPSTICK
Bilirubin, UA: NEGATIVE
Blood, UA: NEGATIVE
Glucose, UA: NEGATIVE
Ketones, UA: NEGATIVE
Nitrite, UA: NEGATIVE
Protein, UA: NEGATIVE
Spec Grav, UA: <=1.005 — AB (ref 1.010–1.025)
Urobilinogen, UA: 0.2 E.U./dL
pH, UA: 7.5 (ref 5.0–8.0)

## 2021-10-16 LAB — LIPASE: Lipase: 17 U/L (ref 11.0–59.0)

## 2021-10-16 LAB — CBC WITH DIFFERENTIAL/PLATELET
Basophils Absolute: 0 10*3/uL (ref 0.0–0.1)
Basophils Relative: 0.5 % (ref 0.0–3.0)
Eosinophils Absolute: 0.1 10*3/uL (ref 0.0–0.7)
Eosinophils Relative: 1.9 % (ref 0.0–5.0)
HCT: 37.5 % (ref 36.0–46.0)
Hemoglobin: 12.5 g/dL (ref 12.0–15.0)
Lymphocytes Relative: 41.4 % (ref 12.0–46.0)
Lymphs Abs: 2.5 10*3/uL (ref 0.7–4.0)
MCHC: 33.4 g/dL (ref 30.0–36.0)
MCV: 78.8 fl (ref 78.0–100.0)
Monocytes Absolute: 0.7 10*3/uL (ref 0.1–1.0)
Monocytes Relative: 11.4 % (ref 3.0–12.0)
Neutro Abs: 2.7 10*3/uL (ref 1.4–7.7)
Neutrophils Relative %: 44.8 % (ref 43.0–77.0)
Platelets: 394 10*3/uL (ref 150.0–400.0)
RBC: 4.76 Mil/uL (ref 3.87–5.11)
RDW: 14.5 % (ref 11.5–15.5)
WBC: 6.1 10*3/uL (ref 4.0–10.5)

## 2021-10-16 LAB — AMYLASE: Amylase: 32 U/L (ref 27–131)

## 2021-10-16 LAB — POCT URINE PREGNANCY: Preg Test, Ur: NEGATIVE

## 2021-10-16 NOTE — Telephone Encounter (Signed)
Kristine from [facility] called to give report on patient:   Cholelithiasis. No additional sonographic evidence for acute  cholecystitis.

## 2021-10-16 NOTE — Progress Notes (Signed)
Subjective:     Patient ID: Rachel [name], female    DOB: 09/04/1975, 46 y.o.   MRN: [id number]  Chief Complaint  Patient presents with   Follow-up    Follow-up on gallbladder, gallstones      HPI Went to [place]-3 days in, v/d.  Took zofran.  Then abd pain mid epi to back-saw doc-antacid and pain meds.  Last wk. Severe pain-diffuse and rad to back and between shoulders-went to ER there.  Got IVF and pain meds and u/s and told stones.  Offered surgery there or do here so pt came here. Last attack 7/27.   Eating bland. Chlorhydrate de drotaverine 80mg , neofortan 160mg (ploroglucinol) Nexium-making worse Zantac-not really helping  Ate 4 hrs ago-cup of tea toast,lo fat cream cheese.  No f/c.  Still w/diarrhea/cramps-3-4x/day. Floating and some form now.   LMP May-irreg.  condoms  Health Maintenance Due  Topic Date Due   URINE MICROALBUMIN  08/10/2021    Past Medical History:  Diagnosis Date   Anxiety    Familial hyperlipidemia 08/17/2015   Migraine without aura, not intractable, without status migrainosus 12/07/2020   Migraines    Orthostatic hypotension 08/17/2015   Prediabetes    PUD (peptic ulcer disease)     Past Surgical History:  Procedure Laterality Date   CESAREAN SECTION      Outpatient Medications Prior to Visit  Medication Sig Dispense Refill   Evolocumab (REPATHA SURECLICK) 140 MG/ML SOAJ Inject 1 pen into the skin every 14 (fourteen) days. 2 mL 11   ezetimibe (ZETIA) 10 MG tablet Take 1 tablet (10 mg total) by mouth daily. 90 tablet 3   fludrocortisone (FLORINEF) 0.1 MG tablet Take 1 tablet (0.1 mg total) by mouth daily. 90 tablet 3   fluticasone (FLONASE) 50 MCG/ACT nasal spray Place 1 spray into both nostrils 2 (two) times daily. 16 g 6   glucose blood (FREESTYLE LITE) test strip Use once daily to check blood sugar R73.03 100 each 4   glucose monitoring kit (FREESTYLE) monitoring kit 1 each by Does not apply route daily. 1 each 0   hydrocortisone  (CORTEF) 5 MG tablet Take 2 tablets in am and 1 in pm, by mouth. 270 tablet 1   iron polysaccharides (NIFEREX) 150 MG capsule Take 1 capsule (150 mg total) by mouth daily. 60 capsule 2   Lancets MISC Use to check blood sugars daily. Please provide brand covered by insurance Dx code: R73.03 100 each 3   meclizine (ANTIVERT) 25 MG tablet Take 1 tablet (25 mg total) by mouth 3 (three) times daily as needed for dizziness. 30 tablet 0   midodrine (PROAMATINE) 2.5 MG tablet Take 1 tablet (2.5 mg total) by mouth 3 (three) times daily with meals. 270 tablet 3   Multiple Vitamin (MULTIVITAMIN WITH MINERALS) TABS tablet Take 1 tablet by mouth daily.     pantoprazole (PROTONIX) 20 MG tablet Take 1 tablet (20 mg total) by mouth 2 (two) times daily. 60 tablet 2   rizatriptan (MAXALT-MLT) 10 MG disintegrating tablet Take 1 tablet (10 mg total) by mouth as needed for migraine. May repeat in 2 hours if needed 10 tablet 2   rosuvastatin (CRESTOR) 5 MG tablet Take 1 tablet (5 mg total) by mouth daily. 90 tablet 3   No facility-administered medications prior to visit.    No Known Allergies ROS neg/noncontributory except as noted HPI/below No dysuria     Objective:     BP 118/84   Pulse 88  Temp 98 F (36.7 C) (Temporal)   Ht $R'5\' 6"'Jd$  (1.676 m)   Wt 168 lb 6 oz (76.4 kg)   SpO2 98%   BMI 27.18 kg/m  Wt Readings from Last 3 Encounters:  10/16/21 168 lb 6 oz (76.4 kg)  09/27/21 172 lb 12.8 oz (78.4 kg)  09/26/21 170 lb 12.8 oz (77.5 kg)    Physical Exam   Gen: WDWN NAD HEENT: NCAT, conjunctiva not injected, sclera nonicteric NECK:  supple, no thyromegaly, no nodes, no carotid bruits CARDIAC: RRR, S1S2+, no murmur.  LUNGS: CTAB. No wheezes ABDOMEN:  BS+, soft, mod tender RUQ, No HSM, no masses MSK: no gross abnormalities. No TTP upper back NEURO: A&O x3.  CN II-XII intact.  PSYCH: normal mood. Good eye contact  Results for orders placed or performed in visit on 10/16/21  POCT urinalysis  dipstick  Result Value Ref Range   Color, UA YELLOW    Clarity, UA CLEAR    Glucose, UA Negative Negative   Bilirubin, UA NEG    Ketones, UA NEG    Spec Grav, UA <=1.005 (A) 1.010 - 1.025   Blood, UA NEG    pH, UA 7.5 5.0 - 8.0   Protein, UA Negative Negative   Urobilinogen, UA 0.2 0.2 or 1.0 E.U./dL   Nitrite, UA NEG    Leukocytes, UA Small (1+) (A) Negative   Appearance     Odor    POCT urine pregnancy  Result Value Ref Range   Preg Test, Ur Negative Negative        Assessment & Plan:   Problem List Items Addressed This Visit   None Visit Diagnoses     RUQ pain    -  Primary   Relevant Orders   Comprehensive metabolic panel   CBC with Differential/Platelet   Amylase   Lipase   US ABDOMEN LIMITED RUQ (LIVER/GB)   POCT urinalysis dipstick   POCT urine pregnancy   Ambulatory referral to General Surgery      RUQ pain-in [place]-u/s + for gallstones-no records.  Will check cbc,cmp,amylase/lipase, ua.  Cont meds from ER.  Check stat u/s and urgent referral to Surg.  Worse, etc, ER.  Complicated and not stable.  Has DM.  Looked up meds from ER-antispamotics.   UA small leukocytes-no symptoms.  Check cx. May be from diarrhea  No orders of the defined types were placed in this encounter.   Wellington [place], MD

## 2021-10-17 LAB — URINE CULTURE
MICRO NUMBER:: 13714977
SPECIMEN QUALITY:: ADEQUATE

## 2021-10-18 ENCOUNTER — Other Ambulatory Visit: Payer: Self-pay

## 2021-10-18 ENCOUNTER — Encounter (HOSPITAL_COMMUNITY): Payer: Self-pay

## 2021-10-18 ENCOUNTER — Emergency Department (HOSPITAL_COMMUNITY): Payer: No Typology Code available for payment source | Admitting: Anesthesiology

## 2021-10-18 ENCOUNTER — Ambulatory Visit (HOSPITAL_BASED_OUTPATIENT_CLINIC_OR_DEPARTMENT_OTHER)
Admission: EM | Admit: 2021-10-18 | Discharge: 2021-10-18 | Disposition: A | Discharge status: Home / Self Care | Payer: No Typology Code available for payment source | Attending: Emergency Medicine | Admitting: Emergency Medicine

## 2021-10-18 ENCOUNTER — Encounter (HOSPITAL_BASED_OUTPATIENT_CLINIC_OR_DEPARTMENT_OTHER): Payer: Self-pay

## 2021-10-18 ENCOUNTER — Encounter (HOSPITAL_COMMUNITY)
Admission: EM | Disposition: A | Discharge status: Home / Self Care | Payer: Self-pay | Source: Home / Self Care | Attending: Emergency Medicine

## 2021-10-18 ENCOUNTER — Emergency Department (EMERGENCY_DEPARTMENT_HOSPITAL): Payer: No Typology Code available for payment source | Admitting: Anesthesiology

## 2021-10-18 ENCOUNTER — Other Ambulatory Visit (INDEPENDENT_AMBULATORY_CARE_PROVIDER_SITE_OTHER): Payer: No Typology Code available for payment source

## 2021-10-18 ENCOUNTER — Emergency Department (HOSPITAL_BASED_OUTPATIENT_CLINIC_OR_DEPARTMENT_OTHER): Payer: No Typology Code available for payment source

## 2021-10-18 DIAGNOSIS — R5382 Chronic fatigue, unspecified: Secondary | ICD-10-CM

## 2021-10-18 DIAGNOSIS — F419 Anxiety disorder, unspecified: Secondary | ICD-10-CM | POA: Diagnosis not present

## 2021-10-18 DIAGNOSIS — K810 Acute cholecystitis: Secondary | ICD-10-CM | POA: Diagnosis not present

## 2021-10-18 DIAGNOSIS — Z79899 Other long term (current) drug therapy: Secondary | ICD-10-CM | POA: Insufficient documentation

## 2021-10-18 DIAGNOSIS — K8010 Calculus of gallbladder with chronic cholecystitis without obstruction: Secondary | ICD-10-CM | POA: Diagnosis not present

## 2021-10-18 DIAGNOSIS — I951 Orthostatic hypotension: Secondary | ICD-10-CM | POA: Diagnosis not present

## 2021-10-18 DIAGNOSIS — K219 Gastro-esophageal reflux disease without esophagitis: Secondary | ICD-10-CM | POA: Insufficient documentation

## 2021-10-18 DIAGNOSIS — Z8711 Personal history of peptic ulcer disease: Secondary | ICD-10-CM | POA: Diagnosis not present

## 2021-10-18 DIAGNOSIS — R7303 Prediabetes: Secondary | ICD-10-CM | POA: Diagnosis not present

## 2021-10-18 DIAGNOSIS — K8020 Calculus of gallbladder without cholecystitis without obstruction: Secondary | ICD-10-CM | POA: Diagnosis present

## 2021-10-18 HISTORY — PX: CHOLECYSTECTOMY: SHX55

## 2021-10-18 HISTORY — DX: Type 2 diabetes mellitus without complications: E11.9

## 2021-10-18 LAB — CBC
HCT: 40 % (ref 36.0–46.0)
Hemoglobin: 13.2 g/dL (ref 12.0–15.0)
MCH: 26.2 pg (ref 26.0–34.0)
MCHC: 33 g/dL (ref 30.0–36.0)
MCV: 79.5 fL — ABNORMAL LOW (ref 80.0–100.0)
Platelets: 338 10*3/uL (ref 150–400)
RBC: 5.03 MIL/uL (ref 3.87–5.11)
RDW: 13.8 % (ref 11.5–15.5)
WBC: 7.8 10*3/uL (ref 4.0–10.5)
nRBC: 0 % (ref 0.0–0.2)

## 2021-10-18 LAB — COMPREHENSIVE METABOLIC PANEL
ALT: 62 U/L — ABNORMAL HIGH (ref 0–44)
AST: 23 U/L (ref 15–41)
Albumin: 4.7 g/dL (ref 3.5–5.0)
Alkaline Phosphatase: 91 U/L (ref 38–126)
Anion gap: 14 (ref 5–15)
BUN: 9 mg/dL (ref 6–20)
CO2: 27 mmol/L (ref 22–32)
Calcium: 9.8 mg/dL (ref 8.9–10.3)
Chloride: 97 mmol/L — ABNORMAL LOW (ref 98–111)
Creatinine, Ser: 0.56 mg/dL (ref 0.44–1.00)
GFR, Estimated: >60 mL/min (ref >60)
Glucose, Bld: 232 mg/dL — ABNORMAL HIGH (ref 70–99)
Potassium: 3.9 mmol/L (ref 3.5–5.1)
Sodium: 138 mmol/L (ref 135–145)
Total Bilirubin: 0.9 mg/dL (ref 0.3–1.2)
Total Protein: 7.9 g/dL (ref 6.5–8.1)

## 2021-10-18 LAB — CORTISOL
Cortisol, Plasma: 8 ug/dL
Cortisol, Plasma: 22 ug/dL
Cortisol, Plasma: 27.5 ug/dL

## 2021-10-18 LAB — GLUCOSE, CAPILLARY
Glucose-Capillary: 101 mg/dL — ABNORMAL HIGH (ref 70–99)
Glucose-Capillary: 130 mg/dL — ABNORMAL HIGH (ref 70–99)

## 2021-10-18 LAB — HCG, SERUM, QUALITATIVE: Preg, Serum: NEGATIVE

## 2021-10-18 LAB — LIPASE, BLOOD: Lipase: 21 U/L (ref 11–51)

## 2021-10-18 SURGERY — LAPAROSCOPIC CHOLECYSTECTOMY
Anesthesia: General | Site: Abdomen

## 2021-10-18 MED ORDER — ROCURONIUM BROMIDE 10 MG/ML (PF) SYRINGE
PREFILLED_SYRINGE | INTRAVENOUS | Status: DC | PRN
  Administered 2021-10-18: 50 mg via INTRAVENOUS

## 2021-10-18 MED ORDER — SUCCINYLCHOLINE CHLORIDE 200 MG/10ML IV SOSY
PREFILLED_SYRINGE | INTRAVENOUS | Status: DC | PRN
  Administered 2021-10-18: 80 mg via INTRAVENOUS

## 2021-10-18 MED ORDER — PROPOFOL 10 MG/ML IV BOLUS
INTRAVENOUS | Status: AC
  Filled 2021-10-18: qty 20

## 2021-10-18 MED ORDER — FENTANYL CITRATE (PF) 250 MCG/5ML IJ SOLN
INTRAMUSCULAR | Status: AC
  Filled 2021-10-18: qty 5

## 2021-10-18 MED ORDER — COSYNTROPIN 0.25 MG IJ SOLR
0.2500 mg | Freq: Once | INTRAMUSCULAR | Status: DC
  Administered 2021-10-18: 0.25 mg via INTRAVENOUS

## 2021-10-18 MED ORDER — BUPIVACAINE-EPINEPHRINE 0.25% -1:200000 IJ SOLN
INTRAMUSCULAR | Status: DC | PRN
  Administered 2021-10-18: 20 mL

## 2021-10-18 MED ORDER — ACETAMINOPHEN 500 MG PO TABS
1000.0000 mg | ORAL_TABLET | Freq: Once | ORAL | Status: AC
  Administered 2021-10-18: 1000 mg via ORAL
  Filled 2021-10-18: qty 2

## 2021-10-18 MED ORDER — SCOPOLAMINE 1 MG/3DAYS TD PT72
1.0000 | MEDICATED_PATCH | TRANSDERMAL | Status: DC
  Administered 2021-10-18: 1.5 mg via TRANSDERMAL
  Filled 2021-10-18: qty 1

## 2021-10-18 MED ORDER — OXYCODONE HCL 5 MG PO TABS: 5.0000 mg | ORAL_TABLET | Freq: Four times a day (QID) | ORAL | 0 refills | Status: DC | PRN

## 2021-10-18 MED ORDER — ONDANSETRON HCL 4 MG/2ML IJ SOLN: 4.0000 mg | Freq: Once | INTRAMUSCULAR | Status: DC | PRN

## 2021-10-18 MED ORDER — DEXAMETHASONE SODIUM PHOSPHATE 10 MG/ML IJ SOLN
INTRAMUSCULAR | Status: DC | PRN
  Administered 2021-10-18: 5 mg via INTRAVENOUS

## 2021-10-18 MED ORDER — PROPOFOL 10 MG/ML IV BOLUS
INTRAVENOUS | Status: DC | PRN
  Administered 2021-10-18: 140 mg via INTRAVENOUS

## 2021-10-18 MED ORDER — SODIUM CHLORIDE 0.9 % IV SOLN: 2.0000 g | INTRAVENOUS | Status: DC

## 2021-10-18 MED ORDER — FENTANYL CITRATE PF 50 MCG/ML IJ SOSY
50.0000 ug | PREFILLED_SYRINGE | Freq: Once | INTRAMUSCULAR | Status: AC
  Administered 2021-10-18: 50 ug via INTRAVENOUS
  Filled 2021-10-18: qty 1

## 2021-10-18 MED ORDER — INSULIN ASPART 100 UNIT/ML IJ SOLN: 0.0000 [IU] | INTRAMUSCULAR | Status: DC | PRN

## 2021-10-18 MED ORDER — 0.9 % SODIUM CHLORIDE (POUR BTL) OPTIME
TOPICAL | Status: DC | PRN
  Administered 2021-10-18: 1000 mL

## 2021-10-18 MED ORDER — FENTANYL CITRATE PF 50 MCG/ML IJ SOSY
50.0000 ug | PREFILLED_SYRINGE | INTRAMUSCULAR | Status: DC | PRN
  Administered 2021-10-18: 50 ug via INTRAVENOUS
  Filled 2021-10-18: qty 1

## 2021-10-18 MED ORDER — MIDAZOLAM HCL 2 MG/2ML IJ SOLN
INTRAMUSCULAR | Status: AC
  Filled 2021-10-18: qty 2

## 2021-10-18 MED ORDER — KETOROLAC TROMETHAMINE 30 MG/ML IJ SOLN
INTRAMUSCULAR | Status: DC | PRN
  Administered 2021-10-18: 30 mg via INTRAVENOUS

## 2021-10-18 MED ORDER — BUPIVACAINE-EPINEPHRINE (PF) 0.25% -1:200000 IJ SOLN
INTRAMUSCULAR | Status: AC
  Filled 2021-10-18: qty 30

## 2021-10-18 MED ORDER — SUCCINYLCHOLINE CHLORIDE 200 MG/10ML IV SOSY
PREFILLED_SYRINGE | INTRAVENOUS | Status: AC
  Filled 2021-10-18: qty 10

## 2021-10-18 MED ORDER — LIDOCAINE 2% (20 MG/ML) 5 ML SYRINGE
INTRAMUSCULAR | Status: AC
  Filled 2021-10-18: qty 5

## 2021-10-18 MED ORDER — ONDANSETRON HCL 4 MG/2ML IJ SOLN
INTRAMUSCULAR | Status: DC | PRN
  Administered 2021-10-18: 4 mg via INTRAVENOUS

## 2021-10-18 MED ORDER — HYDROMORPHONE HCL 1 MG/ML IJ SOLN
INTRAMUSCULAR | Status: AC
  Filled 2021-10-18: qty 0.5

## 2021-10-18 MED ORDER — HYDROMORPHONE HCL 1 MG/ML IJ SOLN
INTRAMUSCULAR | Status: DC | PRN
  Administered 2021-10-18: 1 mg via INTRAVENOUS

## 2021-10-18 MED ORDER — KETOROLAC TROMETHAMINE 30 MG/ML IJ SOLN
INTRAMUSCULAR | Status: AC
  Filled 2021-10-18: qty 1

## 2021-10-18 MED ORDER — OXYCODONE HCL 5 MG/5ML PO SOLN: 5.0000 mg | Freq: Once | ORAL | Status: DC | PRN

## 2021-10-18 MED ORDER — SODIUM CHLORIDE 0.9 % IV SOLN
INTRAVENOUS | Status: AC
  Filled 2021-10-18: qty 20

## 2021-10-18 MED ORDER — AMISULPRIDE (ANTIEMETIC) 5 MG/2ML IV SOLN: 10.0000 mg | Freq: Once | INTRAVENOUS | Status: DC | PRN

## 2021-10-18 MED ORDER — MIDAZOLAM HCL 2 MG/2ML IJ SOLN
INTRAMUSCULAR | Status: DC | PRN
  Administered 2021-10-18: 2 mg via INTRAVENOUS

## 2021-10-18 MED ORDER — FENTANYL CITRATE (PF) 250 MCG/5ML IJ SOLN
INTRAMUSCULAR | Status: DC | PRN
  Administered 2021-10-18 (×2): 100 ug via INTRAVENOUS
  Administered 2021-10-18: 50 ug via INTRAVENOUS

## 2021-10-18 MED ORDER — DEXAMETHASONE SODIUM PHOSPHATE 10 MG/ML IJ SOLN
INTRAMUSCULAR | Status: AC
  Filled 2021-10-18: qty 1

## 2021-10-18 MED ORDER — SODIUM CHLORIDE 0.9 % IR SOLN
Status: DC | PRN
  Administered 2021-10-18: 1000 mL

## 2021-10-18 MED ORDER — LACTATED RINGERS IV SOLN: INTRAVENOUS | Status: DC

## 2021-10-18 MED ORDER — MEPERIDINE HCL 25 MG/ML IJ SOLN: 6.2500 mg | INTRAMUSCULAR | Status: DC | PRN

## 2021-10-18 MED ORDER — SUGAMMADEX SODIUM 200 MG/2ML IV SOLN
INTRAVENOUS | Status: DC | PRN
  Administered 2021-10-18: 200 mg via INTRAVENOUS

## 2021-10-18 MED ORDER — KETOROLAC TROMETHAMINE 30 MG/ML IJ SOLN
30.0000 mg | Freq: Once | INTRAMUSCULAR | Status: AC | PRN
  Administered 2021-10-18: 30 mg via INTRAVENOUS

## 2021-10-18 MED ORDER — ONDANSETRON HCL 4 MG/2ML IJ SOLN
4.0000 mg | Freq: Once | INTRAMUSCULAR | Status: AC
  Administered 2021-10-18: 4 mg via INTRAVENOUS
  Filled 2021-10-18: qty 2

## 2021-10-18 MED ORDER — LIDOCAINE 2% (20 MG/ML) 5 ML SYRINGE
INTRAMUSCULAR | Status: DC | PRN
  Administered 2021-10-18: 100 mg via INTRAVENOUS

## 2021-10-18 MED ORDER — OXYCODONE HCL 5 MG PO TABS: 5.0000 mg | ORAL_TABLET | Freq: Once | ORAL | Status: DC | PRN

## 2021-10-18 MED ORDER — SODIUM CHLORIDE 0.9 % IV SOLN
2.0000 g | INTRAVENOUS | Status: AC
  Administered 2021-10-18: 2 g via INTRAVENOUS
  Filled 2021-10-18: qty 20

## 2021-10-18 MED ORDER — HYDROMORPHONE HCL 1 MG/ML IJ SOLN
INTRAMUSCULAR | Status: AC
  Filled 2021-10-18: qty 1

## 2021-10-18 MED ORDER — ORAL CARE MOUTH RINSE: 15.0000 mL | Freq: Once | OROMUCOSAL | Status: AC

## 2021-10-18 MED ORDER — ROCURONIUM BROMIDE 10 MG/ML (PF) SYRINGE
PREFILLED_SYRINGE | INTRAVENOUS | Status: AC
  Filled 2021-10-18: qty 10

## 2021-10-18 MED ORDER — CHLORHEXIDINE GLUCONATE 0.12 % MT SOLN
15.0000 mL | Freq: Once | OROMUCOSAL | Status: AC
  Administered 2021-10-18: 15 mL via OROMUCOSAL
  Filled 2021-10-18: qty 15

## 2021-10-18 MED ORDER — HYDROMORPHONE HCL 1 MG/ML IJ SOLN
0.2500 mg | INTRAMUSCULAR | Status: DC | PRN
  Administered 2021-10-18: 0.5 mg via INTRAVENOUS
  Administered 2021-10-18: 0.25 mg via INTRAVENOUS

## 2021-10-18 MED ORDER — ONDANSETRON HCL 4 MG/2ML IJ SOLN
INTRAMUSCULAR | Status: AC
  Filled 2021-10-18: qty 2

## 2021-10-18 SURGICAL SUPPLY — 38 items
APPLIER CLIP 5 13 M/L LIGAMAX5 (MISCELLANEOUS) ×2
BAG COUNTER SPONGE SURGICOUNT (BAG) ×2 IMPLANT
CANISTER SUCT 3000ML PPV (MISCELLANEOUS) ×2 IMPLANT
CHLORAPREP W/TINT 26 (MISCELLANEOUS) ×2 IMPLANT
CLIP APPLIE 5 13 M/L LIGAMAX5 (MISCELLANEOUS) ×1 IMPLANT
COVER SURGICAL LIGHT HANDLE (MISCELLANEOUS) ×2 IMPLANT
DERMABOND ADVANCED (GAUZE/BANDAGES/DRESSINGS) ×1
DERMABOND ADVANCED .7 DNX12 (GAUZE/BANDAGES/DRESSINGS) ×1 IMPLANT
ELECT REM PT RETURN 9FT ADLT (ELECTROSURGICAL) ×2
ELECTRODE REM PT RTRN 9FT ADLT (ELECTROSURGICAL) ×1 IMPLANT
GLOVE BIO SURGEON STRL SZ8 (GLOVE) ×2 IMPLANT
GLOVE BIOGEL PI IND STRL 8 (GLOVE) ×1 IMPLANT
GLOVE BIOGEL PI INDICATOR 8 (GLOVE) ×1
GOWN STRL REUS W/ TWL LRG LVL3 (GOWN DISPOSABLE) ×2 IMPLANT
GOWN STRL REUS W/ TWL XL LVL3 (GOWN DISPOSABLE) ×1 IMPLANT
GOWN STRL REUS W/TWL LRG LVL3 (GOWN DISPOSABLE) ×6
GOWN STRL REUS W/TWL XL LVL3 (GOWN DISPOSABLE) ×2
KIT BASIN OR (CUSTOM PROCEDURE TRAY) ×2 IMPLANT
KIT TURNOVER KIT B (KITS) ×2 IMPLANT
L-HOOK LAP DISP 36CM (ELECTROSURGICAL) ×2
LHOOK LAP DISP 36CM (ELECTROSURGICAL) ×1 IMPLANT
NEEDLE 22X1 1/2 (OR ONLY) (NEEDLE) ×2 IMPLANT
NS IRRIG 1000ML POUR BTL (IV SOLUTION) ×2 IMPLANT
PAD ARMBOARD 7.5X6 YLW CONV (MISCELLANEOUS) ×2 IMPLANT
PENCIL BUTTON HOLSTER BLD 10FT (ELECTRODE) ×2 IMPLANT
POUCH RETRIEVAL ECOSAC 10 (ENDOMECHANICALS) ×1 IMPLANT
POUCH RETRIEVAL ECOSAC 10MM (ENDOMECHANICALS) ×2
SCISSORS LAP 5X35 DISP (ENDOMECHANICALS) ×2 IMPLANT
SET IRRIG TUBING LAPAROSCOPIC (IRRIGATION / IRRIGATOR) ×2 IMPLANT
SET TUBE SMOKE EVAC HIGH FLOW (TUBING) ×2 IMPLANT
SLEEVE ENDOPATH XCEL 5M (ENDOMECHANICALS) ×4 IMPLANT
SPECIMEN JAR SMALL (MISCELLANEOUS) ×2 IMPLANT
SUT VIC AB 4-0 PS2 27 (SUTURE) ×2 IMPLANT
TOWEL GREEN STERILE (TOWEL DISPOSABLE) ×2 IMPLANT
TRAY LAPAROSCOPIC MC (CUSTOM PROCEDURE TRAY) ×2 IMPLANT
TROCAR XCEL BLUNT TIP 100MML (ENDOMECHANICALS) ×2 IMPLANT
TROCAR Z-THREAD OPTICAL 5X100M (TROCAR) ×2 IMPLANT
WARMER LAPAROSCOPE (MISCELLANEOUS) ×2 IMPLANT

## 2021-10-18 NOTE — Anesthesia Procedure Notes (Signed)
Procedure Name: Intubation Date/Time: 10/18/2021 3:54 PM  Performed by: Babs Bertin, CRNAPre-anesthesia Checklist: Patient identified, Emergency Drugs available, Suction available and Patient being monitored Patient Re-evaluated:Patient Re-evaluated prior to induction Oxygen Delivery Method: Circle system utilized Preoxygenation: Pre-oxygenation with 100% oxygen Induction Type: IV induction Ventilation: Mask ventilation without difficulty Laryngoscope Size: Mac and 3 Grade View: Grade III Tube type: Oral Tube size: 7.0 mm Number of attempts: 1 Airway Equipment and Method: Stylet and Oral airway Placement Confirmation: ETT inserted through vocal cords under direct vision, positive ETCO2 and breath sounds checked- equal and bilateral Secured at: 21 cm Tube secured with: Tape Dental Injury: Teeth and Oropharynx as per pre-operative assessment

## 2021-10-18 NOTE — Progress Notes (Signed)
Patient verbally confirmed name, date of birth, and correct medication to be administered. Cosyntropin injection administered and pt tolerated well.  

## 2021-10-18 NOTE — Transfer of Care (Signed)
Immediate Anesthesia Transfer of Care Note  Patient: Rachel [name]  Procedure(s) Performed: LAPAROSCOPIC CHOLECYSTECTOMY (Abdomen)  Patient Location: PACU  Anesthesia Type:General  Level of Consciousness: drowsy  Airway & Oxygen Therapy: Patient Spontanous Breathing and Patient connected to face mask oxygen  Post-op Assessment: Report given to RN and Post -op Vital signs reviewed and stable  Post vital signs: Reviewed and stable  Last Vitals:  Vitals Value Taken Time  BP    Temp    Pulse 68 10/18/21 1653  Resp 15 10/18/21 1653  SpO2 100 % 10/18/21 1653  Vitals shown include unvalidated device data.  Last Pain:  Vitals:   10/18/21 1509  PainSc: 9       Patients Stated Pain Goal: 3 (10/18/21 1509)  Complications: No notable events documented.

## 2021-10-18 NOTE — H&P (Signed)
Rachel [name] 07/25/1975  [id number].    Requesting MD: Laverta Baltimore, MD Chief Complaint/Reason for Consult: cholecystitis   HPI:  Rachel [name] is a 46 year old female with a past medical history of peptic ulcer disease, diabetes mellitus type 2, hyperlipidemia, and hypertension who presented with a chief complaint of right upper quadrant abdominal pain.  Pain described as sharp, nonradiating, constant.  Reports similar pain 10 days ago while she was traveling in [place] visiting family.  States that at that time she was diagnosed with gallstones.  Reports another attack 3 days ago, and then again this morning.  The pain occurred after she ate spicy food.  States her pain is becoming more severe with each attack.  Associated symptoms include nausea, vomiting, diarrhea, decreased appetite.   She tried to take Protonix without relief of symptoms.  She denies fever, chills, hematemesis, or blood in her stool.  She denies urinary symptoms. She denies tobacco, alcohol, or drug use.  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  Past abdominal surgeries include cesarean section.  ROS: As above Review of Systems  All other systems reviewed and are negative.   Family History  Problem Relation Age of Onset   Spina bifida Son    Hyperlipidemia Daughter    Hyperlipidemia Mother    Heart disease Sister    Hyperlipidemia Sister    Hyperlipidemia Other    Breast cancer Paternal Aunt    Cancer Neg Hx     Past Medical History:  Diagnosis Date   Anxiety    Familial hyperlipidemia 08/17/2015   Migraine without aura, not intractable, without status migrainosus 12/07/2020   Migraines    Orthostatic hypotension 08/17/2015   Prediabetes    PUD (peptic ulcer disease)     Past Surgical History:  Procedure Laterality Date   CESAREAN SECTION      Social History:  reports that she has never smoked. She has never used smokeless tobacco. She reports that she does  not drink alcohol and does not use drugs.  Allergies: No Known Allergies  Medications Prior to Admission  Medication Sig Dispense Refill   Evolocumab (REPATHA SURECLICK) 703 MG/ML SOAJ Inject 1 pen into the skin every 14 (fourteen) days. 2 mL 11   ezetimibe (ZETIA) 10 MG tablet Take 1 tablet (10 mg total) by mouth daily. 90 tablet 3   fludrocortisone (FLORINEF) 0.1 MG tablet Take 1 tablet (0.1 mg total) by mouth daily. 90 tablet 3   hydrocortisone (CORTEF) 5 MG tablet Take 2 tablets in am and 1 in pm, by mouth. 270 tablet 1   ibuprofen (ADVIL) 200 MG tablet Take 200 mg by mouth every 6 (six) hours as needed.     Multiple Vitamin (MULTIVITAMIN WITH MINERALS) TABS tablet Take 1 tablet by mouth daily.     pantoprazole (PROTONIX) 20 MG tablet Take 1 tablet (20 mg total) by mouth 2 (two) times daily. 60 tablet 2   rosuvastatin (CRESTOR) 5 MG tablet Take 1 tablet (5 mg total) by mouth daily. 90 tablet 3   fluticasone (FLONASE) 50 MCG/ACT nasal spray Place 1 spray into both nostrils 2 (two) times daily. 16 g 6   glucose blood (FREESTYLE LITE) test strip Use once daily to check blood sugar R73.03 100 each 4   glucose monitoring kit (FREESTYLE) monitoring kit 1 each by Does not apply route daily. 1 each 0   iron polysaccharides (NIFEREX) 150 MG capsule Take 1 capsule (  150 mg total) by mouth daily. (Patient not taking: Reported on 10/18/2021) 60 capsule 2   Lancets MISC Use to check blood sugars daily. Please provide brand covered by insurance Dx code: R73.03 100 each 3   meclizine (ANTIVERT) 25 MG tablet Take 1 tablet (25 mg total) by mouth 3 (three) times daily as needed for dizziness. 30 tablet 0   midodrine (PROAMATINE) 2.5 MG tablet Take 1 tablet (2.5 mg total) by mouth 3 (three) times daily with meals. 270 tablet 3   rizatriptan (MAXALT-MLT) 10 MG disintegrating tablet Take 1 tablet (10 mg total) by mouth as needed for migraine. May repeat in 2 hours if needed 10 tablet 2     Physical Exam: Blood  pressure 125/81, pulse 69, temperature 98.2 F (36.8 C), resp. rate 18, height $RemoveBe'5\' 6"'ajUeZPtTL$  (1.676 m), weight 76.2 kg, SpO2 99 %. General: Pleasant female laying on hospital bed, appears stated age, NAD. HEENT: head -normocephalic, atraumatic; Eyes: PERRLA, anicteric sclerae Neck- Trachea is midline, no thyromegaly or JVD appreciated.  CV- RRR, normal S1/S2, no M/R/G, no lower extremity edema. Pulm- breathing is non-labored on room air Abd- soft, tender to palpation in the right upper quadrant, no hernias, no organomegaly, no rebound tenderness or peritonitis GU- deferred  MSK- UE/LE symmetrical, no cyanosis, clubbing, or edema. Neuro-nonfocal exam, no paresthesias.  Gait not assessed Psych- Alert and Oriented x3 with appropriate affect Skin: warm and dry, no rashes or lesions    Results for orders placed or performed during the hospital encounter of 10/18/21 (from the past 48 hour(s))  Lipase, blood     Status: None   Collection Time: 10/18/21 10:55 AM  Result Value Ref Range   Lipase 21 11 - 51 U/L    Comment: Performed at [facility], [address], [place], [place] [address]  Comprehensive metabolic panel     Status: Abnormal   Collection Time: 10/18/21 10:55 AM  Result Value Ref Range   Sodium 138 135 - 145 mmol/L   Potassium 3.9 3.5 - 5.1 mmol/L   Chloride 97 (L) 98 - 111 mmol/L   CO2 27 22 - 32 mmol/L   Glucose, Bld 232 (H) 70 - 99 mg/dL    Comment: Glucose reference range applies only to samples taken after fasting for at least 8 hours.   BUN 9 6 - 20 mg/dL   Creatinine, Ser 0.56 0.44 - 1.00 mg/dL   Calcium 9.8 8.9 - 10.3 mg/dL   Total Protein 7.9 6.5 - 8.1 g/dL   Albumin 4.7 3.5 - 5.0 g/dL   AST 23 15 - 41 U/L   ALT 62 (H) 0 - 44 U/L   Alkaline Phosphatase 91 38 - 126 U/L   Total Bilirubin 0.9 0.3 - 1.2 mg/dL   GFR, Estimated >60 >60 mL/min    Comment: (NOTE) Calculated using the CKD-EPI Creatinine Equation (2021)    Anion gap 14 5 - 15    Comment:  Performed at [facility], [address], [place], [place] [address]  CBC     Status: Abnormal   Collection Time: 10/18/21 10:55 AM  Result Value Ref Range   WBC 7.8 4.0 - 10.5 K/uL   RBC 5.03 3.87 - 5.11 MIL/uL   Hemoglobin 13.2 12.0 - 15.0 g/dL   HCT 40.0 36.0 - 46.0 %   MCV 79.5 (L) 80.0 - 100.0 fL   MCH 26.2 26.0 - 34.0 pg   MCHC 33.0 30.0 - 36.0 g/dL   RDW 13.8  11.5 - 15.5 %   Platelets 338 150 - 400 K/uL   nRBC 0.0 0.0 - 0.2 %    Comment: Performed at [facility], [address], [place], [place] [address]  Pregnancy serum, qualitative     Status: None   Collection Time: 10/18/21 10:55 AM  Result Value Ref Range   Preg, Serum NEGATIVE NEGATIVE    Comment:        THE SENSITIVITY OF THIS METHODOLOGY IS >10 mIU/mL. Performed at [facility], [address] [place], [place] [address]   Glucose, capillary     Status: Abnormal   Collection Time: 10/18/21  2:46 PM  Result Value Ref Range   Glucose-Capillary 101 (H) 70 - 99 mg/dL    Comment: Glucose reference range applies only to samples taken after fasting for at least 8 hours.   US Abdomen Limited RUQ (LIVER/GB)  Result Date: 10/18/2021 CLINICAL DATA:  Right upper quadrant pain. EXAM: ULTRASOUND ABDOMEN LIMITED RIGHT UPPER QUADRANT COMPARISON:  10/16/2021 FINDINGS: Gallbladder: Gallbladder is nondistended as patient has not had an appropriate period of fasting prior to this exam. Multiple gallstones evident, as noted on the recent comparison study. Gallbladder wall thickness is 4-5 mm, nonspecific given the gallbladder nondistention. Sonographer reports no sonographic Murphy sign. Common bile duct: Diameter: 4 mm Liver: No focal lesion identified. Within normal limits in parenchymal echogenicity. Portal vein is patent on color Doppler imaging with normal direction of blood flow towards the liver. Other: None. IMPRESSION: 1. Cholelithiasis. Gallbladder wall thickening seen  on today's study may well be related to the under distention of the gallbladder in this patient without an appropriate period of fasting prior to the study. If there is clinical concern for cystic duct obstruction, nuclear scintigraphy may prove helpful to further evaluate. Electronically Signed   By: Misty Stanley M.D.   On: 10/18/2021 12:37   US ABDOMEN LIMITED RUQ (LIVER/GB)  Result Date: 10/16/2021 CLINICAL DATA:  Right upper quadrant pain. EXAM: ULTRASOUND ABDOMEN LIMITED RIGHT UPPER QUADRANT COMPARISON:  Abdominal ultrasound 01/01/2014 FINDINGS: Gallbladder: Multiple small gallstones are present measuring up to 3 mm. Sonographic Percell Miller sign is negative. There is no gallbladder wall thickening or pericholecystic fluid. Common bile duct: Diameter: 2.2 mm. Liver: No focal lesion identified. Within normal limits in parenchymal echogenicity. Portal vein is patent on color Doppler imaging with normal direction of blood flow towards the liver. Other: None. IMPRESSION: 1. Cholelithiasis. No additional sonographic evidence for acute cholecystitis. Electronically Signed   By: Ronney Asters M.D.   On: 10/16/2021 16:10      Assessment/Plan Symptomatic cholelithiasis, suspect acute on chronic cholecystitis Patient's history, labs, and imaging reviewed by me are consistent with symptomatic gallbladder disease.  The operative and nonoperative management of biliary colic and cholecystitis were discussed with the patient in detail.  The risks of surgery including bleeding, infection, conversion to open, damage to surrounding structures, need for additional procedures, and prolonged hospital stay were discussed with the patient.  I welcomed her questions and answered them.  She would like to proceed with laparoscopic cholecystectomy.   FEN -NPO, IVF VTE -SCDs ID -Rocephin on-call to [place] -to [facility] for observation, possible discharge home from PACU  Hypertension Hyperlipidemia Past medical history peptic  ulcers Diabetes mellitus type 2  I reviewed nursing notes, ED provider notes, last 24 h vitals and pain scores, last 48 h intake and output, last 24 h labs and trends, and last 24 h imaging results.  Jill Alexanders,  PA-C [facility] 10/18/2021, 3:01 PM Please see Amion for pager number during day hours 7:00am-4:30pm or 7:00am -11:30am on weekends

## 2021-10-18 NOTE — Anesthesia Preprocedure Evaluation (Addendum)
Anesthesia Evaluation  Patient identified by MRN, date of birth, ID band Patient awake    Reviewed: Allergy & Precautions, NPO status , Patient's Chart, lab work & pertinent test results  Airway Mallampati: I  TM Distance: >3 FB Neck ROM: Full    Dental no notable dental hx. (+) Dental Advisory Given, Teeth Intact   Pulmonary neg pulmonary ROS,    Pulmonary exam normal breath sounds clear to auscultation       Cardiovascular negative cardio ROS Normal cardiovascular exam Rhythm:Regular Rate:Normal     Neuro/Psych  Headaches, PSYCHIATRIC DISORDERS Anxiety    GI/Hepatic Neg liver ROS, PUD, GERD  Controlled and Medicated,Acute cholecystitis    Endo/Other  diabetes (prediabetic )  Renal/GU negative Renal ROS  negative genitourinary   Musculoskeletal negative musculoskeletal ROS (+)   Abdominal   Peds  Hematology negative hematology ROS (+) Hb 13.2, plt 338   Anesthesia Other Findings   Reproductive/Obstetrics negative OB ROS                           Anesthesia Physical Anesthesia Plan  ASA: 2  Anesthesia Plan: General   Post-op Pain Management: Tylenol PO (pre-op)* and Toradol IV (intra-op)*   Induction: Intravenous  PONV Risk Score and Plan: 4 or greater and Ondansetron, Dexamethasone, Midazolam, Scopolamine patch - Pre-op and Treatment may vary due to age or medical condition  Airway Management Planned: Oral ETT  Additional Equipment: None  Intra-op Plan:   Post-operative Plan: Extubation in OR  Informed Consent: I have reviewed the patients History and Physical, chart, labs and discussed the procedure including the risks, benefits and alternatives for the proposed anesthesia with the patient or authorized representative who has indicated his/her understanding and acceptance.     Dental advisory given  Plan Discussed with: CRNA  Anesthesia Plan Comments:          Anesthesia Quick Evaluation

## 2021-10-18 NOTE — ED Provider Notes (Signed)
Emergency Department Provider Note   I have reviewed the triage vital signs and the nursing notes.   HISTORY  Chief Complaint Abdominal Pain   HPI Rachel [name] is a 46 y.o. female with past history reviewed below presents to the emergency department for evaluation of worsening right upper quadrant abdominal pain with nausea and vomiting.  She was seen on 7/31 by her PCP with the right upper quadrant pain.  She had an ultrasound as an outpatient showing gallstones but no acute cholecystitis.  She was referred for outpatient surgical evaluation and has an appointment on Friday but noticed in the past 24 hours her pain is worsened significantly.  She is having right upper quadrant pain radiating to the back along with nausea and vomiting.  No fevers.  Mild diarrhea as well.  No lower abdominal discomfort.   Past Medical History:  Diagnosis Date   Anxiety    Familial hyperlipidemia 08/17/2015   Migraine without aura, not intractable, without status migrainosus 12/07/2020   Migraines    Orthostatic hypotension 08/17/2015   Prediabetes    PUD (peptic ulcer disease)     Review of Systems  Constitutional: No fever/chills Eyes: No visual changes. ENT: No sore throat. Cardiovascular: Denies chest pain. Respiratory: Denies shortness of breath. Gastrointestinal: Positive RUQ abdominal pain. Positive nausea, vomiting, and diarrhea.  No constipation. Genitourinary: Negative for dysuria. Musculoskeletal: Negative for back pain. Skin: Negative for rash. Neurological: Negative for headaches, focal weakness or numbness.   ____________________________________________   PHYSICAL EXAM:  VITAL SIGNS: ED Triage Vitals  Enc Vitals Group     BP 10/18/21 1042 125/83     Pulse Rate 10/18/21 1042 100     Resp 10/18/21 1042 (!) 24     Temp 10/18/21 1042 98.2 F (36.8 C)     Temp src --      SpO2 10/18/21 1042 100 %     Weight 10/18/21 1043 168 lb (76.2 kg)     Height 10/18/21 1043 5\' 6"   (1.676 m)    Constitutional: Alert and oriented.  Patient is holding her abdomen and appears uncomfortable. Eyes: Conjunctivae are normal.  Head: Atraumatic. Nose: No congestion/rhinnorhea. Mouth/Throat: Mucous membranes are moist.  Neck: No stridor.  Cardiovascular: Normal rate, regular rhythm. Good peripheral circulation. Grossly normal heart sounds.   Respiratory: Normal respiratory effort.  No retractions. Lungs CTAB. Gastrointestinal: Soft with focal right upper quadrant tenderness and positive Murphy sign.  Meaning abdominal quadrants are nontender throughout. No distention.  Musculoskeletal:  No gross deformities of extremities. Neurologic:  Normal speech and language.  Skin:  Skin is warm, dry and intact. No rash noted.   ____________________________________________   LABS (all labs ordered are listed, but only abnormal results are displayed)  Labs Reviewed  COMPREHENSIVE METABOLIC PANEL - Abnormal; Notable for the following components:      Result Value   Chloride 97 (*)    Glucose, Bld 232 (*)    ALT 62 (*)    All other components within normal limits  CBC - Abnormal; Notable for the following components:   MCV 79.5 (*)    All other components within normal limits  LIPASE, BLOOD  URINALYSIS, ROUTINE W REFLEX MICROSCOPIC  PREGNANCY, URINE  HCG, SERUM, QUALITATIVE   ____________________________________________  RADIOLOGY  Abdomen Limited RUQ (LIVER/GB)  Result Date: 10/18/2021 CLINICAL DATA:  Right upper quadrant pain. EXAM: ULTRASOUND ABDOMEN LIMITED RIGHT UPPER QUADRANT COMPARISON:  10/16/2021 FINDINGS: Gallbladder: Gallbladder is nondistended as patient has not had an  appropriate period of fasting prior to this exam. Multiple gallstones evident, as noted on the recent comparison study. Gallbladder wall thickness is 4-5 mm, nonspecific given the gallbladder nondistention. Sonographer reports no sonographic Murphy sign. Common bile duct: Diameter: 4 mm Liver: No  focal lesion identified. Within normal limits in parenchymal echogenicity. Portal vein is patent on color Doppler imaging with normal direction of blood flow towards the liver. Other: None. IMPRESSION: 1. Cholelithiasis. Gallbladder wall thickening seen on today's study may well be related to the under distention of the gallbladder in this patient without an appropriate period of fasting prior to the study. If there is clinical concern for cystic duct obstruction, nuclear scintigraphy may prove helpful to further evaluate. Electronically Signed   By: Kennith Center M.D.   On: 10/18/2021 12:37    ____________________________________________   PROCEDURES  Procedure(s) performed:   Procedures  None  ____________________________________________   INITIAL IMPRESSION / ASSESSMENT AND PLAN / ED COURSE  Pertinent labs & imaging results that were available during my care of the patient were reviewed by me and considered in my medical decision making (see chart for details).   This patient is Presenting for Evaluation of abdominal pain, which does require a range of treatment options, and is a complaint that involves a high risk of morbidity and mortality.  The Differential Diagnoses includes but is not exclusive to acute cholecystitis, intrathoracic causes for epigastric abdominal pain, gastritis, duodenitis, pancreatitis, small bowel or large bowel obstruction, abdominal aortic aneurysm, hernia, gastritis, etc.   Critical Interventions-    Medications  fentaNYL (SUBLIMAZE) injection 50 mcg (has no administration in time range)  fentaNYL (SUBLIMAZE) injection 50 mcg (50 mcg Intravenous Given 10/18/21 1129)  ondansetron (ZOFRAN) injection 4 mg (4 mg Intravenous Given 10/18/21 1127)    Reassessment after intervention: Pain improved but continued.     I decided to review pertinent External Data, and in summary patient seen by her PCP on 7/31 with outpatient US performed that day as well.     Clinical Laboratory Tests Ordered, included no leukocytosis.  ALT elevated slightly to 62 with normal bilirubin.  No acute kidney injury.  Lipase normal.   Radiologic Tests Ordered, included RUQ [place]. I independently interpreted the images and agree with radiology interpretation.   Cardiac Monitor Tracing which shows NSR.   Social Determinants of Health Risk no smoking history.    Consult complete with general surgery, Dr. Janee Morn.  Discussed the case and ultrasound.  They are requesting transfer to the preop area at [facility] for evaluation.  Will arrange this with [facility] for transport.   Medical Decision Making: Summary:  Patient presents emergency department continued right upper quadrant abdominal pain.  Symptoms seem concerning for symptomatic cholelithiasis although does have Murphy sign here.  We will repeat ultrasound to see if she has developed acute cholecystitis, follow labs, speak to general surgery on-call.  Reevaluation with update and discussion with patient and husband at bedside.  Discussed ultrasound finding and plan for transfer to the preop area at [facility].  She is in agreement.  Disposition: admit  ____________________________________________  FINAL CLINICAL IMPRESSION(S) / ED DIAGNOSES  Final diagnoses:  Symptomatic cholelithiasis     Note:  This document was prepared using Dragon voice recognition software and may include unintentional dictation errors.  Alona Bene, MD, [facility] Emergency Medicine    Tejasvi Brissett, Arlyss Repress, MD 10/18/21 [phone]

## 2021-10-18 NOTE — ED Notes (Signed)
Pt asked to provide a urine sample. Pt given pain medicine, but stated that she was experiencing dizziness and unable to provide a sample at this time. Informed Lupita Leash - RN.

## 2021-10-18 NOTE — ED Triage Notes (Signed)
Pt. States she saw PCP on Monday and was referred to [facility]. States pain has progressed and is now 10/10. States pain is located RUQ pain radiating to back. States Stabbing pain in back. States she is having vomiting and diarrhea.

## 2021-10-18 NOTE — Op Note (Signed)
  10/18/2021  4:34 PM  PATIENT:  Rachel [name]  46 y.o. female  PRE-OPERATIVE DIAGNOSIS:  Acute Cholecystitis  POST-OPERATIVE DIAGNOSIS:  Acute Cholecystitis  PROCEDURE:  Procedure(s): LAPAROSCOPIC CHOLECYSTECTOMY  SURGEON:  Violeta Gelinas. MD  ASSISTANTS: Kris Mouton, MD; Jeronimo Greaves, RNFA   ANESTHESIA:   local and general  EBL:  Total I/O In: 100 [IV Piggyback:100] Out: -   BLOOD ADMINISTERED:none  DRAINS: none   SPECIMEN:  Excision  DISPOSITION OF SPECIMEN:  PATHOLOGY  COUNTS:  YES  DICTATION: .Dragon Dictation Procedure in detail: Informed consent was obtained.  She received intravenous antibiotics.  She was brought to the operating room and general endotracheal anesthesia was administered by the anesthesia staff.  Her abdomen was prepped and draped in a sterile fashion.  We did a timeout procedure.The infraumbilical region was infiltrated with local. Infraumbilical incision was made. Subcutaneous tissues were dissected down revealing the anterior fascia. This was divided sharply along the midline. Peritoneal cavity was entered under direct vision without complication. A 0 Vicryl pursestring was placed around the fascial opening. Hassan trocar was inserted into the abdomen. The abdomen was insufflated with carbon dioxide in standard fashion. Under direct vision a 5 mm epigastric and two 5 mm right abdominal ports were placed.  Local was used at each port site.  Laparoscopic exploration revealed an inflamed gallbladder.  The dome was retracted superior medially.  The infundibulum was retracted inferior and laterally.  Dissection began laterally and progressed medially identifying the cystic duct and the cystic artery.  Dissection continued until I had a critical view of safety around the cystic duct.  Once this was accomplished, 3 clips were placed proximally and one was placed distally and it was divided.  The cystic artery was clipped twice proximally, once distally and  divided.  The gallbladder was taken off the liver bed using Bovie cautery.  We achieved excellent hemostasis.  The gallbladder was placed in a bag and removed from the abdomen.  It was sent to pathology.  The liver bed was irrigated hemostasis was ensured.  Clips were all in good position.  Four-quadrant inspection revealed no complicating features.  Irrigation fluid was evacuated.  Ports were removed under direct vision.  Pneumoperitoneum was released.  Infraumbilical fascia was closed by tying the pursestring.  All 4 wounds were irrigated and the skin of each was closed with 4-0 Vicryl followed by Dermabond.  All counts were correct.  She tolerated the procedure well without apparent complication and was taken recovery in stable condition.  PATIENT DISPOSITION:  PACU - hemodynamically stable.   Delay start of Pharmacological VTE agent (>24hrs) due to surgical blood loss or risk of bleeding:  no  Violeta Gelinas, MD, MPH, FACS Pager: [phone]  8/2/20234:34 PM

## 2021-10-19 ENCOUNTER — Encounter (HOSPITAL_COMMUNITY): Payer: Self-pay | Admitting: General Surgery

## 2021-10-19 NOTE — Anesthesia Postprocedure Evaluation (Signed)
Anesthesia Post Note  Patient: Rachel [name]  Procedure(s) Performed: LAPAROSCOPIC CHOLECYSTECTOMY (Abdomen)     Patient location during evaluation: PACU Anesthesia Type: General Level of consciousness: sedated and patient cooperative Pain management: pain level controlled Vital Signs Assessment: post-procedure vital signs reviewed and stable Respiratory status: spontaneous breathing Cardiovascular status: stable Anesthetic complications: no   No notable events documented.  Last Vitals:  Vitals:   10/18/21 1730 10/18/21 1745  BP: (!) 142/84 138/87  Pulse: 62 62  Resp: 15 11  Temp:  36.6 C  SpO2: 97% 97%    Last Pain:  Vitals:   10/18/21 1745  PainSc: 4                  Tre Sanker [employer]

## 2021-10-20 ENCOUNTER — Other Ambulatory Visit (HOSPITAL_BASED_OUTPATIENT_CLINIC_OR_DEPARTMENT_OTHER): Payer: Self-pay

## 2021-10-20 LAB — ACTH: C206 ACTH: 18 pg/mL (ref 6–50)

## 2021-10-20 LAB — SURGICAL PATHOLOGY

## 2021-10-30 ENCOUNTER — Other Ambulatory Visit (HOSPITAL_BASED_OUTPATIENT_CLINIC_OR_DEPARTMENT_OTHER): Payer: Self-pay

## 2021-11-22 ENCOUNTER — Other Ambulatory Visit (HOSPITAL_BASED_OUTPATIENT_CLINIC_OR_DEPARTMENT_OTHER): Payer: Self-pay

## 2021-11-29 ENCOUNTER — Other Ambulatory Visit: Payer: Self-pay

## 2021-11-29 DIAGNOSIS — R140 Abdominal distension (gaseous): Secondary | ICD-10-CM

## 2021-11-29 DIAGNOSIS — K5900 Constipation, unspecified: Secondary | ICD-10-CM

## 2021-12-04 ENCOUNTER — Encounter: Payer: Self-pay | Admitting: Gastroenterology

## 2021-12-11 ENCOUNTER — Encounter: Payer: Self-pay | Admitting: Family Medicine

## 2021-12-11 ENCOUNTER — Other Ambulatory Visit (HOSPITAL_BASED_OUTPATIENT_CLINIC_OR_DEPARTMENT_OTHER): Payer: Self-pay

## 2021-12-11 ENCOUNTER — Telehealth (INDEPENDENT_AMBULATORY_CARE_PROVIDER_SITE_OTHER): Payer: No Typology Code available for payment source | Admitting: Family Medicine

## 2021-12-11 ENCOUNTER — Encounter: Payer: Self-pay | Admitting: *Deleted

## 2021-12-11 VITALS — Temp 101.5°F | Ht 66.0 in | Wt 168.0 lb

## 2021-12-11 DIAGNOSIS — J0100 Acute maxillary sinusitis, unspecified: Secondary | ICD-10-CM

## 2021-12-11 MED ORDER — AMOXICILLIN-POT CLAVULANATE 875-125 MG PO TABS
1.0000 | ORAL_TABLET | Freq: Two times a day (BID) | ORAL | 0 refills | Status: AC
  Filled 2021-12-11: qty 20, 10d supply, fill #0

## 2021-12-11 NOTE — Progress Notes (Signed)
Virtual Visit via Video Note  Subjective  CC:  Chief Complaint  Patient presents with   Sore Throat    Pt stated that she came down with a sore throat over the weekend. Not so much of a cough but some headeache.      I connected with [name] on 12/11/21 at 11:00 AM EDT by a video enabled telemedicine application and verified that I am speaking with the correct person using two identifiers. Location patient: Home Location provider: [facility] [place], Office Persons participating in the virtual visit: Laurin Tissue, Leamon Arnt, MD patricia kennedy CMA  I discussed the limitations of evaluation and management by telemedicine and the availability of in person appointments. The patient expressed understanding and agreed to proceed. HPI: Rachel [name] is a 46 y.o. female who was contacted today to address the problems listed above in the chief complaint. Purulent nasal drainage with sinus pressure, fever to 101.5, sore throat and malaise.  Started 48 hours ago.  Was exposed to sick people at work.  COVID testing negative today.  No nausea vomiting or GI symptoms.  Infections the last was over a year ago.  Feels the same.  No cough or shortness of breath.  Appetite is depressed but taking in fluids well.  Assessment  1. Acute non-recurrent maxillary sinusitis      Plan  Sinusitis: Treat with Augmentin.  Discussed possibility of early viral infection.  Treat supportively. I discussed the assessment and treatment plan with the patient. The patient was provided an opportunity to ask questions and all were answered. The patient agreed with the plan and demonstrated an understanding of the instructions.   The patient was advised to call back or seek an in-person evaluation if the symptoms worsen or if the condition fails to improve as anticipated. Follow up: As needed Visit date not found  Meds ordered this encounter  Medications   amoxicillin-clavulanate  (AUGMENTIN) 875-125 MG tablet    Sig: Take 1 tablet by mouth 2 (two) times daily for 10 days.    Dispense:  20 tablet    Refill:  0      I reviewed the patients updated PMH, FH, and SocHx.    Patient Active Problem List   Diagnosis Date Noted   Controlled type 2 diabetes mellitus without complication, without long-term current use of insulin ([place]) 08/10/2020    Priority: High   Hyperlipidemia associated with type 2 diabetes mellitus ([place]) 03/29/2020    Priority: High   Familial hyperlipidemia 08/17/2015    Priority: High   GERD (gastroesophageal reflux disease) 08/10/2020    Priority: Medium    History of Helicobacter pylori infection 08/10/2020    Priority: Medium    History of peptic ulcer 08/10/2020    Priority: Medium    Adjustment reaction with anxiety 06/26/2017    Priority: Medium    Chronic migraine 12/28/2016    Priority: Medium    Insomnia 11/01/2015    Priority: Medium    Orthostatic hypotension 08/17/2015    Priority: Medium    TMJ (dislocation of temporomandibular joint) 01/10/2021   Menorrhagia with regular cycle 12/07/2020   Migraine without aura, not intractable, without status migrainosus 12/07/2020   Myalgia due to statin 09/02/2020   Current Meds  Medication Sig   amoxicillin-clavulanate (AUGMENTIN) 875-125 MG tablet Take 1 tablet by mouth 2 (two) times daily for 10 days.   Evolocumab (REPATHA SURECLICK) 284 MG/ML SOAJ Inject 1 pen into  the skin every 14 (fourteen) days.   ezetimibe (ZETIA) 10 MG tablet Take 1 tablet (10 mg total) by mouth daily.   fludrocortisone (FLORINEF) 0.1 MG tablet Take 1 tablet (0.1 mg total) by mouth daily.   fluticasone (FLONASE) 50 MCG/ACT nasal spray Place 1 spray into both nostrils 2 (two) times daily.   glucose blood (FREESTYLE LITE) test strip Use once daily to check blood sugar R73.03   glucose monitoring kit (FREESTYLE) monitoring kit 1 each by Does not apply route daily.   hydrocortisone (CORTEF) 5 MG tablet Take 2  tablets in am and 1 in pm, by mouth.   ibuprofen (ADVIL) 200 MG tablet Take 200 mg by mouth every 6 (six) hours as needed.   iron polysaccharides (NIFEREX) 150 MG capsule Take 1 capsule (150 mg total) by mouth daily.   Lancets MISC Use to check blood sugars daily. Please provide brand covered by insurance Dx code: R73.03   meclizine (ANTIVERT) 25 MG tablet Take 1 tablet (25 mg total) by mouth 3 (three) times daily as needed for dizziness.   midodrine (PROAMATINE) 2.5 MG tablet Take 1 tablet (2.5 mg total) by mouth 3 (three) times daily with meals.   Multiple Vitamin (MULTIVITAMIN WITH MINERALS) TABS tablet Take 1 tablet by mouth daily.   oxyCODONE (OXY IR/ROXICODONE) 5 MG immediate release tablet Take 1 tablet (5 mg total) by mouth every 6 (six) hours as needed for severe pain.   pantoprazole (PROTONIX) 20 MG tablet Take 1 tablet (20 mg total) by mouth 2 (two) times daily.   rizatriptan (MAXALT-MLT) 10 MG disintegrating tablet Take 1 tablet (10 mg total) by mouth as needed for migraine. May repeat in 2 hours if needed   rosuvastatin (CRESTOR) 5 MG tablet Take 1 tablet (5 mg total) by mouth daily.    Allergies: Patient has No Known Allergies. Family History: Patient family history includes Breast cancer in her paternal aunt; Heart disease in her sister; Hyperlipidemia in her daughter, mother, sister, and another family member; Spina bifida in her son. Social History:  Patient  reports that she has never smoked. She has never used smokeless tobacco. She reports that she does not drink alcohol and does not use drugs.  Review of Systems: Constitutional: Negative for fever malaise or anorexia Cardiovascular: negative for chest pain Respiratory: negative for SOB or persistent cough Gastrointestinal: negative for abdominal pain  OBJECTIVE Vitals: Temp (!) 101.5 F (38.6 C)   Ht $R'5\' 6"'Xp$  (1.676 m)   Wt 168 lb (76.2 kg)   BMI 27.12 kg/m  General: no acute distress , A&Ox3 Very congested,  nontoxic but ill-appearing.  No respiratory distress  Leamon Arnt, MD

## 2021-12-22 ENCOUNTER — Encounter: Payer: Self-pay | Admitting: Internal Medicine

## 2021-12-22 ENCOUNTER — Ambulatory Visit (INDEPENDENT_AMBULATORY_CARE_PROVIDER_SITE_OTHER): Payer: No Typology Code available for payment source | Admitting: Internal Medicine

## 2021-12-22 VITALS — BP 106/78 | HR 83 | Ht 66.0 in | Wt 167.0 lb

## 2021-12-22 DIAGNOSIS — I951 Orthostatic hypotension: Secondary | ICD-10-CM

## 2021-12-22 DIAGNOSIS — R5382 Chronic fatigue, unspecified: Secondary | ICD-10-CM | POA: Diagnosis not present

## 2021-12-22 NOTE — Progress Notes (Signed)
Patient ID: Rachel [name], female   DOB: 23-Dec-1975, 46 y.o.   MRN: [id number]  HPI  Rachel [name] is a 46 y.o.-year-old female, referred by her PCP, Dr. Jonni Sanger, returning for follow-up for fatigue and orthostatic hypotension, with suspicion for adrenal insufficiency.  Last visit 3 months ago.  Interim History: Patient denies weight loss, abdominal pain, nausea, dizziness, anorexia. She describes that she went to [facility] right after our last OV >> developed nausea/vomiting/dizziness >> dx'ed with cholecystitis >> cut her trip short >> had GB surgery here in 10/2021.  However, due to her GI symptoms, she could not take the hydrocortisone and was not able to obtain it parenterally - she only took it for 3-4 days in [place] and stopped >> now off for the last 2 months.  She is also off the fludrocortisone. She feels much better now, with all the previous symptoms resolved.  Reviewed and addended history: Patient describes an episode of significant fatigue, lightheadedness, dizziness and dehydration in 2020.  Blood pressure was found to be low.  At that time, she saw cardiology >> investigation was reportedly negative, but she was started on Florinef for hypotension. She felt better afterwards. Approx. 2 mo ago, she developed again the above sxs >> episodes of significant fatigue, paleness, dizziness sBP 80-90s/dBP 60s - when standing up. However, even when she sits down, BP is approx the same. Occasional palpitations.  No syncopal episodes.  No nausea or vomiting.  She has irregular menses. No unintentional weight loss.  She started [facility] in 04/2021 >> stopped 07/2021 >> lost 20 lbs (5 mg weekly dose).  She did gain some weight afterwards. 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 She had vestibular rehab in 07/2021.  She has darkening of her skin around her umbilicus, bilateral hands knuckles, and neck creases.  PCP checked a cortisol and ACTH level and these were normal. She saw cardiology and they  added midodrine 2.5 mg 3 times a day - she just picked this up before our visit in 09/2021.  Cardiology recommended 64 ounces of water per day + electrolyte solutions. PCP also suggested dexamethasone 4 mg daily last mo.- she used this x1 week - she initially felt better, esp. In am >> energy started to decrease mid-day >> stopped.  No h/o steroid use in the past except for Flonase - not in 2 years. No h/o Depo-provera, Megace, po ketoconazole, phenytoin, rifampin, chronic fluconazole use. No h/o autoimmune diseases in pt or family mbs. No excess use of NSAIDs. No h/o generalized infections or HIV. No IVDA. No h/o head injury or severe HA, but does have a history of migraines. No h/o malignancy. No previous adrenal imaging.  At last visit, due to the suspicion for adrenal insufficiency and the fact that we could not test her in more detail as she was leaving the country the next day, we started: - hydrocortisone 10 mg in a.m. and 5 mg in p.m.  Reviewed pertinent labs: ACTH and cortisol levels were normal (on fludrocortisone): Component     Latest Ref Rng 08/18/2021 08/28/2021  Cortisol, Plasma     ug/dL - 8 am 7.7    C206 ACTH - 8 am     6 - 50 pg/mL  18   Cosyntropin stimulation test (not on hydrocortisone or fludrocortisone) was normal: Component     Latest Ref Rng 10/18/2021.  C206 ACTH     6 - 50 pg/mL 18   Cortisol, Plasma     ug/dL 27.5  Cortisol, Plasma      22.0   Cortisol, Plasma      8.0    No h/o hyponatremia or hyperkalemia.   Chemistry      Component Value Date/Time   NA 138 10/18/2021 1055   NA 139 07/15/2018 1635   K 3.9 10/18/2021 1055   CL 97 (L) 10/18/2021 1055   CO2 27 10/18/2021 1055   BUN 9 10/18/2021 1055   BUN 8 07/15/2018 1635   CREATININE 0.56 10/18/2021 1055   CREATININE 0.45 (L) 07/20/2015 1048      Component Value Date/Time   CALCIUM 9.8 10/18/2021 1055   ALKPHOS 91 10/18/2021 1055   AST 23 10/18/2021 1055   ALT 62 (H) 10/18/2021 1055    BILITOT 0.9 10/18/2021 1055   BILITOT 0.8 07/15/2018 1635     She has a history of diabetes, for which she was on Mounjaro but she stopped due to the above symptoms.  She is also off metformin.  No hypoglycemia.  Last HbA1c was at goal: Lab Results  Component Value Date   HGBA1C 5.2 08/23/2021  She has a history of familial hyperlipidemia for which she is on Repatha, Zetia, and Crestor.  She has slight joint pains with statin She has a history of iron deficiency anemia, for which she is on iron therapy.  She has a history of menorrhagia and was found to be premenopausal when an [facility] 05/27/2019 last year. She also has a history of peptic ulcer disease-on Protonix. She has a history of anxiety, but not depression.  ROS: + see HPI  Past Medical History:  Diagnosis Date   Anxiety    Diabetes mellitus without complication ([place])    type 2   Familial hyperlipidemia 08/17/2015   Migraine without aura, not intractable, without status migrainosus 12/07/2020   Migraines    Orthostatic hypotension 08/17/2015   Prediabetes    PUD (peptic ulcer disease)    Past Surgical History:  Procedure Laterality Date   CESAREAN SECTION     CHOLECYSTECTOMY N/A 10/18/2021   Procedure: LAPAROSCOPIC CHOLECYSTECTOMY;  Surgeon: Georganna Skeans, MD;  Location: [place];  Service: General;  Laterality: N/A;   Social History   Socioeconomic History   Marital status: Married    Spouse name: Not on file   Number of children: 2   Years of education: Not on file   Highest education level: Not on file  Occupational History   Occupation: clinical supervisor    Employer: [employer]  Tobacco Use   Smoking status: Never   Smokeless tobacco: Never  Vaping Use   Vaping Use: Never used  Substance and Sexual Activity   Alcohol use: No   Drug use: No   Sexual activity: Yes    Birth control/protection: Condom  Other Topics Concern   Not on file  Social History Narrative   Lives with her husband and their 2  children   Her son is wheelchair bound due to spina bifida   Social Determinants of Radio broadcast assistant Strain: Not on file  Food Insecurity: Not on file  Transportation Needs: Not on file  Physical Activity: Not on file  Stress: Not on file  Social Connections: Not on file  Intimate Partner Violence: Not on file   Current Outpatient Medications on File Prior to Visit  Medication Sig Dispense Refill   Evolocumab (REPATHA SURECLICK) 569 MG/ML SOAJ Inject 1 pen into the skin every 14 (fourteen) days. 2 mL 11   ezetimibe (  ZETIA) 10 MG tablet Take 1 tablet (10 mg total) by mouth daily. 90 tablet 3   fludrocortisone (FLORINEF) 0.1 MG tablet Take 1 tablet (0.1 mg total) by mouth daily. 90 tablet 3   fluticasone (FLONASE) 50 MCG/ACT nasal spray Place 1 spray into both nostrils 2 (two) times daily. 16 g 6   glucose blood (FREESTYLE LITE) test strip Use once daily to check blood sugar R73.03 100 each 4   glucose monitoring kit (FREESTYLE) monitoring kit 1 each by Does not apply route daily. 1 each 0   hydrocortisone (CORTEF) 5 MG tablet Take 2 tablets in am and 1 in pm, by mouth. 270 tablet 1   ibuprofen (ADVIL) 200 MG tablet Take 200 mg by mouth every 6 (six) hours as needed.     iron polysaccharides (NIFEREX) 150 MG capsule Take 1 capsule (150 mg total) by mouth daily. 60 capsule 2   Lancets MISC Use to check blood sugars daily. Please provide brand covered by insurance Dx code: R73.03 100 each 3   meclizine (ANTIVERT) 25 MG tablet Take 1 tablet (25 mg total) by mouth 3 (three) times daily as needed for dizziness. 30 tablet 0   midodrine (PROAMATINE) 2.5 MG tablet Take 1 tablet (2.5 mg total) by mouth 3 (three) times daily with meals. 270 tablet 3   Multiple Vitamin (MULTIVITAMIN WITH MINERALS) TABS tablet Take 1 tablet by mouth daily.     oxyCODONE (OXY IR/ROXICODONE) 5 MG immediate release tablet Take 1 tablet (5 mg total) by mouth every 6 (six) hours as needed for severe pain. 15  tablet 0   pantoprazole (PROTONIX) 20 MG tablet Take 1 tablet (20 mg total) by mouth 2 (two) times daily. 60 tablet 2   rizatriptan (MAXALT-MLT) 10 MG disintegrating tablet Take 1 tablet (10 mg total) by mouth as needed for migraine. May repeat in 2 hours if needed 10 tablet 2   rosuvastatin (CRESTOR) 5 MG tablet Take 1 tablet (5 mg total) by mouth daily. 90 tablet 3   No current facility-administered medications on file prior to visit.   No Known Allergies Family History  Problem Relation Age of Onset   Spina bifida Son    Hyperlipidemia Daughter    Hyperlipidemia Mother    Heart disease Sister    Hyperlipidemia Sister    Hyperlipidemia Other    Breast cancer Paternal Aunt    Cancer Neg Hx     PE: BP 106/78 (BP Location: Left Arm, Patient Position: Sitting, Cuff Size: Normal)   Pulse 83   Ht $R'5\' 6"'GL$  (1.676 m)   Wt 167 lb (75.8 kg)   SpO2 100%   BMI 26.95 kg/m  Wt Readings from Last 3 Encounters:  12/22/21 167 lb (75.8 kg)  12/11/21 168 lb (76.2 kg)  10/18/21 168 lb (76.2 kg)   Constitutional: Slightly overweight, in NAD Eyes: EOMI, no exophthalmos ENT: no thyromegaly, no cervical lymphadenopathy Cardiovascular: RRR, No MRG Respiratory: CTA B Musculoskeletal: no deformities Skin: + dark discoloration of skin around her umbilicus, also bilateral hands knuckles and neck creases Neurological: no tremor with outstretched hands  ASSESSMENT: 1.  Fatigue  2. Orthostatic hypotension  PLAN:  And 2. We discussed about possible causes for fatigue at last visit and we ruled out several possible etiologies: - diet - she is not on a restrictive diet; no strict vegan/keto/high fat diet - sedentarism - she is active throughout the day - insomnia - she sleeps 2-3h a night as she has to take  care of her son who has spina bifida - depression - denies - menopause/perimenopause -she has irregular menstrual cycles, which was in the perimenopausal range: Component     Latest Ref Rng  11/25/2020  [place]     mIU/ML 21.1   - polypharmacy  - certain medications -not on a beta-blocker or medications known to cause fatigue - vitamin D def -no recent vitamin D levels available for review: No results found for: "VD25OH" - vitamin B12 def -latest B12 level was normal Lab Results  Component Value Date   VITAMINB12 614 08/09/2021  - anemia - last Hb normal, on iron, previously with mild anemia: Lab Results  Component Value Date   WBC 7.8 10/18/2021   HGB 13.2 10/18/2021   HCT 40.0 10/18/2021   MCV 79.5 (L) 10/18/2021   PLT 338 10/18/2021  - hypothyroidism - last TSH normal: Lab Results  Component Value Date   TSH 1.43 08/09/2021  - adrenal insufficiency -she did have orthostatic hypotension, headaches (chronic-migraines), also skin discoloration at the site of creases, but no unintentional weight loss (weight loss stopped after stopping Mounjaro), no nausea or vomiting, no exogenous steroid use other than distant use of Flonase, no hyponatremia or hyperkalemia.  Moreover, an ACTH and cortisol levels checked in 08/2021 were normal.  She was on fludrocortisone at that time but it would be very unlikely for this to influence the results.  At that time, I could not rule out adrenal insufficiency.  She was preparing to leave the country the next day so I had to start her on hydrocortisone, 10 mg in a.m. and 5 mg in p.m., and discussed about stress dosing and given sick day instructions.  As soon as she returned to the country, I had her come back to the clinic for a cosyntropin stimulation test.  This was normal. -At this visit, however, patient describes that when she got to [place], she developed nausea and vomiting and was not able to keep the Hydrocortisone tablets down.  She did not remember the instructions to go to the emergency room or urgent care to have parenteral steroids but she was not able to obtain these (!).  Therefore, she stopped hydrocortisone completely.  She was diagnosed  with cholecystitis and was recommended gallbladder surgery.  She traveled back to the [place] and had cholecystectomy here.  She realized after having the surgery that her symptoms have resolved.  As of now, she is off the steroids completely and also off fludrocortisone, feeling well. -At this point, it looks like bladder disease was the likely culprit for her symptoms.  No further investigation or treatment needed for now. -I will have her return to clinic as needed.  - Total time spent for the visit: 25 min, in precharting, reviewing previous investigation, obtaining medical information from the chart and from the pt, reviewing her  previous labs, evaluations, and treatments, reviewing her symptoms, and discussing about her previous diagnosis of suspected adrenal insufficiency, which was ruled out.  Philemon Kingdom, MD PhD [facility] Endocrinology

## 2021-12-22 NOTE — Patient Instructions (Signed)
Please continue off steroids.  Return to see me as needed.

## 2021-12-25 ENCOUNTER — Other Ambulatory Visit (HOSPITAL_BASED_OUTPATIENT_CLINIC_OR_DEPARTMENT_OTHER): Payer: Self-pay

## 2021-12-25 ENCOUNTER — Other Ambulatory Visit: Payer: Self-pay

## 2021-12-25 DIAGNOSIS — Z309 Encounter for contraceptive management, unspecified: Secondary | ICD-10-CM

## 2021-12-25 MED ORDER — MEDROXYPROGESTERONE ACETATE 10 MG PO TABS
10.0000 mg | ORAL_TABLET | Freq: Every day | ORAL | 0 refills | Status: DC
  Filled 2021-12-25: qty 10, 10d supply, fill #0

## 2021-12-27 ENCOUNTER — Other Ambulatory Visit (HOSPITAL_BASED_OUTPATIENT_CLINIC_OR_DEPARTMENT_OTHER): Payer: Self-pay

## 2021-12-28 ENCOUNTER — Encounter: Payer: Self-pay | Admitting: Family Medicine

## 2021-12-28 ENCOUNTER — Ambulatory Visit: Payer: No Typology Code available for payment source | Admitting: Internal Medicine

## 2021-12-28 ENCOUNTER — Ambulatory Visit (INDEPENDENT_AMBULATORY_CARE_PROVIDER_SITE_OTHER): Payer: No Typology Code available for payment source | Admitting: Family Medicine

## 2021-12-28 VITALS — Temp 98.2°F | Ht 66.0 in | Wt 168.2 lb

## 2021-12-28 DIAGNOSIS — H6993 Unspecified Eustachian tube disorder, bilateral: Secondary | ICD-10-CM

## 2021-12-28 DIAGNOSIS — R599 Enlarged lymph nodes, unspecified: Secondary | ICD-10-CM

## 2021-12-28 NOTE — Progress Notes (Signed)
Subjective  CC:  Chief Complaint  Patient presents with   Ear Pain    Pt stated that she had a sinus infection about 2 weeks ago and since then had had ear pain, mostly the Rt ear which feels muffled at times. Hurts more when she sleeps on it.     HPI: Rachel [name] is a 46 y.o. female who presents to the office today to address the problems listed above in the chief complaint. Treated for acute sinusitis about 10 to 14 days ago.  Completed 10 days antibiotics.  Her symptoms have resolved however she has fullness and pressure in her right ear.  Hearing is muffled at times.  She will Valsalva and this will help in the short-term.  No severe pain or fevers.  No sore throat.  Has noted a persistent left lymph node in her neck.  No shortness of breath or cough  Assessment  1. ETD (Eustachian tube dysfunction), bilateral   2. Reactive lymphadenopathy      Plan  Eustachian tube dysfunction after sinus infection: Education given.  Start Flonase and Zyrtec nightly. Reactive lymphadenopathy: Reassured.  Should resolve.  No red flag clinical symptoms today.  Follow up: As needed Visit date not found  No orders of the defined types were placed in this encounter.  No orders of the defined types were placed in this encounter.     I reviewed the patients updated PMH, FH, and SocHx.    Patient Active Problem List   Diagnosis Date Noted   Controlled type 2 diabetes mellitus without complication, without long-term current use of insulin ([place]) 08/10/2020    Priority: High   Hyperlipidemia associated with type 2 diabetes mellitus ([place]) 03/29/2020    Priority: High   Familial hyperlipidemia 08/17/2015    Priority: High   GERD (gastroesophageal reflux disease) 08/10/2020    Priority: Medium    History of Helicobacter pylori infection 08/10/2020    Priority: Medium    History of peptic ulcer 08/10/2020    Priority: Medium    Adjustment reaction with anxiety 06/26/2017    Priority: Medium     Chronic migraine 12/28/2016    Priority: Medium    Insomnia 11/01/2015    Priority: Medium    Orthostatic hypotension 08/17/2015    Priority: Medium    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   ezetimibe (ZETIA) 10 MG tablet Take 1 tablet (10 mg total) by mouth daily.   fludrocortisone (FLORINEF) 0.1 MG tablet Take 1 tablet (0.1 mg total) by mouth daily.   fluticasone (FLONASE) 50 MCG/ACT nasal spray Place 1 spray into both nostrils 2 (two) times daily.   glucose blood (FREESTYLE LITE) test strip Use once daily to check blood sugar R73.03   glucose monitoring kit (FREESTYLE) monitoring kit 1 each by Does not apply route daily.   ibuprofen (ADVIL) 200 MG tablet Take 200 mg by mouth every 6 (six) hours as needed.   iron polysaccharides (NIFEREX) 150 MG capsule Take 1 capsule (150 mg total) by mouth daily.   Lancets MISC Use to check blood sugars daily. Please provide brand covered by insurance Dx code: R73.03   meclizine (ANTIVERT) 25 MG tablet Take 1 tablet (  25 mg total) by mouth 3 (three) times daily as needed for dizziness.   medroxyPROGESTERone (PROVERA) 10 MG tablet Take 1 tablet (10 mg total) by mouth daily for 10 days.   midodrine (PROAMATINE) 2.5 MG tablet Take 1 tablet (2.5 mg total) by mouth 3 (three) times daily with meals.   Multiple Vitamin (MULTIVITAMIN WITH MINERALS) TABS tablet Take 1 tablet by mouth daily.   pantoprazole (PROTONIX) 20 MG tablet Take 1 tablet (20 mg total) by mouth 2 (two) times daily.   rizatriptan (MAXALT-MLT) 10 MG disintegrating tablet Take 1 tablet (10 mg total) by mouth as needed for migraine. May repeat in 2 hours if needed   rosuvastatin  (CRESTOR) 5 MG tablet Take 1 tablet (5 mg total) by mouth daily.    Allergies: Patient has No Known Allergies. Family History: Patient family history includes Breast cancer in her paternal aunt; Heart disease in her sister; Hyperlipidemia in her daughter, mother, sister, and another family member; Spina bifida in her son. Social History:  Patient  reports that she has never smoked. She has never used smokeless tobacco. She reports that she does not drink alcohol and does not use drugs.  Review of Systems: Constitutional: Negative for fever malaise or anorexia Cardiovascular: negative for chest pain Respiratory: negative for SOB or persistent cough Gastrointestinal: negative for abdominal pain  Objective  Vitals: Temp 98.2 F (36.8 C)   Ht 5' 6" (1.676 m)   Wt 168 lb 3.2 oz (76.3 kg)   BMI 27.15 kg/m  General: no acute distress , A&Ox3 HEENT: PEERL, conjunctiva normal, neck is supple, TMs normal bilaterally, no external auditory canal redness erythema or tenderness.  Oropharynx is clear, less than 1 cm mildly tender left cervical lymph node present Cardiovascular:  RRR without murmur or gallop.  Respiratory:  Good breath sounds bilaterally, CTAB with normal respiratory effort Skin:  Warm, no rashes    Commons side effects, risks, benefits, and alternatives for medications and treatment plan prescribed today were discussed, and the patient expressed understanding of the given instructions. Patient is instructed to call or message via MyChart if he/she has any questions or concerns regarding our treatment plan. No barriers to understanding were identified. We discussed Red Flag symptoms and signs in detail. Patient expressed understanding regarding what to do in case of urgent or emergency type symptoms.  Medication list was reconciled, printed and provided to the patient in AVS. Patient instructions and summary information was reviewed with the patient as documented in the AVS. This  note was prepared with assistance of Dragon voice recognition software. Occasional wrong-word or sound-a-like substitutions may have occurred due to the inherent limitations of voice recognition software  This visit occurred during the SARS-CoV-2 public health emergency.  Safety protocols were in place, including screening questions prior to the visit, additional usage of staff PPE, and extensive cleaning of exam room while observing appropriate contact time as indicated for disinfecting solutions.

## 2022-01-01 ENCOUNTER — Ambulatory Visit (INDEPENDENT_AMBULATORY_CARE_PROVIDER_SITE_OTHER): Payer: No Typology Code available for payment source | Admitting: Gastroenterology

## 2022-01-01 ENCOUNTER — Encounter: Payer: Self-pay | Admitting: Gastroenterology

## 2022-01-01 VITALS — BP 80/70 | HR 90 | Ht 66.0 in | Wt 169.2 lb

## 2022-01-01 DIAGNOSIS — K5909 Other constipation: Secondary | ICD-10-CM | POA: Diagnosis not present

## 2022-01-01 DIAGNOSIS — Z1211 Encounter for screening for malignant neoplasm of colon: Secondary | ICD-10-CM

## 2022-01-01 NOTE — Patient Instructions (Signed)
Linzess 72 mcg daily before breakfast (samples provided).  Call or send MyChart message with an update in 10-12 days with an update.   Your provider has ordered Cologuard testing as an option for colon cancer screening. This is performed by [employer] and may be out of network with your insurance. PRIOR to completing the test, it is YOUR responsibility to contact your insurance about covered benefits for this test. Your out of pocket expense could be anywhere from $0.00 to $649.00.   When you call to check coverage with your insurer, please provide the following information:   -The ONLY provider of Cologuard is [place] code for Cologuard is [phone].  [employer] NPI # [id number]  -[employer] Tax ID # [id number]   We have already sent your demographic and insurance information to [employer] (phone number [phone]) and they should contact you within the next week regarding your test. If you have not heard from them within the next week, please call our office at [phone].  _______________________________________________________  If you are age 58 or older, your body mass index should be between 23-30. Your Body mass index is 27.32 kg/m. If this is out of the aforementioned range listed, please consider follow up with your Primary Care Provider.  If you are age 33 or younger, your body mass index should be between 19-25. Your Body mass index is 27.32 kg/m. If this is out of the aformentioned range listed, please consider follow up with your Primary Care Provider.   ________________________________________________________  The [facility] providers would like to encourage you to use [facility] to communicate with providers for non-urgent requests or questions.  Due to long hold times on the telephone, sending your provider a message by [facility] may be a faster and more efficient way to get a response.  Please allow 48  business hours for a response.  Please remember that this is for non-urgent requests.  _______________________________________________________

## 2022-01-01 NOTE — Progress Notes (Addendum)
01/01/2022 Rachel [name] [id number] 1975/11/21   HISTORY OF PRESENT ILLNESS: This is a 46 year old female who is new to our office.  She has been referred here by her PCP, Dr. Jonni Sanger for complaints of abdominal bloating and constipation.  The patient tells me that she has had chronic constipation for years.  She says that she had her gallbladder out at the beginning of August and then now she has had some constipation with episodes of loose stool/diarrhea.  Says for about the past 3 months has been on a gluten-free and dairy free diet.  She says that her sister was diagnosed with celiac disease, but the patient herself has never been tested.  She takes Benefiber 1 packet daily and Colace daily.  She used some mag citrate as a rescue.  She says that she tried MiraLAX in the past, but that caused bloating.  She has never had a colonoscopy in the past.  She says that in 2015 she had an EGD with [facility] and was found to have healing ulcers.  She was diagnosed with H. pylori, but then tells me a follow-up stool study was negative.  She is on pantoprazole 40 mg once daily for upper GI related issues.  She denies rectal bleeding.  She does report some right upper quadrant abdominal pain.  Once again had her gallbladder out in early August.  Has an appointment for follow-up with her surgeon tomorrow.   Past Medical History:  Diagnosis Date   Anxiety    Diabetes mellitus without complication ([place])    type 2   Familial hyperlipidemia 08/17/2015   Gallstones    Migraine without aura, not intractable, without status migrainosus 12/07/2020   Migraines    Orthostatic hypotension 08/17/2015   Prediabetes    PUD (peptic ulcer disease)    Past Surgical History:  Procedure Laterality Date   CESAREAN SECTION     CHOLECYSTECTOMY N/A 10/18/2021   Procedure: LAPAROSCOPIC CHOLECYSTECTOMY;  Surgeon: Georganna Skeans, MD;  Location: [facility];  Service: General;  Laterality: N/A;    reports that she has never  smoked. She has never used smokeless tobacco. She reports that she does not drink alcohol and does not use drugs. family history includes Breast cancer in her paternal aunt; Celiac disease in her sister; Diabetes in her sister; Heart disease in her sister; Hyperlipidemia in her daughter, mother, sister, and another family member; Irritable bowel syndrome in her sister; Spina bifida in her son. No Known Allergies    Outpatient Encounter Medications as of 01/01/2022  Medication Sig   Evolocumab (REPATHA SURECLICK) 062 MG/ML SOAJ Inject 1 pen into the skin every 14 (fourteen) days.   ezetimibe (ZETIA) 10 MG tablet Take 1 tablet (10 mg total) by mouth daily.   fludrocortisone (FLORINEF) 0.1 MG tablet Take 1 tablet (0.1 mg total) by mouth daily.   fluticasone (FLONASE) 50 MCG/ACT nasal spray Place 1 spray into both nostrils 2 (two) times daily.   glucose blood (FREESTYLE LITE) test strip Use once daily to check blood sugar R73.03   glucose monitoring kit (FREESTYLE) monitoring kit 1 each by Does not apply route daily.   hydrocortisone (CORTEF) 5 MG tablet Take 2 tablets in am and 1 in pm, by mouth.   ibuprofen (ADVIL) 200 MG tablet Take 200 mg by mouth every 6 (six) hours as needed.   Lancets MISC Use to check blood sugars daily. Please provide brand covered by insurance Dx code: R73.03   meclizine (ANTIVERT) 25  MG tablet Take 1 tablet (25 mg total) by mouth 3 (three) times daily as needed for dizziness.   medroxyPROGESTERone (PROVERA) 10 MG tablet Take 1 tablet (10 mg total) by mouth daily for 10 days.   midodrine (PROAMATINE) 2.5 MG tablet Take 1 tablet (2.5 mg total) by mouth 3 (three) times daily with meals.   Multiple Vitamin (MULTIVITAMIN WITH MINERALS) TABS tablet Take 1 tablet by mouth daily.   pantoprazole (PROTONIX) 20 MG tablet Take 1 tablet (20 mg total) by mouth 2 (two) times daily.   rizatriptan (MAXALT-MLT) 10 MG disintegrating tablet Take 1 tablet (10 mg total) by mouth as needed for  migraine. May repeat in 2 hours if needed   rosuvastatin (CRESTOR) 5 MG tablet Take 1 tablet (5 mg total) by mouth daily.   iron polysaccharides (NIFEREX) 150 MG capsule Take 1 capsule (150 mg total) by mouth daily. (Patient not taking: Reported on 01/01/2022)   oxyCODONE (OXY IR/ROXICODONE) 5 MG immediate release tablet Take 1 tablet (5 mg total) by mouth every 6 (six) hours as needed for severe pain. (Patient not taking: Reported on 01/01/2022)   No facility-administered encounter medications on file as of 01/01/2022.     REVIEW OF SYSTEMS  : All other systems reviewed and negative except where noted in the History of Present Illness.   PHYSICAL EXAM: BP (!) 80/70   Pulse 90   Ht $R'5\' 6"'cD$  (1.676 m)   Wt 169 lb 4 oz (76.8 kg)   BMI 27.32 kg/m  General: Well developed female in no acute distress Head: Normocephalic and atraumatic Eyes:  Sclerae anicteric, conjunctiva pink. Ears: Normal auditory acuity Lungs: Clear throughout to auscultation; no W/R/R. Heart: Regular rate and rhythm; no M/R/G. Abdomen: Soft, non-distended.  BS present.  Some RUQ TTP. Musculoskeletal: Symmetrical with no gross deformities  Skin: No lesions on visible extremities Extremities: No edema  Neurological: Alert oriented x 4, grossly non-focal Psychological:  Alert and cooperative. Normal mood and affect  ASSESSMENT AND PLAN: *Chronic constipation: Longstanding.  Has taken MiraLAX, but says that it caused her a lot of bloating.  Takes Benefiber 1 packet daily and some Colace.  Has used mag citrate for rescue.  We will try Linzess 72 mcg daily.  We discussed colonoscopy as she needs one for screening purposes anyways, but she declined and would like to do Cologuard for colon cancer screening.  Constipation is likely motility issue especially since its been longstanding.  Has had some intermittent loose stool/diarrhea since her cholecystectomy.  We will try Linzess 72 mcg daily for her constipation.  She was given  samples and will message [place] back in about 10 to 12 days with an update on her symptoms.  At that point we can decide what to do regards to prescription. *CRC screening: Declined colonoscopy.  We will perform Cologuard. *Right upper quadrant abdominal pain: Had cholecystectomy about 2 months ago.  Has follow-up with her surgeon tomorrow.  Is on pantoprazole 40 mg daily.  Had an EGD in 2015 with [facility] and tells me that she was found to have healing ulcers.  Had tested positive for H. pylori, was treated and stool antigen subsequently was negative.  We will try to request those records.  **Received EGD records from [facility].  Procedure date was 11/19/2013 by Dr. Paulita Fujita at which time she was found to have normal esophagus, gastritis that was biopsied, mucosal changes in the duodenum suspect from healing peptic ulcer disease.  Gastric biopsies showed H. pylori gastritis  without evidence of intestinal metaplasia, dysplasia, or malignancy.  Records being sent for scanning.  As above, she says that she was treated for the H. pylori and says that subsequent stool antigen was negative.   CC:  Leamon Arnt, MD

## 2022-01-02 ENCOUNTER — Encounter: Payer: Self-pay | Admitting: Gastroenterology

## 2022-01-02 DIAGNOSIS — K5909 Other constipation: Secondary | ICD-10-CM | POA: Insufficient documentation

## 2022-01-02 DIAGNOSIS — Z1211 Encounter for screening for malignant neoplasm of colon: Secondary | ICD-10-CM | POA: Insufficient documentation

## 2022-01-02 NOTE — Progress Notes (Signed)
Agree with assessment and plan as outlined.  

## 2022-01-08 ENCOUNTER — Encounter: Payer: Self-pay | Admitting: Internal Medicine

## 2022-01-08 ENCOUNTER — Ambulatory Visit: Payer: No Typology Code available for payment source | Attending: Internal Medicine | Admitting: Internal Medicine

## 2022-01-08 VITALS — BP 110/75 | HR 83 | Ht 66.0 in | Wt 168.0 lb

## 2022-01-08 DIAGNOSIS — E785 Hyperlipidemia, unspecified: Secondary | ICD-10-CM | POA: Diagnosis not present

## 2022-01-08 DIAGNOSIS — E7801 Familial hypercholesterolemia: Secondary | ICD-10-CM | POA: Insufficient documentation

## 2022-01-08 DIAGNOSIS — E1169 Type 2 diabetes mellitus with other specified complication: Secondary | ICD-10-CM | POA: Diagnosis not present

## 2022-01-08 DIAGNOSIS — E78019 Familial hypercholesterolemia, unspecified: Secondary | ICD-10-CM | POA: Insufficient documentation

## 2022-01-08 DIAGNOSIS — I951 Orthostatic hypotension: Secondary | ICD-10-CM

## 2022-01-08 NOTE — Patient Instructions (Signed)
Medication Instructions:  Your physician recommends that you continue on your current medications as directed. Please refer to the Current Medication list given to you today.  *If you need a refill on your cardiac medications before your next appointment, please call your pharmacy*   Lab Work: NONE  If you have labs (blood work) drawn today and your tests are completely normal, you will receive your results only by: MyChart Message (if you have MyChart) OR A paper copy in the mail If you have any lab test that is abnormal or we need to change your treatment, we will call you to review the results.   Testing/Procedures: NONE   Follow-Up: At [facility], you and your health needs are our priority.  As part of our continuing mission to provide you with exceptional heart care, we have created designated Provider Care Teams.  These Care Teams include your primary Cardiologist (physician) and Advanced Practice Providers (APPs -  Physician Assistants and Nurse Practitioners) who all work together to provide you with the care you need, when you need it.   Your next appointment:   1 year(s)  The format for your next appointment:   In Person  Provider:   Mahesh A Chandrasekhar, MD      Important Information About Sugar       

## 2022-01-08 NOTE — Progress Notes (Signed)
Cardiology Office Note:    Date:  01/08/2022   ID:  Rachel [name], DOB [birth year], MRN [id number]  PCP:  Leamon Arnt, MD  [facility] Cardiologist:  Werner Lean, MD  [place] Electrophysiologist:  None   CC:  Follow up Orthostatic Hypotension  History of Present Illness:    Rachel [name] is a 46 y.o. female with a hx of  orthostatic blood pressure, DM HLD on rosuvastatin 10 mg, who presented for evaluation 12/25/19. 2021:  In interval normal BNP and Echo.  [place] use of Florinef.  2022: Last seen 03/29/20.In interim of this visit, patient had PC MD lipids and still not at goal.  2023.  Started PCSK9i and LDL improved.  Saw Tessa and started midodrine.  Has had gall bladder removed in interim.  After this has no longer needed the midodrine.  Patient notes that she is doing well.   Since last visit notes no further symptoms with no further  the midodrine.  Went back home but had to cut things short for cholecystitis.  No chest pain or pressure .  No SOB/DOE and no PND/Orthopnea.  No weight gain or leg swelling.  No palpitations or syncope   Past Medical History:  Diagnosis Date   Anxiety    Diabetes mellitus without complication ([place])    type 2   Familial hyperlipidemia 08/17/2015   Gallstones    Migraine without aura, not intractable, without status migrainosus 12/07/2020   Migraines    Orthostatic hypotension 08/17/2015   Prediabetes    PUD (peptic ulcer disease)     Past Surgical History:  Procedure Laterality Date   CESAREAN SECTION     CHOLECYSTECTOMY N/A 10/18/2021   Procedure: LAPAROSCOPIC CHOLECYSTECTOMY;  Surgeon: Georganna Skeans, MD;  Location: [facility];  Service: General;  Laterality: N/A;    Current Medications: Current Meds  Medication Sig   Evolocumab (REPATHA SURECLICK) 268 MG/ML SOAJ Inject 1 pen into the skin every 14 (fourteen) days.   ezetimibe (ZETIA) 10 MG tablet Take 1 tablet (10 mg total) by mouth daily.   fluticasone  (FLONASE) 50 MCG/ACT nasal spray Place 1 spray into both nostrils 2 (two) times daily. (Patient taking differently: Place 1 spray into both nostrils as needed for allergies.)   glucose blood (FREESTYLE LITE) test strip Use once daily to check blood sugar R73.03   glucose monitoring kit (FREESTYLE) monitoring kit 1 each by Does not apply route daily.   ibuprofen (ADVIL) 200 MG tablet Take 200 mg by mouth every 6 (six) hours as needed.   Lancets MISC Use to check blood sugars daily. Please provide brand covered by insurance Dx code: R73.03   meclizine (ANTIVERT) 25 MG tablet Take 1 tablet (25 mg total) by mouth 3 (three) times daily as needed for dizziness.   medroxyPROGESTERone (PROVERA) 10 MG tablet Take 1 tablet (10 mg total) by mouth daily for 10 days.   midodrine (PROAMATINE) 2.5 MG tablet Take 1 tablet (2.5 mg total) by mouth 3 (three) times daily with meals.   Multiple Vitamin (MULTIVITAMIN WITH MINERALS) TABS tablet Take 1 tablet by mouth daily.   pantoprazole (PROTONIX) 20 MG tablet Take 1 tablet (20 mg total) by mouth 2 (two) times daily.   rizatriptan (MAXALT-MLT) 10 MG disintegrating tablet Take 1 tablet (10 mg total) by mouth as needed for migraine. May repeat in 2 hours if needed   rosuvastatin (CRESTOR) 5 MG tablet Take 1 tablet (5 mg total) by mouth daily.  Allergies:   Patient has no known allergies.   Social History   Socioeconomic History   Marital status: Married    Spouse name: Not on file   Number of children: 2   Years of education: Not on file   Highest education level: Not on file  Occupational History   Occupation: clinical supervisor    Employer: [employer]  Tobacco Use   Smoking status: Never   Smokeless tobacco: Never  Vaping Use   Vaping Use: Never used  Substance and Sexual Activity   Alcohol use: No   Drug use: No   Sexual activity: Yes    Birth control/protection: Condom  Other Topics Concern   Not on file  Social History Narrative   Lives  with her husband and their 2 children   Her son is wheelchair bound due to spina bifida   Social Determinants of Radio broadcast assistant Strain: Not on file  Food Insecurity: Not on file  Transportation Needs: Not on file  Physical Activity: Not on file  Stress: Not on file  Social Connections: Not on file     Family History: The patient's family history includes Breast cancer in her paternal aunt; Celiac disease in her sister; Diabetes in her sister; Heart disease in her sister; Hyperlipidemia in her daughter, mother, sister, and another family member; Irritable bowel syndrome in her sister; Spina bifida in her son. There is no history of Cancer.  History of coronary artery disease for no members History of heart failure notable for no members, sister has valve replacement. History of arrhythmia notable for no members.  ROS:   Please see the history of present illness.    All other systems reviewed and are negative.  EKGs/Labs/Other Studies Reviewed:    The following studies were reviewed today:  EKG:  12/25/19: SR 78 no TWI, no q waves  Recent Labs: 08/09/2021: TSH 1.43 10/18/2021: ALT 62; BUN 9; Creatinine, Ser 0.56; Hemoglobin 13.2; Platelets 338; Potassium 3.9; Sodium 138  Recent Lipid Panel    Component Value Date/Time   CHOL 138 12/21/2020 0756   CHOL 355 (H) 06/26/2017 1108   TRIG 109.0 12/21/2020 0756   HDL 53.10 12/21/2020 0756   HDL 59 06/26/2017 1108   CHOLHDL 3 12/21/2020 0756   VLDL 21.8 12/21/2020 0756   LDLCALC 63 12/21/2020 0756   LDLCALC 279 (H) 06/26/2017 1108    Transthoracic Echocardiogram: Date: 01/15/20 Results: Normal Biventricular Function IMPRESSIONS   1. Left ventricular ejection fraction, by estimation, is 60 to 65%. The  left ventricle has normal function. The left ventricle has no regional  wall motion abnormalities. Left ventricular diastolic parameters were  normal.   2. Right ventricular systolic function is normal. The right  ventricular  size is normal.   3. The mitral valve is normal in structure. Trivial mitral valve  regurgitation. No evidence of mitral stenosis.   4. The aortic valve is tricuspid. Aortic valve regurgitation is not  visualized. Mild aortic valve sclerosis is present, with no evidence of  aortic valve stenosis.   5. The inferior vena cava is normal in size with greater than 50%  respiratory variability, suggesting right atrial pressure of 3 mmHg.   ECG Stress Testing: Date: 2017 Results:Negative ECG Stress test in 2017.  Physical Exam:    VS:  BP 110/75   Pulse 83   Ht $R'5\' 6"'SB$  (1.676 m)   Wt 168 lb (76.2 kg)   SpO2 98%   BMI 27.12 kg/m  Orthostatic Vitals: Supine:  BP 98/70  HR 75 Sitting:  BP 98/70  HR 77 Standing:  BP 103/68  HR 97 Prolonged Stand:  BP 96/70  HR 96  Wt Readings from Last 3 Encounters:  01/08/22 168 lb (76.2 kg)  01/01/22 169 lb 4 oz (76.8 kg)  12/28/21 168 lb 3.2 oz (76.3 kg)    GEN:  Well nourished, well developed in no acute distress HEENT: Normal NECK: No JVD LYMPHATICS: No lymphadenopathy CARDIAC: RRR, no murmurs, rubs, gallops RESPIRATORY:  Clear to auscultation without rales, wheezing or rhonchi  ABDOMEN: Soft, non-tender, non-distended MUSCULOSKELETAL:  No edema; SKIN: Warm and dry NEUROLOGIC:  Alert and oriented x 3 PSYCHIATRIC:  Normal affect   ASSESSMENT:    1. Orthostatic hypotension   2. Hyperlipidemia associated with type 2 diabetes mellitus ([place])   3. Familial hypercholesterolemia     PLAN:    Orthostatic Hypotension - has midodrine 2.5 mg PRN for her orthostatic hypotension, her last spells were mediated by cholecystitis, she has since had surgery and done well. - if needing if TID and without symptoms we will restart abdominal binder and compression stockings and bring her back sooner  Hyperlipidemia (famililal) with DM Familial Cholesterolemia Statin myalgia without evidence of rhabdomyolysis - controlled on statin,  zetia and PCKSK9i; at LDL goal < 70 (was 249 prior) - at next visit may stop zetia based on fasting labs   One year follow up unless new symptoms or abnormal test results warranting change in plan   Medication Adjustments/Labs and Tests Ordered: Current medicines are reviewed at length with the patient today.  Concerns regarding medicines are outlined above.  No orders of the defined types were placed in this encounter.   No orders of the defined types were placed in this encounter.    Patient Instructions  Medication Instructions:  Your physician recommends that you continue on your current medications as directed. Please refer to the Current Medication list given to you today.  *If you need a refill on your cardiac medications before your next appointment, please call your pharmacy*   Lab Work: NONE If you have labs (blood work) drawn today and your tests are completely normal, you will receive your results only by: [place] (if you have MyChart) OR A paper copy in the mail If you have any lab test that is abnormal or we need to change your treatment, we will call you to review the results.   Testing/Procedures: NONE   Follow-Up: At [facility], you and your health needs are our priority.  As part of our continuing mission to provide you with exceptional heart care, we have created designated Provider Care Teams.  These Care Teams include your primary Cardiologist (physician) and Advanced Practice Providers (APPs -  Physician Assistants and Nurse Practitioners) who all work together to provide you with the care you need, when you need it.    Your next appointment:   1 year(s)  The format for your next appointment:   In Person  Provider:   Werner Lean, MD      Important Information About Sugar         Signed, Werner Lean, MD  01/08/2022 9:15 AM    [place]

## 2022-01-17 ENCOUNTER — Other Ambulatory Visit: Payer: Self-pay | Admitting: Family Medicine

## 2022-01-17 ENCOUNTER — Other Ambulatory Visit (HOSPITAL_BASED_OUTPATIENT_CLINIC_OR_DEPARTMENT_OTHER): Payer: Self-pay

## 2022-01-17 MED ORDER — ONDANSETRON HCL 4 MG PO TABS
4.0000 mg | ORAL_TABLET | Freq: Three times a day (TID) | ORAL | 1 refills | Status: DC | PRN
  Filled 2022-01-17: qty 20, 7d supply, fill #0

## 2022-01-19 ENCOUNTER — Encounter: Payer: Self-pay | Admitting: Family Medicine

## 2022-01-19 ENCOUNTER — Ambulatory Visit (INDEPENDENT_AMBULATORY_CARE_PROVIDER_SITE_OTHER): Payer: No Typology Code available for payment source | Admitting: Family Medicine

## 2022-01-19 VITALS — BP 100/68 | HR 82 | Temp 98.6°F | Ht 66.0 in | Wt 168.0 lb

## 2022-01-19 DIAGNOSIS — M7551 Bursitis of right shoulder: Secondary | ICD-10-CM

## 2022-01-19 NOTE — Progress Notes (Signed)
Subjective  CC:  Chief Complaint  Patient presents with   Shoulder Pain    Rt shoulder x2day  No hx injury     HPI: Rachel [name] is a 46 y.o. female who presents to the office today to address the problems listed above in the chief complaint. 46 year old complains of 2-day history of right shoulder pain.  She reports that she slept hard 2 nights ago on her right side.  Midday the following day had onset of aching pain especially with lifting the right arm.  No neck pain or radicular symptoms.  No weakness.  No injury.  No history of shoulder problems.  Has not taken any medications.  He has GERD and history of PUD and cannot tolerate NSAIDs well at this time.  She is on a PPI.  Assessment  1. Subdeltoid bursitis of right shoulder joint      Plan  Subdeltoid bursitis: Likely due to mechanical trauma.  Recommend ice 3 times daily, Tylenol Extra Strength twice daily and rest with sling for the next 3 days.  She will follow-up with me that if not improving.  Avoid NSAIDs for now.  Follow up: As needed Visit date not found  No orders of the defined types were placed in this encounter.  No orders of the defined types were placed in this encounter.     I reviewed the patients updated PMH, FH, and SocHx.    Patient Active Problem List   Diagnosis Date Noted   Controlled type 2 diabetes mellitus without complication, without long-term current use of insulin ([place]) 08/10/2020    Priority: High   Hyperlipidemia associated with type 2 diabetes mellitus ([place]) 03/29/2020    Priority: High   Familial hyperlipidemia 08/17/2015    Priority: High   GERD (gastroesophageal reflux disease) 08/10/2020    Priority: Medium    History of Helicobacter pylori infection 08/10/2020    Priority: Medium    History of peptic ulcer 08/10/2020    Priority: Medium    Adjustment reaction with anxiety 06/26/2017    Priority: Medium    Chronic migraine 12/28/2016    Priority: Medium    Insomnia  11/01/2015    Priority: Medium    Orthostatic hypotension 08/17/2015    Priority: Medium    Familial hypercholesterolemia 01/08/2022   Screening for colon cancer 01/02/2022   Chronic constipation 01/02/2022   TMJ (dislocation of temporomandibular joint) 01/10/2021   Menorrhagia with regular cycle 12/07/2020   Migraine without aura, not intractable, without status migrainosus 12/07/2020   Myalgia due to statin 09/02/2020   Current Meds  Medication Sig   Evolocumab (REPATHA SURECLICK) 841 MG/ML SOAJ Inject 1 pen into the skin every 14 (fourteen) days.   ezetimibe (ZETIA) 10 MG tablet Take 1 tablet (10 mg total) by mouth daily.   fluticasone (FLONASE) 50 MCG/ACT nasal spray Place 1 spray into both nostrils 2 (two) times daily. (Patient taking differently: Place 1 spray into both nostrils as needed for allergies.)   glucose blood (FREESTYLE LITE) test strip Use once daily to check blood sugar R73.03   glucose monitoring kit (FREESTYLE) monitoring kit 1 each by Does not apply route daily.   ibuprofen (ADVIL) 200 MG tablet Take 200 mg by mouth every 6 (six) hours as needed.   Lancets MISC Use to check blood sugars daily. Please provide brand covered by insurance Dx code: R73.03   meclizine (ANTIVERT) 25 MG tablet Take 1 tablet (25 mg total) by mouth 3 (three) times  daily as needed for dizziness.   midodrine (PROAMATINE) 2.5 MG tablet Take 1 tablet (2.5 mg total) by mouth 3 (three) times daily with meals. (Patient taking differently: Take 2.5 mg by mouth 3 (three) times daily as needed.)   Multiple Vitamin (MULTIVITAMIN WITH MINERALS) TABS tablet Take 1 tablet by mouth daily.   ondansetron (ZOFRAN) 4 MG tablet Take 1 tablet (4 mg total) by mouth every 8 (eight) hours as needed for nausea or vomiting.   pantoprazole (PROTONIX) 20 MG tablet Take 1 tablet (20 mg total) by mouth 2 (two) times daily.   rizatriptan (MAXALT-MLT) 10 MG disintegrating tablet Take 1 tablet (10 mg total) by mouth as needed  for migraine. May repeat in 2 hours if needed   rosuvastatin (CRESTOR) 5 MG tablet Take 1 tablet (5 mg total) by mouth daily.    Allergies: Patient has No Known Allergies. Family History: Patient family history includes Breast cancer in her paternal aunt; Celiac disease in her sister; Diabetes in her sister; Heart disease in her sister; Hyperlipidemia in her daughter, mother, sister, and another family member; Irritable bowel syndrome in her sister; Spina bifida in her son. Social History:  Patient  reports that she has never smoked. She has never used smokeless tobacco. She reports that she does not drink alcohol and does not use drugs.  Review of Systems: Constitutional: Negative for fever malaise or anorexia Cardiovascular: negative for chest pain Respiratory: negative for SOB or persistent cough Gastrointestinal: negative for abdominal pain  Objective  Vitals: BP 100/68   Pulse 82   Temp 98.6 F (37 C) (Temporal)   Ht _0  (1.676 m)   Wt 168 lb (76.2 kg)   SpO2 98%   BMI 27.12 kg/m  General: no acute distress , A&Ox3 Supple neck Right shoulder: Tender over subdeltoid bursa over lateral shoulder, no other tender areas or joints.  Full range of motion but pain with abduction.  Positive empty can test.   Commons side effects, risks, benefits, and alternatives for medications and treatment plan prescribed today were discussed, and the patient expressed understanding of the given instructions. Patient is instructed to call or message via MyChart if he/she has any questions or concerns regarding our treatment plan. No barriers to understanding were identified. We discussed Red Flag symptoms and signs in detail. Patient expressed understanding regarding what to do in case of urgent or emergency type symptoms.  Medication list was reconciled, printed and provided to the patient in AVS. Patient instructions and summary information was reviewed with the patient as documented in the  AVS. This note was prepared with assistance of Dragon voice recognition software. Occasional wrong-word or sound-a-like substitutions may have occurred due to the inherent limitations of voice recognition software  This visit occurred during the SARS-CoV-2 public health emergency.  Safety protocols were in place, including screening questions prior to the visit, additional usage of staff PPE, and extensive cleaning of exam room while observing appropriate contact time as indicated for disinfecting solutions.

## 2022-02-01 ENCOUNTER — Other Ambulatory Visit (HOSPITAL_BASED_OUTPATIENT_CLINIC_OR_DEPARTMENT_OTHER): Payer: Self-pay

## 2022-02-01 ENCOUNTER — Other Ambulatory Visit: Payer: Self-pay

## 2022-02-01 DIAGNOSIS — Z309 Encounter for contraceptive management, unspecified: Secondary | ICD-10-CM

## 2022-02-01 MED ORDER — MEDROXYPROGESTERONE ACETATE 10 MG PO TABS
10.0000 mg | ORAL_TABLET | Freq: Every day | ORAL | 0 refills | Status: DC
  Filled 2022-02-01: qty 10, 10d supply, fill #0

## 2022-02-13 ENCOUNTER — Other Ambulatory Visit (HOSPITAL_BASED_OUTPATIENT_CLINIC_OR_DEPARTMENT_OTHER): Payer: Self-pay

## 2022-02-13 MED ORDER — DIAZEPAM 5 MG PO TABS
5.0000 mg | ORAL_TABLET | ORAL | 0 refills | Status: DC
  Filled 2022-02-13: qty 6, 6d supply, fill #0

## 2022-02-14 ENCOUNTER — Other Ambulatory Visit (HOSPITAL_BASED_OUTPATIENT_CLINIC_OR_DEPARTMENT_OTHER): Payer: Self-pay

## 2022-02-16 ENCOUNTER — Other Ambulatory Visit (HOSPITAL_BASED_OUTPATIENT_CLINIC_OR_DEPARTMENT_OTHER): Payer: Self-pay

## 2022-02-23 ENCOUNTER — Encounter: Payer: Self-pay | Admitting: Family Medicine

## 2022-02-23 ENCOUNTER — Ambulatory Visit (INDEPENDENT_AMBULATORY_CARE_PROVIDER_SITE_OTHER): Payer: No Typology Code available for payment source | Admitting: Family Medicine

## 2022-02-23 VITALS — BP 118/78 | HR 80 | Temp 97.8°F | Ht 66.0 in | Wt 167.6 lb

## 2022-02-23 DIAGNOSIS — E119 Type 2 diabetes mellitus without complications: Secondary | ICD-10-CM

## 2022-02-23 DIAGNOSIS — E785 Hyperlipidemia, unspecified: Secondary | ICD-10-CM

## 2022-02-23 DIAGNOSIS — E1169 Type 2 diabetes mellitus with other specified complication: Secondary | ICD-10-CM

## 2022-02-23 DIAGNOSIS — I951 Orthostatic hypotension: Secondary | ICD-10-CM

## 2022-02-23 DIAGNOSIS — K219 Gastro-esophageal reflux disease without esophagitis: Secondary | ICD-10-CM

## 2022-02-23 DIAGNOSIS — E7849 Other hyperlipidemia: Secondary | ICD-10-CM | POA: Diagnosis not present

## 2022-02-23 DIAGNOSIS — N920 Excessive and frequent menstruation with regular cycle: Secondary | ICD-10-CM

## 2022-02-23 DIAGNOSIS — K5909 Other constipation: Secondary | ICD-10-CM

## 2022-02-23 LAB — POCT GLYCOSYLATED HEMOGLOBIN (HGB A1C): Hemoglobin A1C: 5.4 % (ref 4.0–5.6)

## 2022-02-23 LAB — MICROALBUMIN / CREATININE URINE RATIO
Creatinine,U: 90.8 mg/dL
Microalb Creat Ratio: 1.1 mg/g (ref 0.0–30.0)
Microalb, Ur: 1 mg/dL (ref 0.0–1.9)

## 2022-02-23 NOTE — Progress Notes (Signed)
Subjective  CC:  Chief Complaint  Patient presents with   Diabetes    HPI: Rachel [name] is a 46 y.o. female who presents to the office today for follow up of diabetes and problems listed above in the chief complaint.  Diabetes follow up:no longer on meds; diet is 100% better. Eating veggies, fruits, oats; gluten free, lactose free. Weight is down from a high of 182 in 11/2020.  Her diabetic control is reported as Improved.  She denies exertional CP or SOB or symptomatic hypoglycemia. She denies foot sores or paresthesias.  Orthostatic hypotension, idopathic; stable now that gallbladder is out (chronic cholecystitis worsened low bps). Had recent cardiology visit. I reviewed office visit and echocardiograms. No longer needing midrine. HLD on repatha, crestor and zetia. Due fasting lipids. May be able to stop zetia if numbers are at goal. Tolerating meds.  Abdominal pain and GERD on protonix; no more pain or constipation. Hasn't tried coming off PPI yet.  Menorrhagia: skipped a cycle but had a heavy cycle prior. Reviewed our prior office visits starting back in 2022; now with occ missed cycle. No pain. Defers further eval at this time. H/o IDA resolved with oral iron. No longer on iron. Eats a healthy iron rich diet.  Constipation has resolved with new diet.  Updated problem list and care teams and HM sections Had tdap in 2015 Has cologuard at home  Wt Readings from Last 3 Encounters:  02/23/22 167 lb 9.6 oz (76 kg)  01/19/22 168 lb (76.2 kg)  01/08/22 168 lb (76.2 kg)    BP Readings from Last 3 Encounters:  02/23/22 118/78  01/19/22 100/68  01/08/22 110/75    Assessment  1. Controlled type 2 diabetes mellitus without complication, without long-term current use of insulin ([facility])   2. Familial hyperlipidemia   3. Hyperlipidemia associated with type 2 diabetes mellitus ([facility])   4. Orthostatic hypotension   5. Gastroesophageal reflux disease, unspecified whether esophagitis present    6. Menorrhagia with regular cycle   7. Chronic constipation      Plan  Diabetes is currently very well controlled. Now in normal range. Praised. She is thrilled. Continue with weight loss and healthy diet. Will recheck in 6 mnths. May have resolved. Screen urine nephropathy today. Not on ace. Normo to low blood pressures.  HLD: will recheck fasting levels and adjust meds GERD try to wean off ppi. Check folate and b12 Menorrhagia; perimenopausal. Pt declines ultrasound. Monitor and check cbc iron levels.  Constipation has resolved.  No more stress/adjustment anxiety. Did not tolerate zoloft.  HM: pt to complete cologuard.  I spent a total of 52 minutes for this patient encounter. Time spent included preparation, face-to-face counseling with the patient and coordination of care, review of chart and records, and documentation of the encounter.   Follow up: 6 mo for cpe. Orders Placed This Encounter  Procedures   Microalbumin / creatinine urine ratio   Hemoglobin A1c   B12 and Folate Panel   CBC with Differential/Platelet   Iron, TIBC and Ferritin Panel   Comprehensive metabolic panel   Lipid panel   TSH   VITAMIN D 25 Hydroxy (Vit-D Deficiency, Fractures)   POCT HgB A1C   No orders of the defined types were placed in this encounter.     Immunization History  Administered Date(s) Administered   Influenza,inj,Quad PF,6+ Mos 12/27/2019, 12/21/2020, 12/16/2021   Influenza-Unspecified 12/13/2015, 12/06/2017, 12/09/2018   PFIZER(Purple Top)SARS-COV-2 Vaccination 08/24/2019, 09/12/2019   Tdap 01/17/2014  Diabetes Related Lab Review: Lab Results  Component Value Date   HGBA1C 5.4 02/23/2022   HGBA1C 5.2 08/23/2021   HGBA1C 6.0 (A) 05/16/2021    Lab Results  Component Value Date   MICROALBUR 0.8 08/10/2020   Lab Results  Component Value Date   CREATININE 0.56 10/18/2021   BUN 9 10/18/2021   NA 138 10/18/2021   K 3.9 10/18/2021   CL 97 (L) 10/18/2021   CO2 27  10/18/2021   Lab Results  Component Value Date   CHOL 138 12/21/2020   CHOL 184 08/10/2020   CHOL 249 (H) 03/21/2020   Lab Results  Component Value Date   HDL 53.10 12/21/2020   HDL 44.70 08/10/2020   HDL 47.50 03/21/2020   Lab Results  Component Value Date   LDLCALC 63 12/21/2020   LDLCALC 111 (H) 08/10/2020   LDLCALC 164 (H) 03/21/2020   Lab Results  Component Value Date   TRIG 109.0 12/21/2020   TRIG 142.0 08/10/2020   TRIG 184.0 (H) 03/21/2020   Lab Results  Component Value Date   CHOLHDL 3 12/21/2020   CHOLHDL 4 08/10/2020   CHOLHDL 5 03/21/2020   No results found for: "LDLDIRECT" The 10-year ASCVD risk score (Arnett DK, et al., 2019) is: 0.8%   Values used to calculate the score:     Age: 61 years     Sex: Female     Is Non-Hispanic African American: No     Diabetic: Yes     Tobacco smoker: No     Systolic Blood Pressure: 118 mmHg     Is BP treated: No     HDL Cholesterol: 53.1 mg/dL     Total Cholesterol: 138 mg/dL I have reviewed the PMH, Fam and Soc history. Patient Active Problem List   Diagnosis Date Noted   Myalgia due to statin 09/02/2020    Priority: High   Prediabetes 12/12/2016    Priority: High   Familial hyperlipidemia 08/17/2015    Priority: High    Repatha; statin intolerant    Menorrhagia with regular cycle 12/07/2020    Priority: Medium     11/2020 - perimenopausal w/ occ missed cycles and at times heavy bleeding with IDA, improved with iron therapy. 2023: pt deferred TVUS or further eval. Treated with provera 10x10d. HX: had regular cycles at times heavy. Had menorrhagia in 2017; reviewed notes and transvag US. No fibroids and normal endometrium at that time. Had single ovarian cyst. Pt is overdue for pap smear. She reports adverse mood effects from OCPs x 2 in past    Migraine without aura, not intractable, without status migrainosus 12/07/2020    Priority: Medium    GERD (gastroesophageal reflux disease) 08/10/2020    Priority:  Medium    History of Helicobacter pylori infection 08/10/2020    Priority: Medium    History of peptic ulcer 08/10/2020    Priority: Medium    Adjustment reaction with anxiety 06/26/2017    Priority: Medium    Chronic migraine 12/28/2016    Priority: Medium     Referred to neurology. Did not tolerate topiramate.    Insomnia 11/01/2015    Priority: Medium    Orthostatic hypotension 08/17/2015    Priority: Medium    Chronic constipation 01/02/2022    Resolved with gluten free and lactose free diet 2023    TMJ (dislocation of temporomandibular joint) 01/10/2021    Social History: Patient  reports that she has never smoked. She has never used smokeless tobacco.  She reports that she does not drink alcohol and does not use drugs.  Review of Systems: Ophthalmic: negative for eye pain, loss of vision or double vision Cardiovascular: negative for chest pain Respiratory: negative for SOB or persistent cough Gastrointestinal: negative for abdominal pain Genitourinary: negative for dysuria or gross hematuria MSK: negative for foot lesions Neurologic: negative for weakness or gait disturbance  Objective  Vitals: BP 118/78   Pulse 80   Temp 97.8 F (36.6 C)   Ht 5\' 6"  (1.676 m)   Wt 167 lb 9.6 oz (76 kg)   SpO2 97%   BMI 27.05 kg/m  General: well appearing, no acute distress  Psych:  Alert and oriented, normal mood and affect     Diabetic education: ongoing education regarding chronic disease management for diabetes was given today. We continue to reinforce the ABC's of diabetic management: A1c (<7 or 8 dependent upon patient), tight blood pressure control, and cholesterol management with goal LDL < 100 minimally. We discuss diet strategies, exercise recommendations, medication options and possible side effects. At each visit, we review recommended immunizations and preventive care recommendations for diabetics and stress that good diabetic control can prevent other problems. See  below for this patient's data.   Commons side effects, risks, benefits, and alternatives for medications and treatment plan prescribed today were discussed, and the patient expressed understanding of the given instructions. Patient is instructed to call or message via MyChart if he/she has any questions or concerns regarding our treatment plan. No barriers to understanding were identified. We discussed Red Flag symptoms and signs in detail. Patient expressed understanding regarding what to do in case of urgent or emergency type symptoms.  Medication list was reconciled, printed and provided to the patient in AVS. Patient instructions and summary information was reviewed with the patient as documented in the AVS. This note was prepared with assistance of Dragon voice recognition software. Occasional wrong-word or sound-a-like substitutions may have occurred due to the inherent limitations of voice recognition software

## 2022-02-27 ENCOUNTER — Other Ambulatory Visit (HOSPITAL_BASED_OUTPATIENT_CLINIC_OR_DEPARTMENT_OTHER): Payer: Self-pay

## 2022-02-28 ENCOUNTER — Other Ambulatory Visit (INDEPENDENT_AMBULATORY_CARE_PROVIDER_SITE_OTHER): Payer: No Typology Code available for payment source

## 2022-02-28 DIAGNOSIS — K219 Gastro-esophageal reflux disease without esophagitis: Secondary | ICD-10-CM

## 2022-02-28 DIAGNOSIS — E785 Hyperlipidemia, unspecified: Secondary | ICD-10-CM

## 2022-02-28 DIAGNOSIS — I951 Orthostatic hypotension: Secondary | ICD-10-CM | POA: Diagnosis not present

## 2022-02-28 DIAGNOSIS — E1169 Type 2 diabetes mellitus with other specified complication: Secondary | ICD-10-CM | POA: Diagnosis not present

## 2022-02-28 DIAGNOSIS — N920 Excessive and frequent menstruation with regular cycle: Secondary | ICD-10-CM | POA: Diagnosis not present

## 2022-02-28 DIAGNOSIS — E119 Type 2 diabetes mellitus without complications: Secondary | ICD-10-CM | POA: Diagnosis not present

## 2022-02-28 DIAGNOSIS — E7849 Other hyperlipidemia: Secondary | ICD-10-CM

## 2022-02-28 LAB — CBC WITH DIFFERENTIAL/PLATELET
Basophils Absolute: 0 10*3/uL (ref 0.0–0.1)
Basophils Relative: 0.8 % (ref 0.0–3.0)
Eosinophils Absolute: 0.2 10*3/uL (ref 0.0–0.7)
Eosinophils Relative: 4.2 % (ref 0.0–5.0)
HCT: 37.6 % (ref 36.0–46.0)
Hemoglobin: 12.7 g/dL (ref 12.0–15.0)
Lymphocytes Relative: 51.5 % — ABNORMAL HIGH (ref 12.0–46.0)
Lymphs Abs: 2.3 10*3/uL (ref 0.7–4.0)
MCHC: 33.8 g/dL (ref 30.0–36.0)
MCV: 81.4 fl (ref 78.0–100.0)
Monocytes Absolute: 0.3 10*3/uL (ref 0.1–1.0)
Monocytes Relative: 7.5 % (ref 3.0–12.0)
Neutro Abs: 1.6 10*3/uL (ref 1.4–7.7)
Neutrophils Relative %: 36 % — ABNORMAL LOW (ref 43.0–77.0)
Platelets: 301 10*3/uL (ref 150.0–400.0)
RBC: 4.62 Mil/uL (ref 3.87–5.11)
RDW: 14.7 % (ref 11.5–15.5)
WBC: 4.4 10*3/uL (ref 4.0–10.5)

## 2022-02-28 LAB — LIPID PANEL
Cholesterol: 251 mg/dL — ABNORMAL HIGH (ref 0–200)
HDL: 54.3 mg/dL (ref >39.00)
LDL Cholesterol: 172 mg/dL — ABNORMAL HIGH (ref 0–99)
NonHDL: 196.85
Total CHOL/HDL Ratio: 5
Triglycerides: 122 mg/dL (ref 0.0–149.0)
VLDL: 24.4 mg/dL (ref 0.0–40.0)

## 2022-02-28 LAB — COMPREHENSIVE METABOLIC PANEL
ALT: 7 U/L (ref 0–35)
AST: 11 U/L (ref 0–37)
Albumin: 4.2 g/dL (ref 3.5–5.2)
Alkaline Phosphatase: 60 U/L (ref 39–117)
BUN: 8 mg/dL (ref 6–23)
CO2: 26 mEq/L (ref 19–32)
Calcium: 8.9 mg/dL (ref 8.4–10.5)
Chloride: 106 mEq/L (ref 96–112)
Creatinine, Ser: 0.46 mg/dL (ref 0.40–1.20)
GFR: 115.45 mL/min (ref >60.00)
Glucose, Bld: 87 mg/dL (ref 70–99)
Potassium: 3.7 mEq/L (ref 3.5–5.1)
Sodium: 139 mEq/L (ref 135–145)
Total Bilirubin: 0.7 mg/dL (ref 0.2–1.2)
Total Protein: 7 g/dL (ref 6.0–8.3)

## 2022-02-28 LAB — B12 AND FOLATE PANEL
Folate: 21.2 ng/mL (ref >5.9)
Vitamin B-12: 435 pg/mL (ref 211–911)

## 2022-02-28 LAB — VITAMIN D 25 HYDROXY (VIT D DEFICIENCY, FRACTURES): VITD: 39.72 ng/mL (ref 30.00–100.00)

## 2022-02-28 LAB — TSH: TSH: 0.69 u[IU]/mL (ref 0.35–5.50)

## 2022-02-28 LAB — HEMOGLOBIN A1C: Hgb A1c MFr Bld: 5.7 % (ref 4.6–6.5)

## 2022-03-01 LAB — IRON,TIBC AND FERRITIN PANEL
%SAT: 10 % (calc) — ABNORMAL LOW (ref 16–45)
Ferritin: 5 ng/mL — ABNORMAL LOW (ref 16–232)
Iron: 41 ug/dL (ref 40–190)
TIBC: 406 mcg/dL (calc) (ref 250–450)

## 2022-03-06 ENCOUNTER — Other Ambulatory Visit: Payer: Self-pay

## 2022-03-06 DIAGNOSIS — R790 Abnormal level of blood mineral: Secondary | ICD-10-CM

## 2022-03-08 ENCOUNTER — Telehealth: Payer: Self-pay | Admitting: Oncology

## 2022-03-08 NOTE — Telephone Encounter (Signed)
Attempted to contact patient in regards to scheduling initial visit from referral but no answer so voicemail was left for patient to call back

## 2022-03-09 ENCOUNTER — Encounter: Payer: Self-pay | Admitting: *Deleted

## 2022-03-09 NOTE — Progress Notes (Signed)
Call from [place] [facility]/[place] at [facility] to f/u on status of referral. Was informed that scheduler attempted to reach patient on 12/21 without success. They will reach out to her again on 12/26

## 2022-03-18 ENCOUNTER — Other Ambulatory Visit (HOSPITAL_BASED_OUTPATIENT_CLINIC_OR_DEPARTMENT_OTHER): Payer: Self-pay

## 2022-03-20 ENCOUNTER — Other Ambulatory Visit (HOSPITAL_BASED_OUTPATIENT_CLINIC_OR_DEPARTMENT_OTHER): Payer: Self-pay

## 2022-03-27 ENCOUNTER — Encounter: Payer: Self-pay | Admitting: Family Medicine

## 2022-03-27 ENCOUNTER — Ambulatory Visit (INDEPENDENT_AMBULATORY_CARE_PROVIDER_SITE_OTHER): Payer: 59 | Admitting: Family Medicine

## 2022-03-27 VITALS — Ht 66.0 in | Wt 167.0 lb

## 2022-03-27 DIAGNOSIS — U071 COVID-19: Secondary | ICD-10-CM

## 2022-03-27 NOTE — Progress Notes (Signed)
Virtual Visit via Video Note  Subjective  CC:  Chief Complaint  Patient presents with   Covid Positive    Pt stated that she tested positive on 03/25/2021. Still having nasal congestion ear pain and scratchy throat.     I connected with Marigene Clift on 03/27/22 at 11:15 AM EST by a video enabled telemedicine application and verified that I am speaking with the correct person using two identifiers. Location patient: Home Location provider: [facility] [address], Office Persons participating in the virtual visit: Vaniyah Marina, [place], MD Trudie Reed CMA  I discussed the limitations of evaluation and management by telemedicine and the availability of in person appointments. The patient expressed understanding and agreed to proceed. HPI: Rachel [name] is a 47 y.o. female who was contacted today to address the problems listed above in the chief complaint. Covid +, likely day 5 or 6, improving overall: last fever 102 3 days ago. But still with burning raw throat, cough and congestion. No sob or cp. No sig gi sxs but decreased appetite.  Assessment  1. COVID      Plan  covid:  trial of aspirin gargles and low dose nsaids for ST. Add mucinex dm for cough/congestion. Rest, hydrate and time. Improving.  I discussed the assessment and treatment plan with the patient. The patient was provided an opportunity to ask questions and all were answered. The patient agreed with the plan and demonstrated an understanding of the instructions.   The patient was advised to call back or seek an in-person evaluation if the symptoms worsen or if the condition fails to improve as anticipated. Follow up: prn  Visit date not found  No orders of the defined types were placed in this encounter.     I reviewed the patients updated PMH, FH, and SocHx.    Patient Active Problem List   Diagnosis Date Noted   Myalgia due to statin 09/02/2020    Priority: High   Prediabetes  12/12/2016    Priority: High   Familial hyperlipidemia 08/17/2015    Priority: High   Menorrhagia with regular cycle 12/07/2020    Priority: Medium    Migraine without aura, not intractable, without status migrainosus 12/07/2020    Priority: Medium    GERD (gastroesophageal reflux disease) 08/10/2020    Priority: Medium    History of Helicobacter pylori infection 08/10/2020    Priority: Medium    History of peptic ulcer 08/10/2020    Priority: Medium    Adjustment reaction with anxiety 06/26/2017    Priority: Medium    Chronic migraine 12/28/2016    Priority: Medium    Insomnia 11/01/2015    Priority: Medium    Orthostatic hypotension 08/17/2015    Priority: Medium    Chronic constipation 01/02/2022   TMJ (dislocation of temporomandibular joint) 01/10/2021   Current Meds  Medication Sig   diazepam (VALIUM) 5 MG tablet Take 1 tablet (5 mg total) by mouth 1 hour prior to procedure   Evolocumab (REPATHA SURECLICK) 140 MG/ML SOAJ Inject 1 pen into the skin every 14 (fourteen) days.   ezetimibe (ZETIA) 10 MG tablet Take 1 tablet (10 mg total) by mouth daily.   fluticasone (FLONASE) 50 MCG/ACT nasal spray Place 1 spray into both nostrils 2 (two) times daily. (Patient taking differently: Place 1 spray into both nostrils as needed for allergies.)   glucose blood (FREESTYLE LITE) test strip Use once daily to check blood sugar R73.03  glucose monitoring kit (FREESTYLE) monitoring kit 1 each by Does not apply route daily.   Lancets MISC Use to check blood sugars daily. Please provide brand covered by insurance Dx code: R73.03   meclizine (ANTIVERT) 25 MG tablet Take 1 tablet (25 mg total) by mouth 3 (three) times daily as needed for dizziness.   Multiple Vitamin (MULTIVITAMIN WITH MINERALS) TABS tablet Take 1 tablet by mouth daily.   rizatriptan (MAXALT-MLT) 10 MG disintegrating tablet Take 1 tablet (10 mg total) by mouth as needed for migraine. May repeat in 2 hours if needed    rosuvastatin (CRESTOR) 5 MG tablet Take 1 tablet (5 mg total) by mouth daily.    Allergies: Patient has No Known Allergies. Family History: Patient family history includes Breast cancer in her paternal aunt; Celiac disease in her sister; Diabetes in her sister; Heart disease in her sister; Hyperlipidemia in her daughter, mother, sister, and another family member; Irritable bowel syndrome in her sister; Spina bifida in her son. Social History:  Patient  reports that she has never smoked. She has never used smokeless tobacco. She reports that she does not drink alcohol and does not use drugs.  Review of Systems: Constitutional: Negative for fever malaise or anorexia Cardiovascular: negative for chest pain Respiratory: negative for SOB or persistent cough Gastrointestinal: negative for abdominal pain  OBJECTIVE Vitals: Ht 5\' 6"  (1.676 m)   Wt 167 lb (75.8 kg)   BMI 26.95 kg/m  General: no acute distress , A&Ox3  Leamon Arnt, MD

## 2022-03-29 ENCOUNTER — Other Ambulatory Visit: Payer: Self-pay | Admitting: *Deleted

## 2022-03-29 DIAGNOSIS — R790 Abnormal level of blood mineral: Secondary | ICD-10-CM

## 2022-04-06 ENCOUNTER — Encounter: Payer: Self-pay | Admitting: Nurse Practitioner

## 2022-04-06 ENCOUNTER — Inpatient Hospital Stay (HOSPITAL_BASED_OUTPATIENT_CLINIC_OR_DEPARTMENT_OTHER): Payer: 59 | Admitting: Nurse Practitioner

## 2022-04-06 ENCOUNTER — Inpatient Hospital Stay: Payer: 59 | Attending: Oncology

## 2022-04-06 ENCOUNTER — Other Ambulatory Visit (HOSPITAL_BASED_OUTPATIENT_CLINIC_OR_DEPARTMENT_OTHER): Payer: Self-pay

## 2022-04-06 VITALS — BP 109/63 | HR 85 | Temp 98.1°F | Resp 18 | Ht 66.0 in | Wt 166.0 lb

## 2022-04-06 DIAGNOSIS — D72820 Lymphocytosis (symptomatic): Secondary | ICD-10-CM | POA: Insufficient documentation

## 2022-04-06 DIAGNOSIS — R790 Abnormal level of blood mineral: Secondary | ICD-10-CM

## 2022-04-06 DIAGNOSIS — N920 Excessive and frequent menstruation with regular cycle: Secondary | ICD-10-CM | POA: Diagnosis not present

## 2022-04-06 DIAGNOSIS — E7800 Pure hypercholesterolemia, unspecified: Secondary | ICD-10-CM | POA: Insufficient documentation

## 2022-04-06 DIAGNOSIS — E611 Iron deficiency: Secondary | ICD-10-CM | POA: Diagnosis present

## 2022-04-06 DIAGNOSIS — Z803 Family history of malignant neoplasm of breast: Secondary | ICD-10-CM | POA: Insufficient documentation

## 2022-04-06 DIAGNOSIS — G43909 Migraine, unspecified, not intractable, without status migrainosus: Secondary | ICD-10-CM | POA: Insufficient documentation

## 2022-04-06 LAB — CBC WITH DIFFERENTIAL (CANCER CENTER ONLY)
Abs Immature Granulocytes: 0 10*3/uL (ref 0.00–0.07)
Basophils Absolute: 0 10*3/uL (ref 0.0–0.1)
Basophils Relative: 1 %
Eosinophils Absolute: 0.1 10*3/uL (ref 0.0–0.5)
Eosinophils Relative: 1 %
HCT: 40.1 % (ref 36.0–46.0)
Hemoglobin: 13.2 g/dL (ref 12.0–15.0)
Immature Granulocytes: 0 %
Lymphocytes Relative: 61 %
Lymphs Abs: 2.5 10*3/uL (ref 0.7–4.0)
MCH: 27.3 pg (ref 26.0–34.0)
MCHC: 32.9 g/dL (ref 30.0–36.0)
MCV: 83 fL (ref 80.0–100.0)
Monocytes Absolute: 0.2 10*3/uL (ref 0.1–1.0)
Monocytes Relative: 6 %
Neutro Abs: 1.3 10*3/uL — ABNORMAL LOW (ref 1.7–7.7)
Neutrophils Relative %: 31 %
Platelet Count: 352 10*3/uL (ref 150–400)
RBC: 4.83 MIL/uL (ref 3.87–5.11)
RDW: 13.4 % (ref 11.5–15.5)
WBC Count: 4.1 10*3/uL (ref 4.0–10.5)
nRBC: 0 % (ref 0.0–0.2)

## 2022-04-06 LAB — FERRITIN: Ferritin: 9 ng/mL — ABNORMAL LOW (ref 11–307)

## 2022-04-06 LAB — SAVE SMEAR(SSMR), FOR PROVIDER SLIDE REVIEW

## 2022-04-06 MED ORDER — FERROUS FUMARATE 325 (106 FE) MG PO TABS
1.0000 | ORAL_TABLET | Freq: Every day | ORAL | 3 refills | Status: DC
  Filled 2022-04-06: qty 30, 30d supply, fill #0

## 2022-04-06 NOTE — Progress Notes (Signed)
New Hematology/Oncology Consult   Requesting MD: Dr. Billey Chang  [phone]      Reason for Consult: Low ferritin  HPI: Rachel [name] is a 47 year old woman referred for evaluation of a low ferritin level.  She had labs done on 02/28/2022-hemoglobin 12.7, MCV 81, white count 4.4, platelet count 301,000; iron 41, TIBC 406, percent saturation 10, ferritin 5.  Comparison CBC and iron studies from 08/09/2021-hemoglobin 12.4, MCV 79, iron 72, TIBC 379, percent saturation 19, ferritin 11.  She reports an irregular menstrual cycle for the past few years.  When she does have a cycle it typically last 7 to 10 days, heavy for 5 or 6 of those days.  She denies other bleeding such as hematuria, bloody/black stools.  She has never had a colonoscopy.  She plans to complete a Cologuard test.  She eats a regular gluten-free diet.  She does not crave ice.  Nails are not brittle.  No tongue soreness.  She discontinued oral iron about a month ago at which time she was taking ferrous sulfate 325 mg daily.  She reports chronic constipation which worsened with oral iron.  Since having her gallbladder removed she notes that bowel habits now fluctuate between constipation and diarrhea.     Past Medical History:  Diagnosis Date   Anxiety    Diabetes mellitus without complication ([place])    type 2   Familial hyperlipidemia 08/17/2015   Gallstones    Migraine without aura, not intractable, without status migrainosus 12/07/2020   Migraines    Orthostatic hypotension 08/17/2015   Prediabetes    PUD (peptic ulcer disease)      Past Surgical History:  Procedure Laterality Date   CESAREAN SECTION     CHOLECYSTECTOMY N/A 10/18/2021   Procedure: LAPAROSCOPIC CHOLECYSTECTOMY;  Surgeon: Georganna Skeans, MD;  Location: [facility];  Service: General;  Laterality: N/A;     Current Outpatient Medications:    diazepam (VALIUM) 5 MG tablet, Take 1 tablet (5 mg total) by mouth 1 hour prior to procedure, Disp: 6 tablet, Rfl:  0   Evolocumab (REPATHA SURECLICK) 408 MG/ML SOAJ, Inject 1 pen into the skin every 14 (fourteen) days., Disp: 2 mL, Rfl: 11   ezetimibe (ZETIA) 10 MG tablet, Take 1 tablet (10 mg total) by mouth daily., Disp: 90 tablet, Rfl: 3   fluticasone (FLONASE) 50 MCG/ACT nasal spray, Place 1 spray into both nostrils 2 (two) times daily. (Patient taking differently: Place 1 spray into both nostrils as needed for allergies.), Disp: 16 g, Rfl: 6   glucose blood (FREESTYLE LITE) test strip, Use once daily to check blood sugar R73.03, Disp: 100 each, Rfl: 4   glucose monitoring kit (FREESTYLE) monitoring kit, 1 each by Does not apply route daily., Disp: 1 each, Rfl: 0   Lancets MISC, Use to check blood sugars daily. Please provide brand covered by insurance Dx code: R73.03, Disp: 100 each, Rfl: 3   meclizine (ANTIVERT) 25 MG tablet, Take 1 tablet (25 mg total) by mouth 3 (three) times daily as needed for dizziness., Disp: 30 tablet, Rfl: 0   Multiple Vitamin (MULTIVITAMIN WITH MINERALS) TABS tablet, Take 1 tablet by mouth daily., Disp: , Rfl:    rizatriptan (MAXALT-MLT) 10 MG disintegrating tablet, Take 1 tablet (10 mg total) by mouth as needed for migraine. May repeat in 2 hours if needed, Disp: 10 tablet, Rfl: 2   rosuvastatin (CRESTOR) 5 MG tablet, Take 1 tablet (5 mg total) by mouth daily., Disp: 90 tablet, Rfl: 3:  No Known Allergies:  FH: Her sisters anemic, celiac disease.  No family history of an inherited anemia.  Mother is healthy.  Father deceased COPD.  Paternal aunt died in her 45s with breast cancer.  SOCIAL HISTORY: She lives in [place].  She has 2 children.  Her 41 year old child has spina bifida.  64 year old child is healthy.  She is originally from [place].  She works as a Marine scientist.  No tobacco or alcohol use.  Review of Systems: She has hot flashes.  No associated fever.  She has a good appetite.  No weight loss.  She notes that she tires easily for the past several years.  As noted in the  HPI no bleeding except related to the menstrual cycle.  Physical Exam:  Blood pressure 109/63, pulse 85, temperature 98.1 F (36.7 C), temperature source Oral, resp. rate 18, height 5\' 6"  (1.676 m), weight 166 lb (75.3 kg), SpO2 100 %.  HEENT: No thrush. Lungs: Lungs clear bilaterally. Cardiac: Regular rate and rhythm. Abdomen: Abdomen soft and nontender.  No hepatosplenomegaly. Vascular: No leg edema. Lymph nodes: No palpable cervical, supraclavicular, axillary or inguinal lymph nodes.  LABS:   Recent Labs    04/06/22 1051  WBC 4.1  HGB 13.2  HCT 40.1  PLT 352  Peripheral blood smear-few ovalocytes, microcytes, no nucleated red blood cells, polychromasia not increased; relative increase in mature appearing lymphocytes, no blasts or other young forms seen; platelets appear increased.  No results for input(s): "NA", "K", "CL", "CO2", "GLUCOSE", "BUN", "CREATININE", "CALCIUM" in the last 72 hours.    RADIOLOGY:  No results found.  Assessment and Plan:   Iron deficiency Menorrhagia Family history of breast cancer Hypercholesterolemia Migraines Relative lymphocytosis  Ms. Catalina was referred for iron deficiency in the setting of a normal hemoglobin.  The iron deficiency is likely due to menorrhagia.  She will complete stool cards and submit a urine specimen.  She plans to complete a Cologuard test.    She had constipation with ferrous sulfate.  She will try ferrous fumarate 1 tablet daily.  She will contact the office if she is unable to tolerate this preparation of oral iron.  She has a family history of breast cancer (paternal aunt diagnosed in her 81s).  Referral made for a screening mammogram and genetic counseling.  She will return for lab and follow-up in approximately 2 months.  Patient seen with Dr. Benay Spice.      Ned Card, NP 04/06/2022, 12:07 PM  This was a shared visit with Ned Card.  Rachel [name] interviewed and examined.  I reviewed the  peripheral blood smear she is referred for evaluation of iron deficiency.  She is not anemic.  I suspect the iron deficiency is due to menorrhagia.  She reports constipation with oral iron in the past.  She will begin a trial of ferrous fumarate.  The relative lymphocytosis appears chronic.  The mild neutropenia today is likely a benign finding, the neutrophil count has been borderline low in the past.   She will return a set of stool Hemoccult card and a urine to look for another source of blood loss.  I was present for greater than 50% of today's visit.  I performed medical decision making.  Julieanne Manson, MD

## 2022-04-10 ENCOUNTER — Inpatient Hospital Stay: Payer: 59

## 2022-04-10 ENCOUNTER — Other Ambulatory Visit (HOSPITAL_BASED_OUTPATIENT_CLINIC_OR_DEPARTMENT_OTHER): Payer: Self-pay

## 2022-04-10 ENCOUNTER — Telehealth: Payer: Self-pay

## 2022-04-10 DIAGNOSIS — E611 Iron deficiency: Secondary | ICD-10-CM | POA: Diagnosis not present

## 2022-04-10 DIAGNOSIS — R790 Abnormal level of blood mineral: Secondary | ICD-10-CM

## 2022-04-10 DIAGNOSIS — Z803 Family history of malignant neoplasm of breast: Secondary | ICD-10-CM

## 2022-04-10 LAB — URINALYSIS, COMPLETE (UACMP) WITH MICROSCOPIC
Bacteria, UA: NONE SEEN
Bilirubin Urine: NEGATIVE
Glucose, UA: NEGATIVE mg/dL
Hgb urine dipstick: NEGATIVE
Ketones, ur: NEGATIVE mg/dL
Nitrite: NEGATIVE
Protein, ur: NEGATIVE mg/dL
Specific Gravity, Urine: 1.023 (ref 1.005–1.030)
pH: 6.5 (ref 5.0–8.0)

## 2022-04-10 LAB — OCCULT BLOOD X 1 CARD TO LAB, STOOL
Fecal Occult Bld: NEGATIVE
Fecal Occult Bld: NEGATIVE
Fecal Occult Bld: NEGATIVE

## 2022-04-10 NOTE — Telephone Encounter (Signed)
-----  Message from Owens Shark, NP sent at 04/10/2022  1:03 PM EST ----- Please let her know stool cards and urinalysis were negative for blood.  She did have increased white cells in her urine and should follow-up with her PCP.

## 2022-04-10 NOTE — Telephone Encounter (Signed)
Patient gave verbal understanding and had no further questions or concerns  

## 2022-04-16 ENCOUNTER — Other Ambulatory Visit (HOSPITAL_BASED_OUTPATIENT_CLINIC_OR_DEPARTMENT_OTHER): Payer: Self-pay

## 2022-04-16 ENCOUNTER — Other Ambulatory Visit: Payer: Self-pay

## 2022-04-16 MED ORDER — LINACLOTIDE 72 MCG PO CAPS
72.0000 ug | ORAL_CAPSULE | Freq: Every day | ORAL | 6 refills | Status: DC
  Filled 2022-04-16: qty 30, 30d supply, fill #0

## 2022-04-20 ENCOUNTER — Other Ambulatory Visit (HOSPITAL_BASED_OUTPATIENT_CLINIC_OR_DEPARTMENT_OTHER): Payer: Self-pay

## 2022-04-20 ENCOUNTER — Other Ambulatory Visit: Payer: Self-pay

## 2022-05-11 ENCOUNTER — Other Ambulatory Visit: Payer: Self-pay | Admitting: Genetic Counselor

## 2022-05-11 DIAGNOSIS — Z803 Family history of malignant neoplasm of breast: Secondary | ICD-10-CM

## 2022-05-11 NOTE — Progress Notes (Unsigned)
REFERRING PROVIDER: Owens Shark, NP [address] [place],  [place] [address]  PRIMARY PROVIDER:  Leamon Arnt, MD  PRIMARY REASON FOR VISIT:  No diagnosis found.   HISTORY OF PRESENT ILLNESS:   Rachel [name], a 47 y.o. female, was seen for a [place] cancer genetics consultation at the request of Ned Card, NP due to a family history of breast cancer.  Rachel [name] presents to clinic today to discuss the possibility of a hereditary predisposition to cancer, to discuss genetic testing, and to further clarify her future cancer risks, as well as potential cancer risks for family members.   Rachel [name] is a 47 y.o. female with no personal history of cancer.  ***  CANCER HISTORY:  Oncology History   No history exists.     RISK FACTORS:  Mammogram within the last year: *** Number of breast biopsies: 0. Colonoscopy: {Yes/No-Ex:120004}; {normal/abnormal/not examined:14677}. Hysterectomy: no.  Ovaries intact: yes.  Up to date with pelvic exams: {Yes/No-Ex:120004}. Menarche was at age 27.  First live birth at age 35.  Menopausal status: {Menopause:31378}.  OCP use for approximately {Numbers 1-12 multi-select:20307} years.  HRT use: 0 years. Dermatology screening: ***  Past Medical History:  Diagnosis Date   Anxiety    Diabetes mellitus without complication ([place])    type 2   Familial hyperlipidemia 08/17/2015   Gallstones    Migraine without aura, not intractable, without status migrainosus 12/07/2020   Migraines    Orthostatic hypotension 08/17/2015   Prediabetes    PUD (peptic ulcer disease)     Past Surgical History:  Procedure Laterality Date   CESAREAN SECTION     CHOLECYSTECTOMY N/A 10/18/2021   Procedure: LAPAROSCOPIC CHOLECYSTECTOMY;  Surgeon: Georganna Skeans, MD;  Location: [facility];  Service: General;  Laterality: N/A;    Social History   Socioeconomic History   Marital status: Married    Spouse name: Not on file   Number of children: 2   Years of  education: Not on file   Highest education level: Not on file  Occupational History   Occupation: clinical supervisor    Employer:   Tobacco Use   Smoking status: Never   Smokeless tobacco: Never  Vaping Use   Vaping Use: Never used  Substance and Sexual Activity   Alcohol use: No   Drug use: No   Sexual activity: Yes    Birth control/protection: Condom  Other Topics Concern   Not on file  Social History Narrative   Lives with her husband and their 2 children   Her son is wheelchair bound due to spina bifida   Social Determinants of Health   Financial Resource Strain: Low Risk  (04/06/2022)   Overall Financial Resource Strain (CARDIA)    Difficulty of Paying Living Expenses: Not hard at all  Food Insecurity: No Food Insecurity (04/06/2022)   Hunger Vital Sign    Worried About Running Out of Food in the Last Year: Never true    [place] in the Last Year: Never true  Transportation Needs: No Transportation Needs (04/06/2022)   PRAPARE - Hydrologist (Medical): No    Lack of Transportation (Non-Medical): No  Physical Activity: Insufficiently Active (04/06/2022)   Exercise Vital Sign    Days of Exercise per Week: 3 days    Minutes of Exercise per Session: 40 min  Stress: Not on file  Social Connections: Not on file     FAMILY HISTORY:  We obtained a detailed, 4-generation family history.  Significant diagnoses are listed below: Family History  Problem Relation Age of Onset   Hyperlipidemia Mother    Heart disease Sister    Hyperlipidemia Sister    Diabetes Sister    Irritable bowel syndrome Sister    Celiac disease Sister    Hyperlipidemia Daughter    Spina bifida Son    Breast cancer Paternal Aunt    Hyperlipidemia Other    Cancer Neg Hx     Rachel [name] is {aware/unaware} of previous family history of genetic testing for hereditary cancer risks. Patient's maternal ancestors are of *** descent, and paternal ancestors are  of *** descent. There {IS NO:12509} reported Ashkenazi Jewish ancestry. There {IS NO:12509} known consanguinity.  GENETIC COUNSELING ASSESSMENT: Rachel [name] is a 47 y.o. female with a {Personal/family:20331} history of {cancer/polyps} which is somewhat suggestive of a {DISEASE} and predisposition to cancer given ***. We, therefore, discussed and recommended the following at today's visit.   DISCUSSION: We discussed that *** - ***% of *** is hereditary, with most cases of hereditary *** cancer associated with ***.  There are other genes that can be associated with hereditary *** cancer syndromes.  These include ***.  We discussed that testing is beneficial for several reasons, including knowing about other cancer risks, identifying potential screening and risk-reduction options that may be appropriate, and to understanding if other family members could be at risk for cancer and allowing them to undergo genetic testing.  We reviewed the characteristics, features and inheritance patterns of hereditary cancer syndromes. We also discussed genetic testing, including the appropriate family members to test, the process of testing, insurance coverage and turn-around-time for results. We discussed the implications of a negative, positive, and variant of uncertain significant result. ***We discussed that negative results would be uninformative given that Rachel [name] does not have a personal history of cancer. We recommended Rachel [name] pursue genetic testing for a panel that contains genes associated with ***.  Rachel [name] was offered a common hereditary cancer panel (48 genes) and an expanded pan-cancer panel (85 genes). Rachel [name] was informed of the benefits and limitations of each panel, including that expanded pan-cancer panels contain several genes that do not have clear management guidelines at this point in time.  We also discussed that as the number of genes included on a panel increases, the chances of  variants of uncertain significance increases.  After considering the benefits and limitations of each gene panel, Rachel [name] elected to have an *** through ***.   Based on Rachel [name]'s {Personal/family:20331} history of cancer, she meets medical criteria for genetic testing. Despite that she meets criteria, she may still have an out of pocket cost. We discussed that if her out of pocket cost for testing is over $100, the laboratory should contact them to discuss self-pay options and/or patient pay assistance programs.   ***We reviewed the characteristics, features and inheritance patterns of hereditary cancer syndromes. We also discussed genetic testing, including the appropriate family members to test, the process of testing, insurance coverage and turn-around-time for results. We discussed the implications of a negative, positive and/or variant of uncertain significant result. In order to get genetic test results in a timely manner so that Rachel [name] can use these genetic test results for surgical decisions, we recommended Rachel [name] pursue genetic testing for the ***. Once complete, we recommend Rachel [name] pursue reflex genetic testing to the *** gene panel.   Based on Rachel [name]'s {  Personal/family:20331} history of cancer, she meets medical criteria for genetic testing. Despite that she meets criteria, she may still have an out of pocket cost.   ***We discussed with Rachel [name] that the {Personal/family:20331} history does not meet insurance or NCCN criteria for genetic testing and, therefore, is not highly consistent with a familial hereditary cancer syndrome.  We feel she is at low risk to harbor a gene mutation associated with such a condition. Thus, we did not recommend any genetic testing, at this time, and recommended Rachel [name] continue to follow the cancer screening guidelines given by her primary healthcare provider.  ***In order to estimate her chance of having a {CA GENE:62345}  mutation, we used statistical models ({GENMODELS:62370}) that consider her personal medical history, family history and ancestry.  Because each model is different, there can be a lot of variability in the risks they give.  Therefore, these numbers must be considered a rough range and not a precise risk of having a {CA GENE:62345} mutation.  These models estimate that she has approximately a ***-***% chance of having a mutation. Based on this assessment of her family and personal history, genetic testing {IS/ISNOT:34056} recommended.  ***Based on the patient's {Personal/family:20331} history, a statistical model ({GENMODELS:62370}) was used to estimate her risk of developing {CA HX:54794}. This estimates her lifetime risk of developing {CA HX:54794} to be approximately ***%. This estimation does not consider any genetic testing results.  The patient's lifetime breast cancer risk is a preliminary estimate based on available information using one of several models endorsed by the [place] (ACS). The ACS recommends consideration of breast MRI screening as an adjunct to mammography for patients at high risk (defined as 20% or greater lifetime risk).   ***Rachel [name] has been determined to be at high risk for breast cancer.  Therefore, we recommend that annual screening with mammography and breast MRI be performed.  ***begin at age 65, or 10 years prior to the age of breast cancer diagnosis in a relative (whichever is earlier).  We discussed that Ms. Bourbeau should discuss her individual situation with her referring physician and determine a breast cancer screening plan with which they are both comfortable.    We discussed the Genetic Information Non-Discrimination Act (GINA) of 2008, which helps protect individuals against genetic discrimination based on their genetic test results.  It impacts both health insurance and employment.  With health insurance, it protects against genetic test results being  used for increased premiums or policy termination. For employment, it protects against hiring, firing and promoting decisions based on genetic test results.  GINA does not apply to those in the [employer], those who work for companies with less than 15 employees, and new life insurance or long-term disability insurance policies.  Health status due to a cancer diagnosis is not protected under GINA.  PLAN: After considering the risks, benefits, and limitations, Ms. Salada provided informed consent to pursue genetic testing and the blood sample was sent to {Lab} Laboratories for analysis of the {test}. Results should be available within approximately {TAT TIME} weeks' time, at which point they will be disclosed by telephone to Ms. Blaylock, as will any additional recommendations warranted by these results. Ms. Jose will receive a summary of her genetic counseling visit and a copy of her results once available. This information will also be available in Epic.   *** Despite our recommendation, Ms. Leason did not wish to pursue genetic testing at today's visit. We understand this decision and remain available  to coordinate genetic testing at any time in the future. We, therefore, recommend Ms. Hancher continue to follow the cancer screening guidelines given by her primary healthcare provider.  ***Based on Ms. Seymour's family history, we recommended her ***, who was diagnosed with *** at age ***, have genetic counseling and testing. Ms. Banke will let us know if we can be of any assistance in coordinating genetic counseling and/or testing for this family member.   Lastly, we encouraged Ms. Murcia to remain in contact with cancer genetics annually so that we can continuously update the family history and inform her of any changes in cancer genetics and testing that may be of benefit for this family.   Ms. Clougherty questions were answered to her satisfaction today. Our contact information was provided should  additional questions or concerns arise. ***Thank you for the referral and allowing [place] to share in the care of your patient.   Dimitra Woodstock M. Joette Catching, [place], [facility] Genetic Counselor [email or web address] (P) [phone]   The patient was seen for a total of *** minutes in face-to-face genetic counseling.  ***The was patient was accompanied by ***.  ***The patient was seen alone.  Drs. Lindi Adie and/or Burr Medico were available to discuss this case as needed.  _______________________________________________________________________ For Office Staff:  Number of people involved in session: *** Was an Intern/ student involved with case: {YES/NO:63}

## 2022-05-14 ENCOUNTER — Other Ambulatory Visit: Payer: Self-pay

## 2022-05-14 ENCOUNTER — Inpatient Hospital Stay: Payer: 59

## 2022-05-14 ENCOUNTER — Inpatient Hospital Stay: Payer: 59 | Attending: Oncology | Admitting: Genetic Counselor

## 2022-05-14 DIAGNOSIS — Z803 Family history of malignant neoplasm of breast: Secondary | ICD-10-CM

## 2022-05-14 LAB — GENETIC SCREENING ORDER

## 2022-05-15 ENCOUNTER — Encounter: Payer: Self-pay | Admitting: Genetic Counselor

## 2022-05-21 ENCOUNTER — Ambulatory Visit
Admission: RE | Admit: 2022-05-21 | Discharge: 2022-05-21 | Disposition: A | Discharge status: Home / Self Care | Payer: 59 | Source: Ambulatory Visit | Attending: Nurse Practitioner | Admitting: Nurse Practitioner

## 2022-05-21 DIAGNOSIS — R790 Abnormal level of blood mineral: Secondary | ICD-10-CM

## 2022-05-21 DIAGNOSIS — Z803 Family history of malignant neoplasm of breast: Secondary | ICD-10-CM

## 2022-05-21 DIAGNOSIS — Z1231 Encounter for screening mammogram for malignant neoplasm of breast: Secondary | ICD-10-CM | POA: Diagnosis not present

## 2022-05-21 LAB — HM MAMMOGRAPHY

## 2022-05-25 ENCOUNTER — Encounter: Payer: Self-pay | Admitting: Family Medicine

## 2022-05-31 ENCOUNTER — Inpatient Hospital Stay: Payer: 59

## 2022-05-31 ENCOUNTER — Inpatient Hospital Stay: Payer: 59 | Admitting: Nurse Practitioner

## 2022-06-01 ENCOUNTER — Inpatient Hospital Stay: Payer: 59

## 2022-06-01 ENCOUNTER — Inpatient Hospital Stay: Payer: 59 | Admitting: Nurse Practitioner

## 2022-06-04 ENCOUNTER — Other Ambulatory Visit: Payer: Self-pay

## 2022-06-04 ENCOUNTER — Other Ambulatory Visit: Payer: 59

## 2022-06-04 ENCOUNTER — Ambulatory Visit: Payer: 59 | Admitting: Nurse Practitioner

## 2022-06-04 DIAGNOSIS — M25511 Pain in right shoulder: Secondary | ICD-10-CM

## 2022-06-06 ENCOUNTER — Inpatient Hospital Stay: Payer: 59 | Attending: Oncology

## 2022-06-06 ENCOUNTER — Ambulatory Visit: Payer: 59 | Admitting: Physical Therapy

## 2022-06-06 ENCOUNTER — Encounter: Payer: Self-pay | Admitting: Nurse Practitioner

## 2022-06-06 ENCOUNTER — Inpatient Hospital Stay: Payer: 59 | Admitting: Nurse Practitioner

## 2022-06-06 ENCOUNTER — Encounter: Payer: Self-pay | Admitting: Physical Therapy

## 2022-06-06 VITALS — BP 103/70 | HR 80 | Temp 98.2°F | Resp 18 | Ht 66.0 in | Wt 165.0 lb

## 2022-06-06 DIAGNOSIS — E611 Iron deficiency: Secondary | ICD-10-CM | POA: Diagnosis not present

## 2022-06-06 DIAGNOSIS — R790 Abnormal level of blood mineral: Secondary | ICD-10-CM

## 2022-06-06 DIAGNOSIS — Z803 Family history of malignant neoplasm of breast: Secondary | ICD-10-CM

## 2022-06-06 DIAGNOSIS — M25511 Pain in right shoulder: Secondary | ICD-10-CM | POA: Diagnosis not present

## 2022-06-06 LAB — CBC WITH DIFFERENTIAL (CANCER CENTER ONLY)
Abs Immature Granulocytes: 0.01 10*3/uL (ref 0.00–0.07)
Basophils Absolute: 0 10*3/uL (ref 0.0–0.1)
Basophils Relative: 1 %
Eosinophils Absolute: 0.1 10*3/uL (ref 0.0–0.5)
Eosinophils Relative: 3 %
HCT: 37.4 % (ref 36.0–46.0)
Hemoglobin: 12.5 g/dL (ref 12.0–15.0)
Immature Granulocytes: 0 %
Lymphocytes Relative: 50 %
Lymphs Abs: 2.2 10*3/uL (ref 0.7–4.0)
MCH: 27.7 pg (ref 26.0–34.0)
MCHC: 33.4 g/dL (ref 30.0–36.0)
MCV: 82.9 fL (ref 80.0–100.0)
Monocytes Absolute: 0.3 10*3/uL (ref 0.1–1.0)
Monocytes Relative: 7 %
Neutro Abs: 1.6 10*3/uL — ABNORMAL LOW (ref 1.7–7.7)
Neutrophils Relative %: 39 %
Platelet Count: 287 10*3/uL (ref 150–400)
RBC: 4.51 MIL/uL (ref 3.87–5.11)
RDW: 13.1 % (ref 11.5–15.5)
WBC Count: 4.3 10*3/uL (ref 4.0–10.5)
nRBC: 0 % (ref 0.0–0.2)

## 2022-06-06 LAB — FERRITIN: Ferritin: 15 ng/mL (ref 11–307)

## 2022-06-06 NOTE — Therapy (Signed)
OUTPATIENT PHYSICAL THERAPY UPPER EXTREMITY EVALUATION   Patient Name: Rachel [name] MRN: [id number] DOB:[birth year], 47 y.o., female Today's Date: 06/06/2022  END OF SESSION:  PT End of Session - 06/06/22 1328     Visit Number 1    Number of Visits 16    Date for PT Re-Evaluation 08/01/22    Authorization Type [facility]/[employer]    PT Start Time 1245    PT Stop Time 1318    PT Time Calculation (min) 33 min    Activity Tolerance Patient tolerated treatment well    Behavior During Therapy [facility] for tasks assessed/performed             Past Medical History:  Diagnosis Date   Anxiety    Diabetes mellitus without complication ([employer])    type 2   Familial hyperlipidemia 08/17/2015   Gallstones    Migraine without aura, not intractable, without status migrainosus 12/07/2020   Migraines    Orthostatic hypotension 08/17/2015   Prediabetes    PUD (peptic ulcer disease)    Past Surgical History:  Procedure Laterality Date   CESAREAN SECTION     CHOLECYSTECTOMY N/A 10/18/2021   Procedure: LAPAROSCOPIC CHOLECYSTECTOMY;  Surgeon: Georganna Skeans, MD;  Location: [facility] OR;  Service: General;  Laterality: N/A;   Patient Active Problem List   Diagnosis Date Noted   Chronic constipation 01/02/2022   TMJ (dislocation of temporomandibular joint) 01/10/2021   Menorrhagia with regular cycle 12/07/2020   Migraine without aura, not intractable, without status migrainosus 12/07/2020   Myalgia due to statin 09/02/2020   GERD (gastroesophageal reflux disease) Q000111Q   History of Helicobacter pylori infection 08/10/2020   History of peptic ulcer 08/10/2020   Adjustment reaction with anxiety 06/26/2017   Chronic migraine 12/28/2016   Prediabetes 12/12/2016   Insomnia 11/01/2015   Orthostatic hypotension 08/17/2015   Familial hyperlipidemia 08/17/2015    PCP: Billey Chang  REFERRING PROVIDER: Billey Chang  REFERRING DIAG: R shoulder pain   THERAPY DIAG:  Acute pain of right  shoulder  Rationale for Evaluation and Treatment: Rehabilitation  ONSET DATE: 2 months ago  SUBJECTIVE:                                                                                                                                                                                      SUBJECTIVE STATEMENT: Pt states New onset shoulder pain, about 2 months ago. States most pain with elevation, reaching up, out, and behind.  Notes Pain down into deltoid region. She did take round of prednisone about 2 months ago. Unable to take NSAIDs.  She is R handed  PERTINENT HISTORY: No other pain   PAIN:  Are you having pain? Yes: NPRS scale:  up to 10/10 Pain location: R shoulder  Pain description: significant, radiating  Aggravating factors: elevation, reaching, most use.  Relieving factors: none stated   PRECAUTIONS: None  WEIGHT BEARING RESTRICTIONS: No  FALLS:  Has patient fallen in last 6 months? No  PLOF: Independent  PATIENT GOALS: decreased pain in shoulder   NEXT MD VISIT:   OBJECTIVE:    PATIENT SURVEYS :    COGNITION: Overall cognitive status: Within functional limits for tasks assessed     SENSATION: WFL  POSTURE: Rounded shoulders   UPPER EXTREMITY ROM:   Active ROM Right Eval AROM Right Eval PROM Left  Eval  Shoulder flexion 120 135 wnl  Shoulder extension     Shoulder abduction 70 110 wnl  Shoulder adduction     Shoulder internal rotation wnl wnl   Shoulder external rotation Mild limitation Mild limitation wnl  Elbow flexion     Elbow extension     Wrist flexion     Wrist extension     Wrist ulnar deviation     Wrist radial deviation     Wrist pronation     Wrist supination     (Blank rows = not tested)  UPPER EXTREMITY MMT:  MMT Right eval Left eval  Shoulder flexion 3-   Shoulder extension    Shoulder abduction 3-   Shoulder adduction    Shoulder internal rotation 4+   Shoulder external rotation 4   Middle trapezius    Lower  trapezius    Elbow flexion    Elbow extension    Wrist flexion    Wrist extension    Wrist ulnar deviation    Wrist radial deviation    Wrist pronation    Wrist supination    Grip strength (lbs)    (Blank rows = not tested)  SHOULDER SPECIAL TESTS: Painful with elevation, unable to get into position for testing   JOINT MOBILITY TESTING:    Likley wfl, difficult to test due to pain with elevation   PALPATION:  Tenderness in superior shoulder, mild tenderness into anterior shoulder and pec.    TODAY'S TREATMENT:                                                                                                                                         DATE:   06/06/22 Therapeutic Exercise: Aerobic: Supine:  Flexion/AAROM 2 hand hold 2 x 5;  Shoulder ER AAROM/cane x 15 Seated: Standing:  Scap squeeze 2 x 5;  Stretches:  Neuromuscular Re-education: Manual Therapy: PROM R shoulder, all motions.  Therapeutic Activity: Self Care:   PATIENT EDUCATION:  Education details: PT POC, Exam findings, HEP Person educated: Patient Education method: Explanation, Demonstration, Tactile cues, Verbal cues, and Handouts Education comprehension: verbalized understanding, returned demonstration, verbal cues required, tactile cues required, and needs further education   HOME EXERCISE  PROGRAM: Access Code: [email or web address] URL: [email or web address] Date: 06/06/2022 Prepared by: Lyndee Hensen  Exercises - Supine Shoulder Flexion AAROM with Hands Clasped  - 2 x daily - 1 sets - 10 reps - Supine Shoulder External Rotation in 45 Degrees Abduction AAROM with [place]el  - 1 x daily - 1 sets - 10 reps - Standing Scapular Retraction  - 2 x daily - 1 sets - 10 reps    ASSESSMENT:  CLINICAL IMPRESSION: Patient presents with primary complaint of increased pain in R shoulder. Symptoms consistent with bursitis. She has significant pain today with attempts for elevation. She has limited ROM ,  strength, and limited use of UE for functional activities, lift, reach, carry, IADLs and work duties. Pt to benefit from skilled PT to improve deficits and pain.   OBJECTIVE IMPAIRMENTS: decreased activity tolerance, decreased ROM, decreased strength, impaired flexibility, impaired UE functional use, improper body mechanics, and pain.   ACTIVITY LIMITATIONS: carrying, lifting, bathing, dressing, reach over head, locomotion level, and caring for others  PARTICIPATION LIMITATIONS: meal prep, cleaning, laundry, driving, shopping, community activity, occupation, and yard work  PERSONAL FACTORS: Time since onset of injury/illness/exacerbation are also affecting patient's functional outcome.   REHAB POTENTIAL: Good  CLINICAL DECISION MAKING: Stable/uncomplicated  EVALUATION COMPLEXITY: Low  GOALS: Goals reviewed with patient? Yes  SHORT TERM GOALS: Target date: 06/20/2022  Patient will be independent with initial HEP  Goal status: INITIAL  2.  Pt to demo improved PROM of shoulder Flexion to be improved by at least 15 deg  Goal status: INITIAL    LONG TERM GOALS:   date: 08/01/2022   Patient will be independent with final HEP  Goal status: INITIAL  2.  Pt to report decreased pain in shoulder to 0-2/10 with reaching, lifting, carrying to improve ability for IADLs.    Goal status: INITIAL   3.  Pt to demo improved ROM of R shoulder, to be [facility] , to improve ability for ADLs.   Goal status: INITIAL   4.   Pt to demonstrate improved strength of shoulder to be at least 4/5, for elevation,  to improve ability for lift, carry, work and home duties.   Goal status: INITIAL     PLAN: PT FREQUENCY: 1-2x/week  PT DURATION: 8 weeks  PLANNED INTERVENTIONS: Therapeutic exercises, Therapeutic activity, Neuromuscular re-education, Patient/Family education, Self Care, Joint mobilization, Joint manipulation, Stair training, Orthotic/Fit training, DME instructions, Aquatic Therapy, Dry  Needling, Electrical stimulation, Cryotherapy, Moist heat, Taping, Ultrasound, Ionotophoresis 4mg /ml Dexamethasone, Manual therapy,  Vasopneumatic device, Traction, Spinal manipulation, Spinal mobilization,Balance training, Gait training,  PLAN FOR NEXT SESSION:    Lyndee Hensen, PT, DPT 1:39 PM  06/06/22

## 2022-06-06 NOTE — Progress Notes (Signed)
  [place] OFFICE PROGRESS NOTE   Diagnosis: Iron deficiency  INTERVAL HISTORY:   Ms. Barbaree returns as scheduled.  She began ferrous fumarate following her office visit 04/06/2022.  She notes constipation but has been able to take the oral iron consistently.  She takes a stool softener if needed.  She reports her menstrual cycle has been irregular for the past year.  She has not had a menstrual cycle for the past 5 months.  Objective:  Vital signs in last 24 hours:  Blood pressure 103/70, pulse 80, temperature 98.2 F (36.8 C), temperature source Oral, resp. rate 18, height 5\' 6"  (1.676 m), weight 165 lb (74.8 kg), SpO2 100 %.    HEENT: No thrush or ulcers. Resp: Lungs clear bilaterally. Cardio: Regular rate and rhythm. GI: Abdomen soft and nontender.  No hepatosplenomegaly. Vascular: No leg edema.   Lab Results:  Lab Results  Component Value Date   WBC 4.3 06/06/2022   HGB 12.5 06/06/2022   HCT 37.4 06/06/2022   MCV 82.9 06/06/2022   PLT 287 06/06/2022   NEUTROABS 1.6 (L) 06/06/2022    Imaging:  No results found.  Medications: I have reviewed the patient's current medications.  Assessment/Plan: Iron deficiency 04/06/2022 ferritin 9 Ferrous fumarate beginning 04/06/2022 04/10/2022 urine negative for blood 04/10/2022 stool cards negative x 3 06/06/2022 ferritin 15 History of menorrhagia Family history of breast cancer, seen by genetics counselor 05/14/2022 Hypercholesterolemia Migraines Relative lymphocytosis  Disposition: Ms. Stovall appears stable.  She has a history of iron deficiency without anemia.  We felt the iron deficiency was possibly due to previous menorrhagia.  She has gone without a menstrual cycle for 5 months now.  She has been taking oral iron consistently for about 2 months.  Ferritin is now in the low normal range.  We discussed IV iron but decided to continue ferrous fumarate as she is currently taking since the ferritin is  higher.    Urine and stool cards negative for blood.  We discussed a colonoscopy.  She plans to complete a Cologuard test.  We will defer further evaluation for a source of blood loss to her PCP.  She will return for a CBC and ferritin in 2 months.     Ned Card ANP/GNP-BC   06/06/2022  9:22 AM

## 2022-06-09 ENCOUNTER — Other Ambulatory Visit: Payer: Self-pay | Admitting: Family Medicine

## 2022-06-09 MED ORDER — AMOXICILLIN 875 MG PO TABS: 875.0000 mg | ORAL_TABLET | Freq: Two times a day (BID) | ORAL | 0 refills | Status: AC

## 2022-06-13 ENCOUNTER — Encounter: Payer: 59 | Admitting: Physical Therapy

## 2022-06-14 ENCOUNTER — Other Ambulatory Visit (HOSPITAL_COMMUNITY): Payer: Self-pay

## 2022-06-14 ENCOUNTER — Other Ambulatory Visit: Payer: Self-pay

## 2022-06-14 ENCOUNTER — Other Ambulatory Visit (HOSPITAL_BASED_OUTPATIENT_CLINIC_OR_DEPARTMENT_OTHER): Payer: Self-pay

## 2022-06-14 ENCOUNTER — Telehealth: Payer: Self-pay

## 2022-06-14 NOTE — Telephone Encounter (Signed)
Pharmacy Patient Advocate Encounter   Received notification from [facility] that prior authorization for REPATHA 140 MG/ML INJ is needed.    PA submitted on 06/14/22 Key [id number] Status is pending  Karie Soda, [place] Patient Advocate Specialist Direct Number: [phone] Fax: [phone]

## 2022-06-14 NOTE — Telephone Encounter (Signed)
Pharmacy Patient Advocate Encounter  Prior Authorization for REPATHA 140 MG/ML INJ has been approved.    Effective dates: 06/14/22 through 06/13/23  Karie Soda, [place] Patient Advocate Specialist Direct Number: [phone] Fax: [phone]

## 2022-06-15 ENCOUNTER — Other Ambulatory Visit (HOSPITAL_BASED_OUTPATIENT_CLINIC_OR_DEPARTMENT_OTHER): Payer: Self-pay

## 2022-06-18 ENCOUNTER — Other Ambulatory Visit (HOSPITAL_BASED_OUTPATIENT_CLINIC_OR_DEPARTMENT_OTHER): Payer: Self-pay

## 2022-06-20 ENCOUNTER — Ambulatory Visit (INDEPENDENT_AMBULATORY_CARE_PROVIDER_SITE_OTHER): Payer: 59 | Admitting: Physical Therapy

## 2022-06-20 ENCOUNTER — Encounter: Payer: Self-pay | Admitting: Physical Therapy

## 2022-06-20 ENCOUNTER — Telehealth: Payer: Self-pay | Admitting: Genetic Counselor

## 2022-06-20 DIAGNOSIS — M25511 Pain in right shoulder: Secondary | ICD-10-CM

## 2022-06-20 NOTE — Therapy (Signed)
OUTPATIENT PHYSICAL THERAPY UPPER EXTREMITY TREATMENT   Patient Name: Rachel [name] MRN: [id number] DOB:1975/09/28, 47 y.o., female Today's Date: 06/20/2022  END OF SESSION:  PT End of Session - 06/20/22 1153     Visit Number 2    Number of Visits 16    Date for PT Re-Evaluation 08/01/22    Authorization Type [facility]/[employer]    PT Start Time 1103    PT Stop Time 1141    PT Time Calculation (min) 38 min    Activity Tolerance Patient tolerated treatment well    Behavior During Therapy [facility] for tasks assessed/performed              Past Medical History:  Diagnosis Date   Anxiety    Diabetes mellitus without complication    type 2   Familial hyperlipidemia 08/17/2015   Gallstones    Migraine without aura, not intractable, without status migrainosus 12/07/2020   Migraines    Orthostatic hypotension 08/17/2015   Prediabetes    PUD (peptic ulcer disease)    Past Surgical History:  Procedure Laterality Date   CESAREAN SECTION     CHOLECYSTECTOMY N/A 10/18/2021   Procedure: LAPAROSCOPIC CHOLECYSTECTOMY;  Surgeon: Georganna Skeans, MD;  Location: [facility] OR;  Service: General;  Laterality: N/A;   Patient Active Problem List   Diagnosis Date Noted   Chronic constipation 01/02/2022   TMJ (dislocation of temporomandibular joint) 01/10/2021   Menorrhagia with regular cycle 12/07/2020   Migraine without aura, not intractable, without status migrainosus 12/07/2020   Myalgia due to statin 09/02/2020   GERD (gastroesophageal reflux disease) Q000111Q   History of Helicobacter pylori infection 08/10/2020   History of peptic ulcer 08/10/2020   Adjustment reaction with anxiety 06/26/2017   Chronic migraine 12/28/2016   Prediabetes 12/12/2016   Insomnia 11/01/2015   Orthostatic hypotension 08/17/2015   Familial hyperlipidemia 08/17/2015    PCP: Billey Chang  REFERRING PROVIDER: Billey Chang  REFERRING DIAG: R shoulder pain   THERAPY DIAG:  Acute pain of right  shoulder  Rationale for Evaluation and Treatment: Rehabilitation  ONSET DATE: 2 months ago  SUBJECTIVE:                                                                                                                                                                                      SUBJECTIVE STATEMENT: Pt states much pain with movement.   Eval: Pt states New onset shoulder pain, about 2 months ago. States most pain with elevation, reaching up, out, and behind.  Notes Pain down into deltoid region. She did take round of prednisone about 2 months ago. Unable to take NSAIDs.  She is R handed  PERTINENT HISTORY: No other pain   PAIN:  Are you having pain? Yes: NPRS scale:  up to 10/10 Pain location: R shoulder  Pain description: significant, radiating  Aggravating factors: elevation, reaching, most use.  Relieving factors: none stated   PRECAUTIONS: None  WEIGHT BEARING RESTRICTIONS: No  FALLS:  Has patient fallen in last 6 months? No  PLOF: Independent  PATIENT GOALS: decreased pain in shoulder   NEXT MD VISIT:   OBJECTIVE:    PATIENT SURVEYS :    COGNITION: Overall cognitive status: Within functional limits for tasks assessed     SENSATION: WFL  POSTURE: Rounded shoulders   UPPER EXTREMITY ROM:   Active ROM Right Eval AROM Right Eval PROM Left  Eval  Shoulder flexion 120 135 wnl  Shoulder extension     Shoulder abduction 70 110 wnl  Shoulder adduction     Shoulder internal rotation wnl wnl   Shoulder external rotation Mild limitation Mild limitation wnl  Elbow flexion     Elbow extension     Wrist flexion     Wrist extension     Wrist ulnar deviation     Wrist radial deviation     Wrist pronation     Wrist supination     (Blank rows = not tested)  UPPER EXTREMITY MMT:  MMT Right eval Left eval  Shoulder flexion 3-   Shoulder extension    Shoulder abduction 3-   Shoulder adduction    Shoulder internal rotation 4+   Shoulder  external rotation 4   Middle trapezius    Lower trapezius    Elbow flexion    Elbow extension    Wrist flexion    Wrist extension    Wrist ulnar deviation    Wrist radial deviation    Wrist pronation    Wrist supination    Grip strength (lbs)    (Blank rows = not tested)  SHOULDER SPECIAL TESTS: Painful with elevation, unable to get into position for testing   JOINT MOBILITY TESTING:    Likley wfl, difficult to test due to pain with elevation   PALPATION:  Tenderness in superior shoulder, mild tenderness into anterior shoulder and pec.    TODAY'S TREATMENT:                                                                                                                                         DATE:   06/20/22: Therapeutic Exercise: Aerobic: Supine:  Flexion/AAROM /cane x 15;  Shoulder ER AAROM/cane x 15;  ER/IR 2 lb x 15;   SA presses 2 x 10;  light pec stretch(arm on towel) 20 sec x 3 on R;  S/L : shoulder ER 2 x 10;  Seated: Standing:  Scap squeeze 2 x 10;  Stretches:  Neuromuscular Re-education: Manual Therapy: PROM R shoulder, all motions. LAD, light post mobs;  Therapeutic Activity: Modalities: iontophoresis with  dexamethsone , 4 hour patch, to R anterior shoulder.    06/06/22 Therapeutic Exercise: Aerobic: Supine:  Flexion/AAROM 2 hand hold 2 x 5;  Shoulder ER AAROM/cane x 15 Seated: Standing:  Scap squeeze 2 x 5;  Stretches:  Neuromuscular Re-education: Manual Therapy: PROM R shoulder, all motions.  Therapeutic Activity: Self Care:   PATIENT EDUCATION:  Education details: PT POC, Exam findings, HEP Person educated: Patient Education method: Explanation, Demonstration, Tactile cues, Verbal cues, and Handouts Education comprehension: verbalized understanding, returned demonstration, verbal cues required, tactile cues required, and needs further education   HOME EXERCISE PROGRAM: Access Code: [email or web address]   ASSESSMENT:  CLINICAL IMPRESSION: 06/20/2022 Pt  with good tolerance for exercises today. Improved ability for AROM for flex and ER and light strengthening today .Updated HEP to include light strength as able. Discussed continuing to not over do activity. Ionto added for pain at proximal bicep.   Eval: Patient presents with primary complaint of increased pain in R shoulder. Symptoms consistent with bursitis. She has significant pain today with attempts for elevation. She has limited ROM , strength, and limited use of UE for functional activities, lift, reach, carry, IADLs and work duties. Pt to benefit from skilled PT to improve deficits and pain.   OBJECTIVE IMPAIRMENTS: decreased activity tolerance, decreased ROM, decreased strength, impaired flexibility, impaired UE functional use, improper body mechanics, and pain.   ACTIVITY LIMITATIONS: carrying, lifting, bathing, dressing, reach over head, locomotion level, and caring for others  PARTICIPATION LIMITATIONS: meal prep, cleaning, laundry, driving, shopping, community activity, occupation, and yard work  PERSONAL FACTORS: Time since onset of injury/illness/exacerbation are also affecting patient's functional outcome.   REHAB POTENTIAL: Good  CLINICAL DECISION MAKING: Stable/uncomplicated  EVALUATION COMPLEXITY: Low  GOALS: Goals reviewed with patient? Yes  SHORT TERM GOALS: Target date: 06/20/2022  Patient will be independent with initial HEP  Goal status: INITIAL  2.  Pt to demo improved PROM of shoulder Flexion to be improved by at least 15 deg  Goal status: INITIAL    LONG TERM GOALS:   date: 08/01/2022   Patient will be independent with final HEP  Goal status: INITIAL  2.  Pt to report decreased pain in shoulder to 0-2/10 with reaching, lifting, carrying to improve ability for IADLs.    Goal status: INITIAL   3.  Pt to demo improved ROM of R shoulder, to be [facility] , to improve ability for ADLs.   Goal status: INITIAL   4.   Pt to demonstrate improved strength of  shoulder to be at least 4/5, for elevation,  to improve ability for lift, carry, work and home duties.   Goal status: INITIAL     PLAN: PT FREQUENCY: 1-2x/week  PT DURATION: 8 weeks  PLANNED INTERVENTIONS: Therapeutic exercises, Therapeutic activity, Neuromuscular re-education, Patient/Family education, Self Care, Joint mobilization, Joint manipulation, Stair training, Orthotic/Fit training, DME instructions, Aquatic Therapy, Dry Needling, Electrical stimulation, Cryotherapy, Moist heat, Taping, Ultrasound, Ionotophoresis 4mg /ml Dexamethasone, Manual therapy,  Vasopneumatic device, Traction, Spinal manipulation, Spinal mobilization,Balance training, Gait training,  PLAN FOR NEXT SESSION:    Lyndee Hensen, PT, DPT 11:54 AM  06/20/22

## 2022-06-20 NOTE — Telephone Encounter (Signed)
Called in attempt to disclose negative genetics.  Unable to LVM.  First attempt.

## 2022-07-04 ENCOUNTER — Encounter: Payer: Self-pay | Admitting: Physical Therapy

## 2022-07-04 ENCOUNTER — Ambulatory Visit (INDEPENDENT_AMBULATORY_CARE_PROVIDER_SITE_OTHER): Payer: 59 | Admitting: Physical Therapy

## 2022-07-04 DIAGNOSIS — M25511 Pain in right shoulder: Secondary | ICD-10-CM | POA: Diagnosis not present

## 2022-07-04 NOTE — Therapy (Signed)
OUTPATIENT PHYSICAL THERAPY UPPER EXTREMITY TREATMENT   Patient Name: Rachel [name] MRN: [id number] DOB:July 04, 1975, 47 y.o., female Today's Date: 07/04/2022  END OF SESSION:  PT End of Session - 07/04/22 0842     Visit Number 3    Number of Visits 16    Date for PT Re-Evaluation 08/01/22    Authorization Type [facility]/[employer]    PT Start Time 0802    PT Stop Time 0840    PT Time Calculation (min) 38 min    Activity Tolerance Patient tolerated treatment well    Behavior During Therapy [facility] for tasks assessed/performed               Past Medical History:  Diagnosis Date   Anxiety    Diabetes mellitus without complication    type 2   Familial hyperlipidemia 08/17/2015   Gallstones    Migraine without aura, not intractable, without status migrainosus 12/07/2020   Migraines    Orthostatic hypotension 08/17/2015   Prediabetes    PUD (peptic ulcer disease)    Past Surgical History:  Procedure Laterality Date   CESAREAN SECTION     CHOLECYSTECTOMY N/A 10/18/2021   Procedure: LAPAROSCOPIC CHOLECYSTECTOMY;  Surgeon: Violeta Gelinas, MD;  Location: [facility] OR;  Service: General;  Laterality: N/A;   Patient Active Problem List   Diagnosis Date Noted   Chronic constipation 01/02/2022   TMJ (dislocation of temporomandibular joint) 01/10/2021   Menorrhagia with regular cycle 12/07/2020   Migraine without aura, not intractable, without status migrainosus 12/07/2020   Myalgia due to statin 09/02/2020   GERD (gastroesophageal reflux disease) 08/10/2020   History of Helicobacter pylori infection 08/10/2020   History of peptic ulcer 08/10/2020   Adjustment reaction with anxiety 06/26/2017   Chronic migraine 12/28/2016   Prediabetes 12/12/2016   Insomnia 11/01/2015   Orthostatic hypotension 08/17/2015   Familial hyperlipidemia 08/17/2015    PCP: Asencion Partridge  REFERRING PROVIDER: Asencion Partridge  REFERRING DIAG: R shoulder pain   THERAPY DIAG:  Acute pain of right  shoulder  Rationale for Evaluation and Treatment: Rehabilitation  ONSET DATE: 2 months ago  SUBJECTIVE:                                                                                                                                                                                      SUBJECTIVE STATEMENT: Pt states pain at rest is better, not constant. Only hurting with certain motions, which she has been trying to avoid. Reaching up, out.   Eval: Pt states New onset shoulder pain, about 2 months ago. States most pain with elevation, reaching up, out, and behind.  Notes Pain down into deltoid region. She  did take round of prednisone about 2 months ago. Unable to take NSAIDs.  She is R handed  PERTINENT HISTORY: No other pain   PAIN:  Are you having pain? Yes: NPRS scale:  up to 10/10 Pain location: R shoulder  Pain description: significant, radiating  Aggravating factors: elevation, reaching, most use.  Relieving factors: none stated   PRECAUTIONS: None  WEIGHT BEARING RESTRICTIONS: No  FALLS:  Has patient fallen in last 6 months? No  PLOF: Independent  PATIENT GOALS: decreased pain in shoulder   NEXT MD VISIT:   OBJECTIVE:    PATIENT SURVEYS :    COGNITION: Overall cognitive status: Within functional limits for tasks assessed     SENSATION: WFL  POSTURE: Rounded shoulders   UPPER EXTREMITY ROM:   Active ROM Right Eval AROM Right Eval PROM Left  Eval  Shoulder flexion 120 135 wnl  Shoulder extension     Shoulder abduction 70 110 wnl  Shoulder adduction     Shoulder internal rotation wnl wnl   Shoulder external rotation Mild limitation Mild limitation wnl  Elbow flexion     Elbow extension     Wrist flexion     Wrist extension     Wrist ulnar deviation     Wrist radial deviation     Wrist pronation     Wrist supination     (Blank rows = not tested)  UPPER EXTREMITY MMT:  MMT Right eval Left eval  Shoulder flexion 3-   Shoulder extension     Shoulder abduction 3-   Shoulder adduction    Shoulder internal rotation 4+   Shoulder external rotation 4   Middle trapezius    Lower trapezius    Elbow flexion    Elbow extension    Wrist flexion    Wrist extension    Wrist ulnar deviation    Wrist radial deviation    Wrist pronation    Wrist supination    Grip strength (lbs)    (Blank rows = not tested)  SHOULDER SPECIAL TESTS: Painful with elevation, unable to get into position for testing   JOINT MOBILITY TESTING:    Likley wfl, difficult to test due to pain with elevation   PALPATION:  Tenderness in superior shoulder, mild tenderness into anterior shoulder and pec.    TODAY'S TREATMENT:                                                                                                                                         DATE:   07/04/22: Therapeutic Exercise: Aerobic: Supine:  Shoulder ER iso 3 sec x 15;  S/L : shoulder ER 2 x 10;  (x10 with 1 lb)  Seated: Standing:  Scap squeeze 1 x 10; Shoulder ext with cane x 10, horiz add behind back with cane x10; trial for IR behind back-painful.   ER isometric at wall 3 sec  x 10;  AROM- flexion to 90 deg x 10,  ab to 90 deg x 10;  Stretches:  Neuromuscular Re-education: Manual Therapy: PROM R shoulder, all motions. LAD, post mobs;  Therapeutic Activity: Modalities:    06/20/22: Therapeutic Exercise: Aerobic: Supine:  Flexion/AAROM /cane x 15;  Shoulder ER AAROM/cane x 15;  ER/IR 2 lb x 15;   SA presses 2 x 10;  light pec stretch(arm on towel) 20 sec x 3 on R;  S/L : shoulder ER 2 x 10;  Seated: Standing:  Scap squeeze 2 x 10;  Stretches:  Neuromuscular Re-education: Manual Therapy: PROM R shoulder, all motions. LAD, light post mobs;  Therapeutic Activity: Modalities: iontophoresis with dexamethsone , 4 hour patch, to R anterior shoulder.    06/06/22 Therapeutic Exercise: Aerobic: Supine:  Flexion/AAROM 2 hand hold 2 x 5;  Shoulder ER AAROM/cane x  15 Seated: Standing:  Scap squeeze 2 x 5;  Stretches:  Neuromuscular Re-education: Manual Therapy: PROM R shoulder, all motions.  Therapeutic Activity: Self Care:   PATIENT EDUCATION:  Education details: updated and reviewed HEP Person educated: Patient Education method: Explanation, Demonstration, Tactile cues, Verbal cues, and Handouts Education comprehension: verbalized understanding, returned demonstration, verbal cues required, tactile cues required, and needs further education   HOME EXERCISE PROGRAM: Access Code: [email or web address]   ASSESSMENT:  CLINICAL IMPRESSION: 07/04/2022 Pt with less pain and tenderness since last visit. She is able to tolerate ER strength today without pain. Updated HEP to include- will be focus for HEP.  She has good ability for flex and abd to 90 deg without pain. Will continue to work on Er strength and progress to elevation as tolerated.   Eval: Patient presents with primary complaint of increased pain in R shoulder. Symptoms consistent with bursitis. She has significant pain today with attempts for elevation. She has limited ROM , strength, and limited use of UE for functional activities, lift, reach, carry, IADLs and work duties. Pt to benefit from skilled PT to improve deficits and pain.   OBJECTIVE IMPAIRMENTS: decreased activity tolerance, decreased ROM, decreased strength, impaired flexibility, impaired UE functional use, improper body mechanics, and pain.   ACTIVITY LIMITATIONS: carrying, lifting, bathing, dressing, reach over head, locomotion level, and caring for others  PARTICIPATION LIMITATIONS: meal prep, cleaning, laundry, driving, shopping, community activity, occupation, and yard work  PERSONAL FACTORS: Time since onset of injury/illness/exacerbation are also affecting patient's functional outcome.   REHAB POTENTIAL: Good  CLINICAL DECISION MAKING: Stable/uncomplicated  EVALUATION COMPLEXITY: Low  GOALS: Goals reviewed with patient?  Yes  SHORT TERM GOALS: Target date: 06/20/2022  Patient will be independent with initial HEP  Goal status: INITIAL  2.  Pt to demo improved PROM of shoulder Flexion to be improved by at least 15 deg  Goal status: INITIAL    LONG TERM GOALS:   date: 08/01/2022   Patient will be independent with final HEP  Goal status: INITIAL  2.  Pt to report decreased pain in shoulder to 0-2/10 with reaching, lifting, carrying to improve ability for IADLs.    Goal status: INITIAL   3.  Pt to demo improved ROM of R shoulder, to be [facility] , to improve ability for ADLs.   Goal status: INITIAL   4.   Pt to demonstrate improved strength of shoulder to be at least 4/5, for elevation,  to improve ability for lift, carry, work and home duties.   Goal status: INITIAL     PLAN: PT FREQUENCY: 1-2x/week  PT DURATION: 8  weeks  PLANNED INTERVENTIONS: Therapeutic exercises, Therapeutic activity, Neuromuscular re-education, Patient/Family education, Self Care, Joint mobilization, Joint manipulation, Stair training, Orthotic/Fit training, DME instructions, Aquatic Therapy, Dry Needling, Electrical stimulation, Cryotherapy, Moist heat, Taping, Ultrasound, Ionotophoresis 4mg /ml Dexamethasone, Manual therapy,  Vasopneumatic device, Traction, Spinal manipulation, Spinal mobilization,Balance training, Gait training,  PLAN FOR NEXT SESSION:    Sedalia Muta, PT, DPT 8:42 AM  07/04/22

## 2022-07-05 ENCOUNTER — Other Ambulatory Visit: Payer: Self-pay

## 2022-07-05 ENCOUNTER — Other Ambulatory Visit (HOSPITAL_BASED_OUTPATIENT_CLINIC_OR_DEPARTMENT_OTHER): Payer: Self-pay

## 2022-07-05 MED ORDER — LIDOCAINE VISCOUS HCL 2 % MT SOLN
5.0000 mL | Freq: Two times a day (BID) | OROMUCOSAL | 1 refills | Status: DC | PRN
  Filled 2022-07-05: qty 180, 9d supply, fill #0
  Filled 2022-07-23: qty 180, 9d supply, fill #1

## 2022-07-06 ENCOUNTER — Ambulatory Visit: Payer: Self-pay | Admitting: Genetic Counselor

## 2022-07-06 DIAGNOSIS — Z1379 Encounter for other screening for genetic and chromosomal anomalies: Secondary | ICD-10-CM

## 2022-07-06 DIAGNOSIS — Z803 Family history of malignant neoplasm of breast: Secondary | ICD-10-CM

## 2022-07-07 ENCOUNTER — Encounter: Payer: Self-pay | Admitting: Genetic Counselor

## 2022-07-07 DIAGNOSIS — Z1379 Encounter for other screening for genetic and chromosomal anomalies: Secondary | ICD-10-CM | POA: Insufficient documentation

## 2022-07-07 NOTE — Progress Notes (Signed)
HPI:   [name] was previously seen in the [facility] due to a family history of breast cancer and concerns regarding a hereditary predisposition to cancer. Please refer to our prior cancer genetics clinic note for more information regarding our discussion, assessment and recommendations, at the time. [name] recent genetic test results were disclosed to her, as were recommendations warranted by these results. These results and recommendations are discussed in more detail below.  CANCER HISTORY:  [name] is a 47 y.o. female with no personal history of cancer.    FAMILY HISTORY:  We obtained a detailed, 4-generation family history.  Significant diagnoses are listed below:      Family History  Problem Relation Age of Onset   Breast cancer Paternal Aunt 42        bilateral; d. 61s       [name] is unaware of previous family history of genetic testing for hereditary cancer risks. Affected relatives are unavailable for genetic testing at this time. Patient's maternal and paternal ancestors are of [place] descent.  She has a few sisters that live in [place] and other relatives live in [place]. There is no reported Ashkenazi Jewish ancestry. There is no known consanguinity.  GENETIC TEST RESULTS:  The [facility] CancerNext-Expanded +RNAinsight Panel found no pathogenic mutations.   The CancerNext-Expanded gene panel offered by [facility] and includes sequencing, rearrangement, and RNA analysis for the following 71 genes:  AIP, ALK, APC, ATM, BAP1, BARD1, BMPR1A, BRCA1, BRCA2, BRIP1, CDC73, CDH1, CDK4, CDKN1B, CDKN2A, CHEK2, DICER1, FH, FLCN, KIF1B, LZTR1,MAX, MEN1, MET, MLH1, MSH2, MSH6, MUTYH, NF1, NF2, NTHL1, PALB2, PHOX2B, PMS2, POT1, PRKAR1A, PTCH1, PTEN, RAD51C,RAD51D, RB1, RET, SDHA, SDHAF2, SDHB, SDHC, SDHD, SMAD4, SMARCA4, SMARCB1, SMARCE1, STK11, SUFU, TMEM127, TP53,TSC1, TSC2 and VHL (sequencing and deletion/duplication); AXIN2, CTNNA1, EGFR,  EGLN1, HOXB13, KIT, MITF, MSH3, PDGFRA, POLD1 and POLE (sequencing only); EPCAM and GREM1 (deletion/duplication only).  .   The test report has been scanned into EPIC and is located under the Molecular Pathology section of the Results Review tab.  A portion of the result report is included below for reference. Genetic testing reported out on May 29, 2022.     Even though a pathogenic variant was not identified, possible explanations for the cancer in the family may include: There may be no hereditary risk for cancer in the family. The cancers in [name]'s family may be sporadic/familial or due to other genetic and environmental factors. There may be a gene mutation in one of these genes that current testing methods cannot detect but that chance is small. There could be another gene that has not yet been discovered, or that we have not yet tested, that is responsible for the cancer diagnoses in the family.  It is also possible there is a hereditary cause for the cancer in the family that [name] did not inherit.   Therefore, it is important to remain in touch with cancer genetics in the future so that we can continue to offer [name] the most up to date genetic testing.     ADDITIONAL GENETIC TESTING:  We discussed with [name] that her genetic testing was fairly extensive.  If there are additional relevant genes identified to increase cancer risk that can be analyzed in the future, we would be happy to discuss and coordinate this testing at that time.     CANCER SCREENING RECOMMENDATIONS:  [name] test result is considered negative (normal).  This means  that we have not identified a hereditary cause for her family history of breast cancer at this time.   An individual's cancer risk and medical management are not determined by genetic test results alone. Overall cancer risk assessment incorporates additional factors, including personal medical history, family history,  and any available genetic information that may result in a personalized plan for cancer prevention and surveillance. Therefore, it is recommended she continue to follow the cancer management and screening guidelines provided by her primary healthcare provider.  RECOMMENDATIONS FOR FAMILY MEMBERS:   Since she did not inherit a identifiable mutation in a cancer predisposition gene included on this panel, her children could not have inherited a known mutation from her in one of these genes. Individuals in this family might be at some increased risk of developing cancer, over the general population risk, due to the family history of cancer.  Individuals in the family should notify their providers of the family history of cancer. We recommend women in this family have a yearly mammogram beginning at age 14, or 6 years younger than the earliest onset of cancer, an annual clinical breast exam, and perform monthly breast self-exams.   Other members of the family may still carry a pathogenic variant in one of these genes that Ms. Kowal did not inherit. Based on the family history, we recommend relatives more closely related to her paternal aunt with a breast cancer history consider genetic counseling/testing.   FOLLOW-UP:  Lastly, we discussed with [name] that cancer genetics is a rapidly advancing field and it is possible that new genetic tests will be appropriate for her and/or her family members in the future. We encouraged her to remain in contact with cancer genetics on an annual basis so we can update her personal and family histories and let her know of advances in cancer genetics that may benefit this family.   Our contact number was provided. [name] questions were answered to her satisfaction, and she knows she is welcome to call us at anytime with additional questions or concerns.   Chistina Roston M. Rennie Plowman, MS, [facility] Genetic Counselor [email or web address] (P) [phone]

## 2022-07-11 ENCOUNTER — Ambulatory Visit (INDEPENDENT_AMBULATORY_CARE_PROVIDER_SITE_OTHER): Payer: 59 | Admitting: Physical Therapy

## 2022-07-11 ENCOUNTER — Encounter: Payer: Self-pay | Admitting: Physical Therapy

## 2022-07-11 DIAGNOSIS — M25511 Pain in right shoulder: Secondary | ICD-10-CM | POA: Diagnosis not present

## 2022-07-11 NOTE — Therapy (Signed)
OUTPATIENT PHYSICAL THERAPY UPPER EXTREMITY TREATMENT   Patient Name: Rachel [name] MRN: [id number] DOB:10-18-1975, 47 y.o., female Today's Date: 07/11/2022  END OF SESSION:  PT End of Session - 07/11/22 0803     Visit Number 4    Number of Visits 16    Date for PT Re-Evaluation 08/01/22    Authorization Type [facility]    PT Start Time 0802    PT Stop Time 0842    PT Time Calculation (min) 40 min    Activity Tolerance Patient tolerated treatment well    Behavior During Therapy [facility] for tasks assessed/performed               Past Medical History:  Diagnosis Date   Anxiety    Diabetes mellitus without complication    type 2   Familial hyperlipidemia 08/17/2015   Gallstones    Migraine without aura, not intractable, without status migrainosus 12/07/2020   Migraines    Orthostatic hypotension 08/17/2015   Prediabetes    PUD (peptic ulcer disease)    Past Surgical History:  Procedure Laterality Date   CESAREAN SECTION     CHOLECYSTECTOMY N/A 10/18/2021   Procedure: LAPAROSCOPIC CHOLECYSTECTOMY;  Surgeon: Violeta Gelinas, MD;  Location: [facility] OR;  Service: General;  Laterality: N/A;   Patient Active Problem List   Diagnosis Date Noted   Genetic testing 07/07/2022   Chronic constipation 01/02/2022   TMJ (dislocation of temporomandibular joint) 01/10/2021   Menorrhagia with regular cycle 12/07/2020   Migraine without aura, not intractable, without status migrainosus 12/07/2020   Myalgia due to statin 09/02/2020   GERD (gastroesophageal reflux disease) 08/10/2020   History of Helicobacter pylori infection 08/10/2020   History of peptic ulcer 08/10/2020   Adjustment reaction with anxiety 06/26/2017   Chronic migraine 12/28/2016   Prediabetes 12/12/2016   Insomnia 11/01/2015   Orthostatic hypotension 08/17/2015   Familial hyperlipidemia 08/17/2015    PCP: Asencion Partridge  REFERRING PROVIDER: Asencion Partridge  REFERRING DIAG: R shoulder pain   THERAPY DIAG:   Acute pain of right shoulder  Rationale for Evaluation and Treatment: Rehabilitation  ONSET DATE: 2 months ago  SUBJECTIVE:                                                                                                                                                                                      SUBJECTIVE STATEMENT: Pt states pain has been more this week and end of last week. She feels pain with most movements of arm.   Eval: Pt states New onset shoulder pain, about 2 months ago. States most pain with elevation, reaching up, out, and behind.  Notes Pain down into deltoid  region. She did take round of prednisone about 2 months ago. Unable to take NSAIDs.  She is R handed  PERTINENT HISTORY: No other pain   PAIN:  Are you having pain? Yes: NPRS scale:  up to 10/10 Pain location: R shoulder  Pain description: significant, radiating  Aggravating factors: elevation, reaching, most use.  Relieving factors: none stated   PRECAUTIONS: None  WEIGHT BEARING RESTRICTIONS: No  FALLS:  Has patient fallen in last 6 months? No  PLOF: Independent  PATIENT GOALS: decreased pain in shoulder   NEXT MD VISIT:   OBJECTIVE:    PATIENT SURVEYS :    COGNITION: Overall cognitive status: Within functional limits for tasks assessed     SENSATION: WFL  POSTURE: Rounded shoulders   UPPER EXTREMITY ROM:   Active ROM Right Eval AROM Right Eval PROM Left  Eval  Shoulder flexion 120 135 wnl  Shoulder extension     Shoulder abduction 70 110 wnl  Shoulder adduction     Shoulder internal rotation wnl wnl   Shoulder external rotation Mild limitation Mild limitation wnl  Elbow flexion     Elbow extension     Wrist flexion     Wrist extension     Wrist ulnar deviation     Wrist radial deviation     Wrist pronation     Wrist supination     (Blank rows = not tested)  UPPER EXTREMITY MMT:  MMT Right eval Left eval  Shoulder flexion 3-   Shoulder extension     Shoulder abduction 3-   Shoulder adduction    Shoulder internal rotation 4+   Shoulder external rotation 4   Middle trapezius    Lower trapezius    Elbow flexion    Elbow extension    Wrist flexion    Wrist extension    Wrist ulnar deviation    Wrist radial deviation    Wrist pronation    Wrist supination    Grip strength (lbs)    (Blank rows = not tested)  SHOULDER SPECIAL TESTS: Painful with elevation, unable to get into position for testing   JOINT MOBILITY TESTING:    Likley wfl, difficult to test due to pain with elevation   PALPATION:  Tenderness in superior shoulder, mild tenderness into anterior shoulder and pec.    TODAY'S TREATMENT:                                                                                                                                         DATE:   07/11/22: Therapeutic Exercise: Aerobic: Supine:  Shoulder ER 2lb x 8, 3lb x 8 limited ROM;  Seated: Standing:  Scap squeeze 1 x 10; Shoulder ext with cane x 10, horiz add behind back with cane x10;IR behind back with cane x 10;    Wall slides/flexion x 10 to tolerance  ER isometric at wall 3  sec x 10;   ER YTB , small ROM 2 x 10;  Stretches: Pec stretch low arms- in doorway 20 sec x 3;  Supine arm off side of table 30 sec x 2 ;  Neuromuscular Re-education: Manual Therapy: PROM R shoulder, all motions. LAD, Inf and post mobs; manual pec stretch    07/04/22: Therapeutic Exercise: Aerobic: Supine:  Shoulder ER iso 3 sec x 15;  S/L : shoulder ER 2 x 10;  (x10 with 1 lb)  Seated: Standing:  Scap squeeze 1 x 10; Shoulder ext with cane x 10, horiz add behind back with cane x10; trial for IR behind back-painful.   ER isometric at wall 3 sec x 10;  AROM- flexion to 90 deg x 10,  ab to 90 deg x 10;  Stretches:  Neuromuscular Re-education: Manual Therapy: PROM R shoulder, all motions. LAD, post mobs;  Therapeutic Activity: Modalities:    06/20/22: Therapeutic Exercise: Aerobic: Supine:   Flexion/AAROM /cane x 15;  Shoulder ER AAROM/cane x 15;  ER/IR 2 lb x 15;   SA presses 2 x 10;  light pec stretch(arm on towel) 20 sec x 3 on R;  S/L : shoulder ER 2 x 10;  Seated: Standing:  Scap squeeze 2 x 10;  Stretches:  Neuromuscular Re-education: Manual Therapy: PROM R shoulder, all motions. LAD, light post mobs;  Therapeutic Activity: Modalities: iontophoresis with dexamethsone , 4 hour patch, to R anterior shoulder.    06/06/22 Therapeutic Exercise: Aerobic: Supine:  Flexion/AAROM 2 hand hold 2 x 5;  Shoulder ER AAROM/cane x 15 Seated: Standing:  Scap squeeze 2 x 5;  Stretches:  Neuromuscular Re-education: Manual Therapy: PROM R shoulder, all motions.  Therapeutic Activity: Self Care:   PATIENT EDUCATION:  Education details: updated and reviewed HEP Person educated: Patient Education method: Explanation, Demonstration, Tactile cues, Verbal cues, and Handouts Education comprehension: verbalized understanding, returned demonstration, verbal cues required, tactile cues required, and needs further education   HOME EXERCISE PROGRAM: Access Code: [email or web address]   ASSESSMENT:  CLINICAL IMPRESSION: 07/11/2022 Pt with soreness this week. Minimal pain with exercises today. Reviewed HEP for this week in detail,with recommendation for more frequent movement throughout the day, for non - painful motions. Pt able to do ER iso and with small ROM/ band without pain. Also will benefit from stretching for anterior shoulder/pec, which is tight and sore today.   Eval: Patient presents with primary complaint of increased pain in R shoulder. Symptoms consistent with bursitis. She has significant pain today with attempts for elevation. She has limited ROM , strength, and limited use of UE for functional activities, lift, reach, carry, IADLs and work duties. Pt to benefit from skilled PT to improve deficits and pain.   OBJECTIVE IMPAIRMENTS: decreased activity tolerance, decreased ROM, decreased  strength, impaired flexibility, impaired UE functional use, improper body mechanics, and pain.   ACTIVITY LIMITATIONS: carrying, lifting, bathing, dressing, reach over head, locomotion level, and caring for others  PARTICIPATION LIMITATIONS: meal prep, cleaning, laundry, driving, shopping, community activity, occupation, and yard work  PERSONAL FACTORS: Time since onset of injury/illness/exacerbation are also affecting patient's functional outcome.   REHAB POTENTIAL: Good  CLINICAL DECISION MAKING: Stable/uncomplicated  EVALUATION COMPLEXITY: Low  GOALS: Goals reviewed with patient? Yes  SHORT TERM GOALS: Target date: 06/20/2022  Patient will be independent with initial HEP  Goal status: INITIAL  2.  Pt to demo improved PROM of shoulder Flexion to be improved by at least 15 deg  Goal status: INITIAL  LONG TERM GOALS:   date: 08/01/2022   Patient will be independent with final HEP  Goal status: INITIAL  2.  Pt to report decreased pain in shoulder to 0-2/10 with reaching, lifting, carrying to improve ability for IADLs.    Goal status: INITIAL   3.  Pt to demo improved ROM of R shoulder, to be [facility] , to improve ability for ADLs.   Goal status: INITIAL   4.   Pt to demonstrate improved strength of shoulder to be at least 4/5, for elevation,  to improve ability for lift, carry, work and home duties.   Goal status: INITIAL     PLAN: PT FREQUENCY: 1-2x/week  PT DURATION: 8 weeks  PLANNED INTERVENTIONS: Therapeutic exercises, Therapeutic activity, Neuromuscular re-education, Patient/Family education, Self Care, Joint mobilization, Joint manipulation, Stair training, Orthotic/Fit training, DME instructions, Aquatic Therapy, Dry Needling, Electrical stimulation, Cryotherapy, Moist heat, Taping, Ultrasound, Ionotophoresis 4mg /ml Dexamethasone, Manual therapy,  Vasopneumatic device, Traction, Spinal manipulation, Spinal mobilization,Balance training, Gait training,  PLAN  FOR NEXT SESSION:    Sedalia Muta, PT, DPT 9:05 AM  07/11/22

## 2022-07-18 ENCOUNTER — Encounter: Payer: 59 | Admitting: Physical Therapy

## 2022-07-18 ENCOUNTER — Encounter: Payer: Self-pay | Admitting: Physical Therapy

## 2022-07-18 ENCOUNTER — Ambulatory Visit (INDEPENDENT_AMBULATORY_CARE_PROVIDER_SITE_OTHER): Payer: 59 | Admitting: Physical Therapy

## 2022-07-18 DIAGNOSIS — M25511 Pain in right shoulder: Secondary | ICD-10-CM

## 2022-07-18 NOTE — Therapy (Signed)
OUTPATIENT PHYSICAL THERAPY UPPER EXTREMITY TREATMENT   Patient Name: Rachel [name] MRN: [id number] DOB:Mar 14, 1976, 47 y.o., female Today's Date: 07/18/2022  END OF SESSION:  PT End of Session - 07/18/22 1209     Visit Number 5    Number of Visits 16    Date for PT Re-Evaluation 08/01/22    Authorization Type [facility]    PT Start Time 1103    PT Stop Time 1145    PT Time Calculation (min) 42 min    Activity Tolerance Patient tolerated treatment well    Behavior During Therapy [facility] for tasks assessed/performed                Past Medical History:  Diagnosis Date   Anxiety    Diabetes mellitus without complication (HCC)    type 2   Familial hyperlipidemia 08/17/2015   Gallstones    Migraine without aura, not intractable, without status migrainosus 12/07/2020   Migraines    Orthostatic hypotension 08/17/2015   Prediabetes    PUD (peptic ulcer disease)    Past Surgical History:  Procedure Laterality Date   CESAREAN SECTION     CHOLECYSTECTOMY N/A 10/18/2021   Procedure: LAPAROSCOPIC CHOLECYSTECTOMY;  Surgeon: Violeta Gelinas, MD;  Location: [facility] OR;  Service: General;  Laterality: N/A;   Patient Active Problem List   Diagnosis Date Noted   Genetic testing 07/07/2022   Chronic constipation 01/02/2022   TMJ (dislocation of temporomandibular joint) 01/10/2021   Menorrhagia with regular cycle 12/07/2020   Migraine without aura, not intractable, without status migrainosus 12/07/2020   Myalgia due to statin 09/02/2020   GERD (gastroesophageal reflux disease) 08/10/2020   History of Helicobacter pylori infection 08/10/2020   History of peptic ulcer 08/10/2020   Adjustment reaction with anxiety 06/26/2017   Chronic migraine 12/28/2016   Prediabetes 12/12/2016   Insomnia 11/01/2015   Orthostatic hypotension 08/17/2015   Familial hyperlipidemia 08/17/2015    PCP: Asencion Partridge  REFERRING PROVIDER: Asencion Partridge  REFERRING DIAG: R shoulder pain   THERAPY  DIAG:  Acute pain of right shoulder  Rationale for Evaluation and Treatment: Rehabilitation  ONSET DATE: 2 months ago  SUBJECTIVE:                                                                                                                                                                                      SUBJECTIVE STATEMENT: Pt states pain has had variable pain. Does have a lot of time that she is ok/pain free, but still having lots of motions that cause pain.   Eval: Pt states New onset shoulder pain, about 2 months ago. States most pain with elevation, reaching up, out,  and behind.  Notes Pain down into deltoid region. She did take round of prednisone about 2 months ago. Unable to take NSAIDs.  She is R handed  PERTINENT HISTORY: No other pain   PAIN:  Are you having pain? Yes: NPRS scale:  up to 10/10 Pain location: R shoulder  Pain description: significant, radiating  Aggravating factors: elevation, reaching, most use.  Relieving factors: none stated   PRECAUTIONS: None  WEIGHT BEARING RESTRICTIONS: No  FALLS:  Has patient fallen in last 6 months? No  PLOF: Independent  PATIENT GOALS: decreased pain in shoulder   NEXT MD VISIT:   OBJECTIVE:    PATIENT SURVEYS :    COGNITION: Overall cognitive status: Within functional limits for tasks assessed     SENSATION: WFL  POSTURE: Rounded shoulders   UPPER EXTREMITY ROM:   Active ROM Right Eval AROM Right Eval PROM Left  Eval R 07/18/22  Shoulder flexion 120 135 wnl 125  Shoulder extension      Shoulder abduction 70 110 wnl 120  Shoulder adduction      Shoulder internal rotation wnl wnl    Shoulder external rotation Mild limitation Mild limitation wnl WNL  Elbow flexion      Elbow extension      Wrist flexion      Wrist extension      Wrist ulnar deviation      Wrist radial deviation      Wrist pronation      Wrist supination      (Blank rows = not tested)  UPPER EXTREMITY MMT:  MMT  Right eval Left eval  Shoulder flexion 3-   Shoulder extension    Shoulder abduction 3-   Shoulder adduction    Shoulder internal rotation 4+   Shoulder external rotation 4   Middle trapezius    Lower trapezius    Elbow flexion    Elbow extension    Wrist flexion    Wrist extension    Wrist ulnar deviation    Wrist radial deviation    Wrist pronation    Wrist supination    Grip strength (lbs)    (Blank rows = not tested)  SHOULDER SPECIAL TESTS: Painful with elevation, unable to get into position for testing   JOINT MOBILITY TESTING:    Likley wfl, difficult to test due to pain with elevation   PALPATION:  Tenderness in superior shoulder, mild tenderness into anterior shoulder and pec.    TODAY'S TREATMENT:                                                                                                                                         DATE:   07/18/22: Therapeutic Exercise: Aerobic: Supine:  SA press 2 x 10; horiz abd x 10;  Seated:  S/L: abd to 90 deg 2 x 10;  horiz abd 2 x 10;  Standing:  Scap squeeze 1 x 10; Shoulder ext with cane x 10, horiz add behind back with cane x10;IR behind back with cane x 10;    ER RTB , small ROM 2 x 10; ER ROM- near full 2 x 10;  Stretches: Pec stretch low arms- in doorway 20 sec x 3;   Neuromuscular Re-education: Manual Therapy: PROM R shoulder, all motions. LAD, Inf and post mobs;    07/04/22: Therapeutic Exercise: Aerobic: Supine:  Shoulder ER iso 3 sec x 15;  S/L : shoulder ER 2 x 10;  (x10 with 1 lb)  Seated: Standing:  Scap squeeze 1 x 10; Shoulder ext with cane x 10, horiz add behind back with cane x10; trial for IR behind back-painful.   ER isometric at wall 3 sec x 10;  AROM- flexion to 90 deg x 10,  ab to 90 deg x 10;  Stretches:  Neuromuscular Re-education: Manual Therapy: PROM R shoulder, all motions. LAD, post mobs;  Therapeutic Activity: Modalities:    06/20/22: Therapeutic Exercise: Aerobic: Supine:   Flexion/AAROM /cane x 15;  Shoulder ER AAROM/cane x 15;  ER/IR 2 lb x 15;   SA presses 2 x 10;  light pec stretch(arm on towel) 20 sec x 3 on R;  S/L : shoulder ER 2 x 10;  Seated: Standing:  Scap squeeze 2 x 10;  Stretches:  Neuromuscular Re-education: Manual Therapy: PROM R shoulder, all motions. LAD, light post mobs;  Therapeutic Activity: Modalities: iontophoresis with dexamethsone , 4 hour patch, to R anterior shoulder.    06/06/22 Therapeutic Exercise: Aerobic: Supine:  Flexion/AAROM 2 hand hold 2 x 5;  Shoulder ER AAROM/cane x 15 Seated: Standing:  Scap squeeze 2 x 5;  Stretches:  Neuromuscular Re-education: Manual Therapy: PROM R shoulder, all motions.  Therapeutic Activity: Self Care:   PATIENT EDUCATION:  Education details: updated and reviewed HEP Person educated: Patient Education method: Explanation, Demonstration, Tactile cues, Verbal cues, and Handouts Education comprehension: verbalized understanding, returned demonstration, verbal cues required, tactile cues required, and needs further education   HOME EXERCISE PROGRAM: Access Code: [email or web address]   ASSESSMENT:  CLINICAL IMPRESSION: 07/18/2022 Pt with less soreness overall, but still having bothersome pain and popping. Discussed continuing frequent but small movements throughout the day. She has improved arom for abduction and ER today, and has been able to do light strengthening with minimal pain. Most pain with elevation/flexion past 120 deg. Pt to benefit from continued care.   Eval: Patient presents with primary complaint of increased pain in R shoulder. Symptoms consistent with bursitis. She has significant pain today with attempts for elevation. She has limited ROM , strength, and limited use of UE for functional activities, lift, reach, carry, IADLs and work duties. Pt to benefit from skilled PT to improve deficits and pain.   OBJECTIVE IMPAIRMENTS: decreased activity tolerance, decreased ROM, decreased  strength, impaired flexibility, impaired UE functional use, improper body mechanics, and pain.   ACTIVITY LIMITATIONS: carrying, lifting, bathing, dressing, reach over head, locomotion level, and caring for others  PARTICIPATION LIMITATIONS: meal prep, cleaning, laundry, driving, shopping, community activity, occupation, and yard work  PERSONAL FACTORS: Time since onset of injury/illness/exacerbation are also affecting patient's functional outcome.   REHAB POTENTIAL: Good  CLINICAL DECISION MAKING: Stable/uncomplicated  EVALUATION COMPLEXITY: Low  GOALS: Goals reviewed with patient? Yes  SHORT TERM GOALS: Target date: 06/20/2022  Patient will be independent with initial HEP  Goal status: INITIAL  2.  Pt to demo improved PROM of shoulder Flexion to be  improved by at least 15 deg  Goal status: INITIAL    LONG TERM GOALS:   date: 08/01/2022   Patient will be independent with final HEP  Goal status: INITIAL  2.  Pt to report decreased pain in shoulder to 0-2/10 with reaching, lifting, carrying to improve ability for IADLs.    Goal status: INITIAL   3.  Pt to demo improved ROM of R shoulder, to be [facility] , to improve ability for ADLs.   Goal status: INITIAL   4.   Pt to demonstrate improved strength of shoulder to be at least 4/5, for elevation,  to improve ability for lift, carry, work and home duties.   Goal status: INITIAL     PLAN: PT FREQUENCY: 1-2x/week  PT DURATION: 8 weeks  PLANNED INTERVENTIONS: Therapeutic exercises, Therapeutic activity, Neuromuscular re-education, Patient/Family education, Self Care, Joint mobilization, Joint manipulation, Stair training, Orthotic/Fit training, DME instructions, Aquatic Therapy, Dry Needling, Electrical stimulation, Cryotherapy, Moist heat, Taping, Ultrasound, Ionotophoresis 4mg /ml Dexamethasone, Manual therapy,  Vasopneumatic device, Traction, Spinal manipulation, Spinal mobilization,Balance training, Gait training,  PLAN  FOR NEXT SESSION:    Sedalia Muta, PT, DPT 12:10 PM  07/18/22

## 2022-07-23 ENCOUNTER — Ambulatory Visit (INDEPENDENT_AMBULATORY_CARE_PROVIDER_SITE_OTHER): Payer: 59 | Admitting: Family Medicine

## 2022-07-23 ENCOUNTER — Encounter: Payer: Self-pay | Admitting: Family Medicine

## 2022-07-23 ENCOUNTER — Other Ambulatory Visit (HOSPITAL_BASED_OUTPATIENT_CLINIC_OR_DEPARTMENT_OTHER): Payer: Self-pay

## 2022-07-23 VITALS — BP 110/72 | HR 86 | Temp 98.0°F | Ht 66.0 in | Wt 162.4 lb

## 2022-07-23 DIAGNOSIS — G43009 Migraine without aura, not intractable, without status migrainosus: Secondary | ICD-10-CM

## 2022-07-23 DIAGNOSIS — G629 Polyneuropathy, unspecified: Secondary | ICD-10-CM

## 2022-07-23 LAB — B12 AND FOLATE PANEL
Folate: 12.7 ng/mL
Vitamin B-12: 458 pg/mL (ref 211–911)

## 2022-07-23 LAB — COMPREHENSIVE METABOLIC PANEL
ALT: 19 U/L (ref 0–35)
AST: 19 U/L (ref 0–37)
Albumin: 4.2 g/dL (ref 3.5–5.2)
Alkaline Phosphatase: 68 U/L (ref 39–117)
BUN: 12 mg/dL (ref 6–23)
CO2: 28 mEq/L (ref 19–32)
Calcium: 9.3 mg/dL (ref 8.4–10.5)
Chloride: 100 mEq/L (ref 96–112)
Creatinine, Ser: 0.52 mg/dL (ref 0.40–1.20)
GFR: 111.77 mL/min (ref >60.00)
Glucose, Bld: 105 mg/dL — ABNORMAL HIGH (ref 70–99)
Potassium: 4.1 mEq/L (ref 3.5–5.1)
Sodium: 138 mEq/L (ref 135–145)
Total Bilirubin: 0.6 mg/dL (ref 0.2–1.2)
Total Protein: 7.3 g/dL (ref 6.0–8.3)

## 2022-07-23 LAB — CBC WITH DIFFERENTIAL/PLATELET
Basophils Absolute: 0 10*3/uL (ref 0.0–0.1)
Basophils Relative: 0.8 % (ref 0.0–3.0)
Eosinophils Absolute: 0.2 10*3/uL (ref 0.0–0.7)
Eosinophils Relative: 4.4 % (ref 0.0–5.0)
HCT: 37.7 % (ref 36.0–46.0)
Hemoglobin: 13 g/dL (ref 12.0–15.0)
Lymphocytes Relative: 48.2 % — ABNORMAL HIGH (ref 12.0–46.0)
Lymphs Abs: 2.1 10*3/uL (ref 0.7–4.0)
MCHC: 34.4 g/dL (ref 30.0–36.0)
MCV: 82.7 fl (ref 78.0–100.0)
Monocytes Absolute: 0.4 10*3/uL (ref 0.1–1.0)
Monocytes Relative: 9.5 % (ref 3.0–12.0)
Neutro Abs: 1.6 10*3/uL (ref 1.4–7.7)
Neutrophils Relative %: 37.1 % — ABNORMAL LOW (ref 43.0–77.0)
Platelets: 275 10*3/uL (ref 150.0–400.0)
RBC: 4.55 Mil/uL (ref 3.87–5.11)
RDW: 14.3 % (ref 11.5–15.5)
WBC: 4.3 10*3/uL (ref 4.0–10.5)

## 2022-07-23 LAB — HEMOGLOBIN A1C: Hgb A1c MFr Bld: 5.7 % (ref 4.6–6.5)

## 2022-07-23 LAB — SEDIMENTATION RATE: Sed Rate: 21 mm/h — ABNORMAL HIGH (ref 0–20)

## 2022-07-23 LAB — VITAMIN B12: Vitamin B-12: 458 pg/mL (ref 211–911)

## 2022-07-23 NOTE — Progress Notes (Signed)
Subjective  CC:  Chief Complaint  Patient presents with   Tingling    Pt stated that she has been having some tingling in her face, hands and both feet since 07/21/2022. It is about the same as far as the tingling but it is constant     HPI: Rachel [name] is a 47 y.o. female who presents to the office today to address the problems listed above in the chief complaint. Patient reports that 3 to 4 days ago she noted some tingling on the right side of her face, very mild tingling on the left cheek as well.  Saturday, she became anxious while out at a big box warehouse store, was near a panic attack and was able to leave.  Those symptoms brought on tingling in the hands and feet and also a migraine.  The migraine was moderate but short-lived.  Treated with rest and resolved completely.  However still with tingling on the right side of her face, bilateral hands up to mid forearm and bilateral feet up to mid calf.  No rash, no weakness.  No prior symptoms of paresthesias.  No carpal tunnel symptoms.  She denies current anxiety symptoms.  No neck pain or back pain.  Assessment  1. Peripheral polyneuropathy   2. Migraine without aura, not intractable, without status migrainosus      Plan  Stocking glove paresthesias: Acute onset.  Possible long axonal demyelinating neuropathy however differential is broad.  Will start with lab work and urine testing.  Education given.  If persist, neurology and nerve conduction studies are recommended.  Patient understands and agrees.  She has no motor symptoms Migraine: Mild and resolved  Follow up: As scheduled for physical 08/29/2022  Orders Placed This Encounter  Procedures   CBC with Differential/Platelet   Comprehensive metabolic panel   Hemoglobin A1c   Vitamin B12   B12 and Folate Panel   ANA   Sedimentation rate   Heavy Metals Profile, Urine   No orders of the defined types were placed in this encounter.     I reviewed the patients updated  PMH, FH, and SocHx.    Patient Active Problem List   Diagnosis Date Noted   Myalgia due to statin 09/02/2020    Priority: High   Prediabetes 12/12/2016    Priority: High   Familial hyperlipidemia 08/17/2015    Priority: High   Menorrhagia with regular cycle 12/07/2020    Priority: Medium    Migraine without aura, not intractable, without status migrainosus 12/07/2020    Priority: Medium    GERD (gastroesophageal reflux disease) 08/10/2020    Priority: Medium    History of Helicobacter pylori infection 08/10/2020    Priority: Medium    History of peptic ulcer 08/10/2020    Priority: Medium    Adjustment reaction with anxiety 06/26/2017    Priority: Medium    Chronic migraine 12/28/2016    Priority: Medium    Insomnia 11/01/2015    Priority: Medium    Orthostatic hypotension 08/17/2015    Priority: Medium    TMJ (dislocation of temporomandibular joint) 01/10/2021    Priority: Low   Genetic testing 07/07/2022   Chronic constipation 01/02/2022   Current Meds  Medication Sig   Evolocumab (REPATHA SURECLICK) 140 MG/ML SOAJ Inject 1 pen into the skin every 14 (fourteen) days.   ferrous fumarate (HEMOCYTE - 106 MG FE) 325 (106 Fe) MG TABS tablet Take 1 tablet (106 mg of iron total) by mouth daily.  fluticasone (FLONASE) 50 MCG/ACT nasal spray Place 1 spray into both nostrils 2 (two) times daily. (Patient taking differently: Place 1 spray into both nostrils as needed for allergies.)   GI Cocktail (alum & mag hydroxide-simethicone/lidocaine)oral mixture Take 5-10 mLs by mouth 2 (two) times daily as needed.   glucose blood (FREESTYLE LITE) test strip Use once daily to check blood sugar R73.03   glucose monitoring kit (FREESTYLE) monitoring kit 1 each by Does not apply route daily.   Lancets MISC Use to check blood sugars daily. Please provide brand covered by insurance Dx code: R73.03   meclizine (ANTIVERT) 25 MG tablet Take 1 tablet (25 mg total) by mouth 3 (three) times daily as  needed for dizziness.   Multiple Vitamin (MULTIVITAMIN WITH MINERALS) TABS tablet Take 1 tablet by mouth daily.   rizatriptan (MAXALT-MLT) 10 MG disintegrating tablet Take 1 tablet (10 mg total) by mouth as needed for migraine. May repeat in 2 hours if needed    Allergies: Patient has No Known Allergies. Family History: Patient family history includes Breast cancer (age of onset: 42) in her paternal aunt; Celiac disease in her sister; Diabetes in her sister; Heart disease in her sister; Hyperlipidemia in her daughter, mother, sister, and another family member; Irritable bowel syndrome in her sister; Spina bifida in her son. Social History:  Patient  reports that she has never smoked. She has never used smokeless tobacco. She reports that she does not drink alcohol and does not use drugs.  Review of Systems: Constitutional: Negative for fever malaise or anorexia Cardiovascular: negative for chest pain Respiratory: negative for SOB or persistent cough Gastrointestinal: negative for abdominal pain , Objective  Vitals: BP 110/72   Pulse 86   Temp 98 F (36.7 C)   Ht 5\' 6"  (1.676 m)   Wt 162 lb 6.4 oz (73.7 kg)   SpO2 97%   BMI 26.21 kg/m  General: no acute distress , A&Ox3 Neuro: Normal strength throughout, normal gait.  +2 DTRs upper and lower extremities throughout, normal sensation  Commons side effects, risks, benefits, and alternatives for medications and treatment plan prescribed today were discussed, and the patient expressed understanding of the given instructions. Patient is instructed to call or message via MyChart if he/she has any questions or concerns regarding our treatment plan. No barriers to understanding were identified. We discussed Red Flag symptoms and signs in detail. Patient expressed understanding regarding what to do in case of urgent or emergency type symptoms.  Medication list was reconciled, printed and provided to the patient in AVS. Patient instructions and  summary information was reviewed with the patient as documented in the AVS. This note was prepared with assistance of Dragon voice recognition software. Occasional wrong-word or sound-a-like substitutions may have occurred due to the inherent limitations of voice recognition software

## 2022-07-23 NOTE — Patient Instructions (Signed)
Please follow up as scheduled for your next visit with me: 08/29/2022   I will release your lab results to you on your MyChart account with further instructions. You may see the results before I do, but when I review them I will send you a message with my report or have my assistant call you if things need to be discussed. Please reply to my message with any questions. Thank you!   If this persists, we will get EMG/NCV studies.   If you have any questions or concerns, please don't hesitate to send me a message via MyChart or call the office at [phone]. Thank you for visiting with [place] today! It's our pleasure caring for you.   Peripheral Neuropathy Peripheral neuropathy is a type of nerve damage. It affects nerves that carry signals between the spinal cord and the arms, legs, and the rest of the body (peripheral nerves). It does not affect nerves in the spinal cord or brain. In peripheral neuropathy, one nerve or a group of nerves may be damaged. Peripheral neuropathy is a broad category that includes many specific nerve disorders, like diabetic neuropathy, hereditary neuropathy, and carpal tunnel syndrome. What are the causes? This condition may be caused by: Certain diseases, such as: Diabetes. This is the most common cause of peripheral neuropathy. Autoimmune diseases, such as rheumatoid arthritis and systemic lupus erythematosus. Nerve diseases that are passed from parent to child (inherited). Kidney disease. Thyroid disease. Other causes may include: Nerve injury. Pressure or stress on a nerve that lasts a long time. Lack (deficiency) of B vitamins. This can result from alcoholism, poor diet, or a restricted diet. Infections. Some medicines, such as cancer medicines (chemotherapy). Poisonous (toxic) substances, such as lead and mercury. Too little blood flowing to the legs. In some cases, the cause of this condition is not known. What are the signs or symptoms? Symptoms of this  condition depend on which of your nerves is damaged. Symptoms in the legs, hands, and arms can include: Loss of feeling (numbness) in the feet, hands, or both. Tingling in the feet, hands, or both. Burning pain. Very sensitive skin. Weakness. Not being able to move a part of the body (paralysis). Clumsiness or poor coordination. Muscle twitching. Loss of balance. Symptoms in other parts of the body can include: Not being able to control your bladder. Feeling dizzy. Sexual problems. How is this diagnosed? Diagnosing and finding the cause of peripheral neuropathy can be difficult. Your health care provider will take your medical history and do a physical exam. A neurological exam will also be done. This involves checking things that are affected by your brain, spinal cord, and nerves (nervous system). For example, your health care provider will check your reflexes, how you move, and what you can feel. You may have other tests, such as: Blood tests. Electromyogram (EMG) and nerve conduction tests. These tests check nerve function and how well the nerves are controlling the muscles. Imaging tests, such as a CT scan or MRI, to rule out other causes of your symptoms. Removing a small piece of nerve to be examined in a lab (nerve biopsy). Removing and examining a small amount of the fluid that surrounds the brain and spinal cord (lumbar puncture). How is this treated? Treatment for this condition may involve: Treating the underlying cause of the neuropathy, such as diabetes, kidney disease, or vitamin deficiencies. Stopping medicines that can cause neuropathy, such as chemotherapy. Medicine to help relieve pain. Medicines may include: Prescription or over-the-counter pain  medicine. Anti-seizure medicine. Antidepressants. Pain-relieving patches that are applied to painful areas of skin. Surgery to relieve pressure on a nerve or to destroy a nerve that is causing pain. Physical therapy to  help improve movement and balance. Devices to help you move around (assistive devices). Follow these instructions at home: Medicines Take over-the-counter and prescription medicines only as told by your health care provider. Do not take any other medicines without first asking your health care provider. Ask your health care provider if the medicine prescribed to you requires you to avoid driving or using machinery. Lifestyle  Do not use any products that contain nicotine or tobacco. These products include cigarettes, chewing tobacco, and vaping devices, such as e-cigarettes. Smoking keeps blood from reaching damaged nerves. If you need help quitting, ask your health care provider. Avoid or limit alcohol. Too much alcohol can cause a vitamin B deficiency, and vitamin B is needed for healthy nerves. Eat a healthy diet. This includes: Eating foods that are high in fiber, such as beans, whole grains, and fresh fruits and vegetables. Limiting foods that are high in fat and processed sugars, such as fried or sweet foods. General instructions  If you have diabetes, work closely with your health care provider to keep your blood sugar under control. If you have numbness in your feet: Check every day for signs of injury or infection. Watch for redness, warmth, and swelling. Wear padded socks and comfortable shoes. These help protect your feet. Develop a good support system. Living with peripheral neuropathy can be stressful. Consider talking with a mental health specialist or joining a support group. Use assistive devices and attend physical therapy as told by your health care provider. This may include using a walker or a cane. Keep all follow-up visits. This is important. Where to find more information [employer] of Neurological Disorders: [employer] Contact a health care provider if: You have new signs or symptoms of peripheral neuropathy. You are struggling emotionally from dealing  with peripheral neuropathy. Your pain is not well controlled. Get help right away if: You have an injury or infection that is not healing normally. You develop new weakness in an arm or leg. You have fallen or do so frequently. Summary Peripheral neuropathy is when the nerves in the arms or legs are damaged, resulting in numbness, weakness, or pain. There are many causes of peripheral neuropathy, including diabetes, pinched nerves, vitamin deficiencies, autoimmune disease, and hereditary conditions. Diagnosing and finding the cause of peripheral neuropathy can be difficult. Your health care provider will take your medical history, do a physical exam, and do tests, including blood tests and nerve function tests. Treatment involves treating the underlying cause of the neuropathy and taking medicines to help control pain. Physical therapy and assistive devices may also help. This information is not intended to replace advice given to you by your health care provider. Make sure you discuss any questions you have with your health care provider. Document Revised: 11/08/2020 Document Reviewed: 11/08/2020 Elsevier Patient Education  2023 [employer].

## 2022-07-25 ENCOUNTER — Ambulatory Visit (INDEPENDENT_AMBULATORY_CARE_PROVIDER_SITE_OTHER): Payer: 59 | Admitting: Physical Therapy

## 2022-07-25 ENCOUNTER — Encounter: Payer: Self-pay | Admitting: Physical Therapy

## 2022-07-25 DIAGNOSIS — M25511 Pain in right shoulder: Secondary | ICD-10-CM | POA: Diagnosis not present

## 2022-07-25 NOTE — Therapy (Addendum)
OUTPATIENT PHYSICAL THERAPY UPPER EXTREMITY TREATMENT   Patient Name: Rachel [name] MRN: [id number] DOB:20-Aug-1975, 47 y.o., female Today's Date: 07/25/2022  END OF SESSION:  PT End of Session - 07/25/22 1039     Visit Number 6    Number of Visits 16    Date for PT Re-Evaluation 08/01/22    Authorization Type [facility]/[employer]    PT Start Time 0802    PT Stop Time 0844    PT Time Calculation (min) 42 min    Activity Tolerance Patient tolerated treatment well    Behavior During Therapy [facility] for tasks assessed/performed                Past Medical History:  Diagnosis Date   Anxiety    Diabetes mellitus without complication (HCC)    type 2   Familial hyperlipidemia 08/17/2015   Gallstones    Migraine without aura, not intractable, without status migrainosus 12/07/2020   Migraines    Orthostatic hypotension 08/17/2015   Prediabetes    PUD (peptic ulcer disease)    Past Surgical History:  Procedure Laterality Date   CESAREAN SECTION     CHOLECYSTECTOMY N/A 10/18/2021   Procedure: LAPAROSCOPIC CHOLECYSTECTOMY;  Surgeon: Violeta Gelinas, MD;  Location: [facility] OR;  Service: General;  Laterality: N/A;   Patient Active Problem List   Diagnosis Date Noted   Genetic testing 07/07/2022   Chronic constipation 01/02/2022   TMJ (dislocation of temporomandibular joint) 01/10/2021   Menorrhagia with regular cycle 12/07/2020   Migraine without aura, not intractable, without status migrainosus 12/07/2020   Myalgia due to statin 09/02/2020   GERD (gastroesophageal reflux disease) 08/10/2020   History of Helicobacter pylori infection 08/10/2020   History of peptic ulcer 08/10/2020   Adjustment reaction with anxiety 06/26/2017   Chronic migraine 12/28/2016   Prediabetes 12/12/2016   Insomnia 11/01/2015   Orthostatic hypotension 08/17/2015   Familial hyperlipidemia 08/17/2015    PCP: Asencion Partridge  REFERRING PROVIDER: Asencion Partridge  REFERRING DIAG: R shoulder pain   THERAPY  DIAG:  Acute pain of right shoulder  Rationale for Evaluation and Treatment: Rehabilitation  ONSET DATE: 2 months ago  SUBJECTIVE:                                                                                                                                                                                      SUBJECTIVE STATEMENT: Pt states some improvement of pain this week.  She also had had new onset of tingling into her R sided face, some in hands and feet. Has seen PCP, and has had some initial testing/bloodwork.   Eval: Pt states New onset shoulder pain, about 2 months  ago. States most pain with elevation, reaching up, out, and behind.  Notes Pain down into deltoid region. She did take round of prednisone about 2 months ago. Unable to take NSAIDs.  She is R handed  PERTINENT HISTORY: No other pain   PAIN:  Are you having pain? Yes: NPRS scale:  up to 5/10 Pain location: R shoulder  Pain description: significant, radiating  Aggravating factors: elevation, reaching, most use.  Relieving factors: none stated   PRECAUTIONS: None  WEIGHT BEARING RESTRICTIONS: No  FALLS:  Has patient fallen in last 6 months? No  PLOF: Independent  PATIENT GOALS: decreased pain in shoulder   NEXT MD VISIT:   OBJECTIVE:    PATIENT SURVEYS :    COGNITION: Overall cognitive status: Within functional limits for tasks assessed     SENSATION: WFL  POSTURE: Rounded shoulders   UPPER EXTREMITY ROM:   Active ROM Right Eval AROM Right Eval PROM Left  Eval R 07/18/22  Shoulder flexion 120 135 wnl 125  Shoulder extension      Shoulder abduction 70 110 wnl 120  Shoulder adduction      Shoulder internal rotation wnl wnl    Shoulder external rotation Mild limitation Mild limitation wnl WNL  Elbow flexion      Elbow extension      Wrist flexion      Wrist extension      Wrist ulnar deviation      Wrist radial deviation      Wrist pronation      Wrist supination      (Blank  rows = not tested)  UPPER EXTREMITY MMT:  MMT Right eval Left eval  Shoulder flexion 3-   Shoulder extension    Shoulder abduction 3-   Shoulder adduction    Shoulder internal rotation 4+   Shoulder external rotation 4   Middle trapezius    Lower trapezius    Elbow flexion    Elbow extension    Wrist flexion    Wrist extension    Wrist ulnar deviation    Wrist radial deviation    Wrist pronation    Wrist supination    Grip strength (lbs)    (Blank rows = not tested)  SHOULDER SPECIAL TESTS: Painful with elevation, unable to get into position for testing   JOINT MOBILITY TESTING:    Likley wfl, difficult to test due to pain with elevation   PALPATION:  Tenderness in superior shoulder, mild tenderness into anterior shoulder and pec.    TODAY'S TREATMENT:                                                                                                                                         DATE:   07/25/22: Therapeutic Exercise: Aerobic: Supine:  SA press 2 x 10; chest press 3 lb x 10;  shoulder ER/IR 2 lb x 15 ; Seated:  S/L:  Standing:  Scap squeeze 1 x 10; Shoulder ext with cane x 10, horiz add behind back with cane x10;IR behind back with cane x 10;    ER RTB , small ROM 3 x 10; ER ROM- near full  x 10;  Shoulder scaption/arom 2 x 10;  Stretches: Pec stretch low arms- in doorway 20 sec x 3;   Neuromuscular Re-education: Manual Therapy: PROM R shoulder, all motions. LAD, Inf and post mobs;    07/18/22: Therapeutic Exercise: Aerobic: Supine:  SA press 2 x 10; horiz abd x 10;  Seated:  S/L: abd to 90 deg 2 x 10;  horiz abd 2 x 10;  Standing:  Scap squeeze 1 x 10; Shoulder ext with cane x 10, horiz add behind back with cane x10;IR behind back with cane x 10;    ER RTB , small ROM 2 x 10; ER ROM- near full 2 x 10;  Stretches: Pec stretch low arms- in doorway 20 sec x 3;   Neuromuscular Re-education: Manual Therapy: PROM R shoulder, all motions. LAD, Inf and post  mobs;    PATIENT EDUCATION:  Education details: updated and reviewed HEP Person educated: Patient Education method: Explanation, Demonstration, Tactile cues, Verbal cues, and Handouts Education comprehension: verbalized understanding, returned demonstration, verbal cues required, tactile cues required, and needs further education   HOME EXERCISE PROGRAM: Access Code: [email or web address]   ASSESSMENT:  CLINICAL IMPRESSION: 07/25/22 Pt with improved ability for AROM for flexion and IR behind the back today, with less pain. She is making good progress in last couple weeks with improving strength , ROM, and pain. Pt to benefit from continued care.   Eval: Patient presents with primary complaint of increased pain in R shoulder. Symptoms consistent with bursitis. She has significant pain today with attempts for elevation. She has limited ROM , strength, and limited use of UE for functional activities, lift, reach, carry, IADLs and work duties. Pt to benefit from skilled PT to improve deficits and pain.   OBJECTIVE IMPAIRMENTS: decreased activity tolerance, decreased ROM, decreased strength, impaired flexibility, impaired UE functional use, improper body mechanics, and pain.   ACTIVITY LIMITATIONS: carrying, lifting, bathing, dressing, reach over head, locomotion level, and caring for others  PARTICIPATION LIMITATIONS: meal prep, cleaning, laundry, driving, shopping, community activity, occupation, and yard work  PERSONAL FACTORS: Time since onset of injury/illness/exacerbation are also affecting patient's functional outcome.   REHAB POTENTIAL: Good  CLINICAL DECISION MAKING: Stable/uncomplicated  EVALUATION COMPLEXITY: Low  GOALS: Goals reviewed with patient? Yes  SHORT TERM GOALS: Target date: 06/20/2022  Patient will be independent with initial HEP  Goal status: INITIAL  2.  Pt to demo improved PROM of shoulder Flexion to be improved by at least 15 deg  Goal status: INITIAL    LONG  TERM GOALS:   date: 08/01/2022   Patient will be independent with final HEP  Goal status: INITIAL  2.  Pt to report decreased pain in shoulder to 0-2/10 with reaching, lifting, carrying to improve ability for IADLs.    Goal status: INITIAL   3.  Pt to demo improved ROM of R shoulder, to be [facility] , to improve ability for ADLs.   Goal status: INITIAL   4.   Pt to demonstrate improved strength of shoulder to be at least 4/5, for elevation,  to improve ability for lift, carry, work and home duties.   Goal status: INITIAL     PLAN: PT FREQUENCY: 1-2x/week  PT DURATION: 8 weeks  PLANNED INTERVENTIONS: Therapeutic exercises, Therapeutic activity, Neuromuscular re-education, Patient/Family education, Self Care, Joint mobilization, Joint manipulation, Stair training, Orthotic/Fit training, DME instructions, Aquatic Therapy, Dry Needling, Electrical stimulation, Cryotherapy, Moist heat, Taping, Ultrasound, Ionotophoresis 4mg /ml Dexamethasone, Manual therapy,  Vasopneumatic device, Traction, Spinal manipulation, Spinal mobilization,Balance training, Gait training,  PLAN FOR NEXT SESSION:    Sedalia Muta, PT, DPT 10:40 AM  07/25/22  PHYSICAL THERAPY DISCHARGE SUMMARY  Visits from Start of Care: 6   Plan: Patient agrees to discharge.  Patient goals were partially met. Patient is being discharged due to - not returning since last visit.     Sedalia Muta, PT, DPT 11:13 AM  11/12/22

## 2022-07-26 LAB — ANTI-NUCLEAR AB-TITER (ANA TITER): ANA Titer 1: 1:40 {titer} — ABNORMAL HIGH

## 2022-07-26 LAB — ANA: Anti Nuclear Antibody (ANA): POSITIVE — AB

## 2022-07-26 LAB — HEAVY METALS PROFILE, URINE
Arsenic, 24H Ur: <10 mcg/L (ref <=80)
Lead, 24 hr urine: <10 mcg/L (ref <80)
Mercury, 24H Ur: <4 mcg/L (ref <=20)

## 2022-07-31 ENCOUNTER — Encounter: Payer: Self-pay | Admitting: Family Medicine

## 2022-07-31 DIAGNOSIS — R768 Other specified abnormal immunological findings in serum: Secondary | ICD-10-CM | POA: Insufficient documentation

## 2022-08-01 ENCOUNTER — Ambulatory Visit: Payer: 59 | Admitting: Nurse Practitioner

## 2022-08-01 ENCOUNTER — Other Ambulatory Visit: Payer: 59

## 2022-08-06 ENCOUNTER — Ambulatory Visit: Payer: 59 | Admitting: Nurse Practitioner

## 2022-08-06 ENCOUNTER — Inpatient Hospital Stay: Payer: 59

## 2022-08-08 ENCOUNTER — Encounter: Payer: 59 | Admitting: Physical Therapy

## 2022-08-09 ENCOUNTER — Encounter: Payer: 59 | Admitting: Physical Therapy

## 2022-08-15 ENCOUNTER — Encounter: Payer: 59 | Admitting: Physical Therapy

## 2022-08-27 ENCOUNTER — Ambulatory Visit (INDEPENDENT_AMBULATORY_CARE_PROVIDER_SITE_OTHER): Payer: 59 | Admitting: Family Medicine

## 2022-08-27 ENCOUNTER — Encounter: Payer: Self-pay | Admitting: Family Medicine

## 2022-08-27 VITALS — BP 100/70 | HR 81 | Temp 98.0°F | Ht 66.0 in | Wt 164.0 lb

## 2022-08-27 DIAGNOSIS — M25511 Pain in right shoulder: Secondary | ICD-10-CM

## 2022-08-27 DIAGNOSIS — G8929 Other chronic pain: Secondary | ICD-10-CM | POA: Diagnosis not present

## 2022-08-27 NOTE — Progress Notes (Signed)
Subjective  CC:  Chief Complaint  Patient presents with   Shoulder Pain    Pt stated that she is still having some Rt shoulder pain. She has been to PT but the sessions that she attended did not help     HPI: Rachel [name] is a 47 y.o. female who presents to the office today to address the problems listed above in the chief complaint. 47 year old female complains of persistent right shoulder pain.  First noted back in November.  She was evaluated with 2 days of pain.  Thought to be subdeltoid bursitis.  Treated conservatively with ice and NSAIDs.  However, pain persisted, patient elected to physical therapy.  Unfortunately, that has not been helpful.  She has limited use of the right arm due to pain.  Problems with certain range of motion.  Cannot lift her arm above her head without pain.  Assessment  1. Chronic right shoulder pain      Plan  Right shoulder pain: Ongoing for 7 months,.  Painful exam.  More about rotator cuff.  Elect urgent sports medicine referral.  Evaluate and treat  Follow up: For CPE 08/29/2022  Orders Placed This Encounter  Procedures   Ambulatory referral to Sports Medicine   No orders of the defined types were placed in this encounter.     I reviewed the patients updated PMH, FH, and SocHx.    Patient Active Problem List   Diagnosis Date Noted   Myalgia due to statin 09/02/2020    Priority: High   Prediabetes 12/12/2016    Priority: High   Familial hyperlipidemia 08/17/2015    Priority: High   Menorrhagia with regular cycle 12/07/2020    Priority: Medium    Migraine without aura, not intractable, without status migrainosus 12/07/2020    Priority: Medium    GERD (gastroesophageal reflux disease) 08/10/2020    Priority: Medium    History of Helicobacter pylori infection 08/10/2020    Priority: Medium    History of peptic ulcer 08/10/2020    Priority: Medium    Adjustment reaction with anxiety 06/26/2017    Priority: Medium    Chronic  migraine 12/28/2016    Priority: Medium    Insomnia 11/01/2015    Priority: Medium    Orthostatic hypotension 08/17/2015    Priority: Medium    TMJ (dislocation of temporomandibular joint) 01/10/2021    Priority: Low   Positive ANA (antinuclear antibody) 07/31/2022   Genetic testing 07/07/2022   Chronic constipation 01/02/2022   Current Meds  Medication Sig   Evolocumab (REPATHA SURECLICK) 140 MG/ML SOAJ Inject 1 pen into the skin every 14 (fourteen) days.   ferrous fumarate (HEMOCYTE - 106 MG FE) 325 (106 Fe) MG TABS tablet Take 1 tablet (106 mg of iron total) by mouth daily.   fluticasone (FLONASE) 50 MCG/ACT nasal spray Place 1 spray into both nostrils 2 (two) times daily. (Patient taking differently: Place 1 spray into both nostrils as needed for allergies.)   GI Cocktail (alum & mag hydroxide-simethicone/lidocaine)oral mixture Take 5-10 mLs by mouth 2 (two) times daily as needed.   glucose blood (FREESTYLE LITE) test strip Use once daily to check blood sugar R73.03   glucose monitoring kit (FREESTYLE) monitoring kit 1 each by Does not apply route daily.   Lancets MISC Use to check blood sugars daily. Please provide brand covered by insurance Dx code: R73.03   meclizine (ANTIVERT) 25 MG tablet Take 1 tablet (25 mg total) by mouth 3 (three) times daily  as needed for dizziness.   Multiple Vitamin (MULTIVITAMIN WITH MINERALS) TABS tablet Take 1 tablet by mouth daily.   rizatriptan (MAXALT-MLT) 10 MG disintegrating tablet Take 1 tablet (10 mg total) by mouth as needed for migraine. May repeat in 2 hours if needed   rosuvastatin (CRESTOR) 5 MG tablet Take 1 tablet (5 mg total) by mouth daily.    Allergies: Patient has No Known Allergies. Family History: Patient family history includes Breast cancer (age of onset: 24) in her paternal aunt; Celiac disease in her sister; Diabetes in her sister; Heart disease in her sister; Hyperlipidemia in her daughter, mother, sister, and another family  member; Irritable bowel syndrome in her sister; Spina bifida in her son. Social History:  Patient  reports that she has never smoked. She has never used smokeless tobacco. She reports that she does not drink alcohol and does not use drugs.  Review of Systems: Constitutional: Negative for fever malaise or anorexia Cardiovascular: negative for chest pain Respiratory: negative for SOB or persistent cough Gastrointestinal: negative for abdominal pain  Objective  Vitals: BP 100/70   Pulse 81   Temp 98 F (36.7 C)   Ht 5\' 6"  (1.676 m)   Wt 164 lb (74.4 kg)   SpO2 98%   BMI 26.47 kg/m  General: no acute distress , A&Ox3 Right shoulder: Severe pain with abduction at 90 degrees.  Unable to have internal rotation with abduction.  Tender posterior subacromial bursa  Commons side effects, risks, benefits, and alternatives for medications and treatment plan prescribed today were discussed, and the patient expressed understanding of the given instructions. Patient is instructed to call or message via MyChart if he/she has any questions or concerns regarding our treatment plan. No barriers to understanding were identified. We discussed Red Flag symptoms and signs in detail. Patient expressed understanding regarding what to do in case of urgent or emergency type symptoms.  Medication list was reconciled, printed and provided to the patient in AVS. Patient instructions and summary information was reviewed with the patient as documented in the AVS. This note was prepared with assistance of Dragon voice recognition software. Occasional wrong-word or sound-a-like substitutions may have occurred due to the inherent limitations of voice recognition software

## 2022-08-28 NOTE — Progress Notes (Unsigned)
    Rachel [name] D.[name] [address] [place] [address] Phone: [phone]   Assessment and Plan:     There are no diagnoses linked to this encounter.  ***   Pertinent previous records reviewed include ***   Follow Up: ***     Subjective:   I, Rachel [name], am serving as a Neurosurgeon for Doctor Richardean Sale  Chief Complaint: left shoulder pain   HPI:   08/29/2022 Patient is a 47 year old female complaining of left shoulder pain. Patient states  Relevant Historical Information: ***  Additional pertinent review of systems negative.   Current Outpatient Medications:    Evolocumab (REPATHA SURECLICK) 140 MG/ML SOAJ, Inject 1 pen into the skin every 14 (fourteen) days., Disp: 2 mL, Rfl: 11   ferrous fumarate (HEMOCYTE - 106 MG FE) 325 (106 Fe) MG TABS tablet, Take 1 tablet (106 mg of iron total) by mouth daily., Disp: 30 tablet, Rfl: 3   fluticasone (FLONASE) 50 MCG/ACT nasal spray, Place 1 spray into both nostrils 2 (two) times daily. (Patient taking differently: Place 1 spray into both nostrils as needed for allergies.), Disp: 16 g, Rfl: 6   GI Cocktail (alum & mag hydroxide-simethicone/lidocaine)oral mixture, Take 5-10 mLs by mouth 2 (two) times daily as needed., Disp: 180 mL, Rfl: 1   glucose blood (FREESTYLE LITE) test strip, Use once daily to check blood sugar R73.03, Disp: 100 each, Rfl: 4   glucose monitoring kit (FREESTYLE) monitoring kit, 1 each by Does not apply route daily., Disp: 1 each, Rfl: 0   Lancets MISC, Use to check blood sugars daily. Please provide brand covered by insurance Dx code: R73.03, Disp: 100 each, Rfl: 3   meclizine (ANTIVERT) 25 MG tablet, Take 1 tablet (25 mg total) by mouth 3 (three) times daily as needed for dizziness., Disp: 30 tablet, Rfl: 0   Multiple Vitamin (MULTIVITAMIN WITH MINERALS) TABS tablet, Take 1 tablet by mouth daily., Disp: , Rfl:    rizatriptan (MAXALT-MLT) 10 MG disintegrating  tablet, Take 1 tablet (10 mg total) by mouth as needed for migraine. May repeat in 2 hours if needed, Disp: 10 tablet, Rfl: 2   rosuvastatin (CRESTOR) 5 MG tablet, Take 1 tablet (5 mg total) by mouth daily., Disp: 90 tablet, Rfl: 3   Objective:     There were no vitals filed for this visit.    There is no height or weight on file to calculate BMI.    Physical Exam:    ***   Electronically signed by:  Rachel [name] D.[name] 7:14 AM 08/28/22

## 2022-08-29 ENCOUNTER — Ambulatory Visit (INDEPENDENT_AMBULATORY_CARE_PROVIDER_SITE_OTHER): Payer: 59 | Admitting: Family Medicine

## 2022-08-29 ENCOUNTER — Encounter: Payer: Self-pay | Admitting: Family Medicine

## 2022-08-29 ENCOUNTER — Ambulatory Visit: Payer: 59 | Admitting: Sports Medicine

## 2022-08-29 ENCOUNTER — Ambulatory Visit (INDEPENDENT_AMBULATORY_CARE_PROVIDER_SITE_OTHER): Payer: 59

## 2022-08-29 VITALS — BP 104/70 | HR 74 | Temp 97.8°F | Ht 66.0 in | Wt 165.2 lb

## 2022-08-29 VITALS — BP 104/72 | HR 82 | Ht 66.0 in | Wt 165.0 lb

## 2022-08-29 DIAGNOSIS — I951 Orthostatic hypotension: Secondary | ICD-10-CM

## 2022-08-29 DIAGNOSIS — E7849 Other hyperlipidemia: Secondary | ICD-10-CM | POA: Diagnosis not present

## 2022-08-29 DIAGNOSIS — R7303 Prediabetes: Secondary | ICD-10-CM

## 2022-08-29 DIAGNOSIS — K219 Gastro-esophageal reflux disease without esophagitis: Secondary | ICD-10-CM

## 2022-08-29 DIAGNOSIS — M25511 Pain in right shoulder: Secondary | ICD-10-CM | POA: Diagnosis not present

## 2022-08-29 DIAGNOSIS — R202 Paresthesia of skin: Secondary | ICD-10-CM | POA: Diagnosis not present

## 2022-08-29 DIAGNOSIS — Z0001 Encounter for general adult medical examination with abnormal findings: Secondary | ICD-10-CM | POA: Diagnosis not present

## 2022-08-29 DIAGNOSIS — G8929 Other chronic pain: Secondary | ICD-10-CM

## 2022-08-29 DIAGNOSIS — Z1211 Encounter for screening for malignant neoplasm of colon: Secondary | ICD-10-CM

## 2022-08-29 DIAGNOSIS — Z0000 Encounter for general adult medical examination without abnormal findings: Secondary | ICD-10-CM

## 2022-08-29 DIAGNOSIS — N951 Menopausal and female climacteric states: Secondary | ICD-10-CM | POA: Insufficient documentation

## 2022-08-29 DIAGNOSIS — G43009 Migraine without aura, not intractable, without status migrainosus: Secondary | ICD-10-CM

## 2022-08-29 NOTE — Progress Notes (Signed)
Subjective  Chief Complaint  Patient presents with   Annual Exam    Pt here for Annual Exam and is not currently fasting     HPI: Rachel [name] is a 47 y.o. female who presents to [facility] [place] today for a Female Wellness Visit. She also has the concerns and/or needs as listed above in the chief complaint. These will be addressed in addition to the Health Maintenance Visit.   Wellness Visit: annual visit with health maintenance review and exam without Pap  Health maintenance: Mammogram normal in current.  Pap smear up-to-date and current.  Had negative fecal occult blood testing earlier this year.  Has Cologuard at home and will do.  Immunizations are current.  Eye exam current.  Overall doing well.  Stressed at work.  Home life is stable. Chronic disease f/u and/or acute problem visit: (deemed necessary to be done in addition to the wellness visit): Prediabetes: With last A1c 5.7.  Diet is completely changed.  No symptoms of hyperglycemia. GERD is well-controlled Reviewed recent lab work.  Normal B12 levels.  Normal TSH, normal CBC. Hyperlipidemia, familial on Repatha and Crestor.  Due for fasting recheck.  Will come back fasting.  Tolerates well Migraines are currently well-controlled.  Intermittent.  No longer on preventatives. Paresthesias: See last note.  They have improved.  Now mild symptoms and intermittent.  Facial paresthesias have resolved.  In retrospect, she feels they mostly are related to anxiety or stress symptoms.  No new neurologic symptoms. Premenopausal with hot flushes.  Last menstrual cycle 8 months ago.  Reports female family members tend to have early menopause, mother likely around age 54.  Assessment  1. Annual physical exam   2. Screening for colorectal cancer   3. Prediabetes   4. Familial hyperlipidemia   5. Gastroesophageal reflux disease, unspecified whether esophagitis present   6. Orthostatic hypotension   7. Paresthesia   8.  Migraine without aura, not intractable, without status migrainosus   9. Perimenopausal      Plan  Female Wellness Visit: Age appropriate Health Maintenance and Prevention measures were discussed with patient. Included topics are cancer screening recommendations, ways to keep healthy (see AVS) including dietary and exercise recommendations, regular eye and dental care, use of seat belts, and avoidance of moderate alcohol use and tobacco use.  Urged to have Cologuard done. BMI: discussed patient's BMI and encouraged positive lifestyle modifications to help get to or maintain a target BMI. HM needs and immunizations were addressed and ordered. See below for orders. See HM and immunization section for updates. Routine labs and screening tests ordered including cmp, cbc and lipids where appropriate. Discussed recommendations regarding Vit D and calcium supplementation (see AVS)  Chronic disease management visit and/or acute problem visit: Prediabetes is well-controlled.  Continue healthy diet. Familial hyperlipidemia: Needs recheck.  Has restarted Repatha 140 and Crestor 5 nightly.  Tolerating well.  Normal LFTs GERD is well-controlled, no longer on PPI.  Chronic constipation has resolved with dietary changes as well. Migraines are controlled with stress management.  As needed Maxalt 10 Paresthesias: May be stress related.  She will continue to monitor.  Defers further evaluation as they are improving. Discuss perimenopausal symptoms.  Continue to monitor menstrual cycles.  Behavioral management for symptomatology.  Follow up: 12 months for complete physical, she will return fasting for lipid panel Orders Placed This Encounter  Procedures   Lipid panel   No orders of the defined types were placed in this  encounter.     Body mass index is 26.66 kg/m. Wt Readings from Last 3 Encounters:  08/29/22 165 lb 3.2 oz (74.9 kg)  08/27/22 164 lb (74.4 kg)  07/23/22 162 lb 6.4 oz (73.7 kg)      Patient Active Problem List   Diagnosis Date Noted   Myalgia due to statin 09/02/2020    Priority: High   Prediabetes 12/12/2016    Priority: High   Familial hyperlipidemia 08/17/2015    Priority: High    Repatha; statin intolerant Lipid clinic    Menorrhagia with regular cycle 12/07/2020    Priority: Medium     11/2020 - perimenopausal w/ occ missed cycles and at times heavy bleeding with IDA, improved with iron therapy. 2023: pt deferred TVUS or further eval. Treated with provera 10x10d. HX: had regular cycles at times heavy. Had menorrhagia in 2017; reviewed notes and transvag US. No fibroids and normal endometrium at that time. Had single ovarian cyst. Pt is overdue for pap smear. She reports adverse mood effects from OCPs x 2 in past    Migraine without aura, not intractable, without status migrainosus 12/07/2020    Priority: Medium    GERD (gastroesophageal reflux disease) 08/10/2020    Priority: Medium    History of Helicobacter pylori infection 08/10/2020    Priority: Medium    History of peptic ulcer 08/10/2020    Priority: Medium    Adjustment reaction with anxiety 06/26/2017    Priority: Medium    Chronic migraine 12/28/2016    Priority: Medium     Referred to neurology. Did not tolerate topiramate.    Insomnia 11/01/2015    Priority: Medium    Orthostatic hypotension 08/17/2015    Priority: Medium    TMJ (dislocation of temporomandibular joint) 01/10/2021    Priority: Low   Perimenopausal 08/29/2022   Positive ANA (antinuclear antibody) 07/31/2022    Low titre 1:40, nuclear speckled, nl esr.  Ordered due to hand/foot parasthesias.    Genetic testing 07/07/2022    Negative [facility] CancerNext-Expanded +RNAinsight.  Report date is 05/29/2022.   The CancerNext-Expanded gene panel offered by [facility] and includes sequencing, rearrangement, and RNA analysis for the following 71 genes:  AIP, ALK, APC, ATM, BAP1, BARD1, BMPR1A, BRCA1, BRCA2, BRIP1, CDC73,  CDH1, CDK4, CDKN1B, CDKN2A, CHEK2, DICER1, FH, FLCN, KIF1B, LZTR1,MAX, MEN1, MET, MLH1, MSH2, MSH6, MUTYH, NF1, NF2, NTHL1, PALB2, PHOX2B, PMS2, POT1, PRKAR1A, PTCH1, PTEN, RAD51C,RAD51D, RB1, RET, SDHA, SDHAF2, SDHB, SDHC, SDHD, SMAD4, SMARCA4, SMARCB1, SMARCE1, STK11, SUFU, TMEM127, TP53,TSC1, TSC2 and VHL (sequencing and deletion/duplication); AXIN2, CTNNA1, EGFR, EGLN1, HOXB13, KIT, MITF, MSH3, PDGFRA, POLD1 and POLE (sequencing only); EPCAM and GREM1 (deletion/duplication only).      Chronic constipation 01/02/2022    Resolved with gluten free and lactose free diet 2023    Health Maintenance  Topic Date Due   INFLUENZA VACCINE  10/18/2022   Diabetic kidney evaluation - Urine ACR  02/24/2023   COLON CANCER SCREENING ANNUAL FOBT  04/11/2023   MAMMOGRAM  05/21/2023   Diabetic kidney evaluation - eGFR measurement  07/23/2023   DTaP/Tdap/Td (2 - Td or Tdap) 01/18/2024   PAP SMEAR-Modifier  08/24/2026   Hepatitis C Screening  Completed   HIV Screening  Completed   HPV VACCINES  Aged Out   Colonoscopy  Discontinued   COVID-19 Vaccine  Discontinued   Immunization History  Administered Date(s) Administered   Influenza,inj,Quad PF,6+ Mos 12/27/2019, 12/21/2020, 12/16/2021   Influenza-Unspecified 12/13/2015, 12/06/2017, 12/09/2018   PFIZER(Purple Top)SARS-COV-2 Vaccination 08/24/2019,  09/12/2019   Tdap 01/17/2014   We updated and reviewed the patient's past history in detail and it is documented below. Allergies: Patient has No Known Allergies. Past Medical History Patient  has a past medical history of Anxiety, Diabetes mellitus without complication (HCC), Familial hyperlipidemia (08/17/2015), Gallstones, Migraine without aura, not intractable, without status migrainosus (12/07/2020), Migraines, Orthostatic hypotension (08/17/2015), Prediabetes, and PUD (peptic ulcer disease). Past Surgical History Patient  has a past surgical history that includes Cesarean section and Cholecystectomy  (N/A, 10/18/2021). Family History: Patient family history includes Breast cancer (age of onset: 24) in her paternal aunt; Celiac disease in her sister; Diabetes in her sister; Heart disease in her sister; Hyperlipidemia in her daughter, mother, sister, and another family member; Irritable bowel syndrome in her sister; Spina bifida in her son. Social History:  Patient  reports that she has never smoked. She has never used smokeless tobacco. She reports that she does not drink alcohol and does not use drugs.  Review of Systems: Constitutional: negative for fever or malaise Ophthalmic: negative for photophobia, double vision or loss of vision Cardiovascular: negative for chest pain, dyspnea on exertion, or new LE swelling Respiratory: negative for SOB or persistent cough Gastrointestinal: negative for abdominal pain, change in bowel habits or melena Genitourinary: negative for dysuria or gross hematuria, no abnormal uterine bleeding or disharge Musculoskeletal: negative for new gait disturbance or muscular weakness Integumentary: negative for new or persistent rashes, no breast lumps Neurological: negative for TIA or stroke symptoms Psychiatric: negative for SI or delusions Allergic/Immunologic: negative for hives  Patient Care Team    Relationship Specialty Notifications Start End  Willow Ora, MD PCP - General Family Medicine  07/18/22   Christell Constant, MD PCP - Cardiology Cardiology  09/01/20   Leta Baptist, PA-C Physician Assistant Gastroenterology  02/23/22   Carlus Pavlov, MD Consulting Physician Internal Medicine  02/23/22   Ladene Artist, MD Consulting Physician Hematology  04/09/22     Objective  Vitals: BP 104/70   Pulse 74   Temp 97.8 F (36.6 C)   Ht 5\' 6"  (1.676 m)   Wt 165 lb 3.2 oz (74.9 kg)   SpO2 99%   BMI 26.66 kg/m  General:  Well developed, well nourished, no acute distress  Psych:  Alert and orientedx3,normal mood and affect HEENT:   Normocephalic, atraumatic, non-icteric sclera,  supple neck without adenopathy, mass or thyromegaly Cardiovascular:  Normal S1, S2, RRR without gallop, rub or murmur Respiratory:  Good breath sounds bilaterally, CTAB with normal respiratory effort Gastrointestinal: normal bowel sounds, soft, non-tender, no noted masses. No HSM MSK: extremities without edema, joints without erythema or swelling Neurologic:    Mental status is normal.  Gross motor and sensory exams are normal.  No tremor  Commons side effects, risks, benefits, and alternatives for medications and treatment plan prescribed today were discussed, and the patient expressed understanding of the given instructions. Patient is instructed to call or message via MyChart if he/she has any questions or concerns regarding our treatment plan. No barriers to understanding were identified. We discussed Red Flag symptoms and signs in detail. Patient expressed understanding regarding what to do in case of urgent or emergency type symptoms.  Medication list was reconciled, printed and provided to the patient in AVS. Patient instructions and summary information was reviewed with the patient as documented in the AVS. This note was prepared with assistance of Dragon voice recognition software. Occasional wrong-word or sound-a-like substitutions may have occurred due  to the inherent limitations of voice recognition software

## 2022-08-29 NOTE — Patient Instructions (Addendum)
Shoulder HEP 4 week follow up  

## 2022-08-30 DIAGNOSIS — Z1211 Encounter for screening for malignant neoplasm of colon: Secondary | ICD-10-CM | POA: Diagnosis not present

## 2022-09-05 ENCOUNTER — Other Ambulatory Visit (HOSPITAL_BASED_OUTPATIENT_CLINIC_OR_DEPARTMENT_OTHER): Payer: Self-pay

## 2022-09-06 LAB — COLOGUARD: COLOGUARD: NEGATIVE

## 2022-09-15 LAB — HM DIABETES EYE EXAM

## 2022-09-19 ENCOUNTER — Other Ambulatory Visit (HOSPITAL_BASED_OUTPATIENT_CLINIC_OR_DEPARTMENT_OTHER): Payer: Self-pay

## 2022-09-21 ENCOUNTER — Other Ambulatory Visit (HOSPITAL_BASED_OUTPATIENT_CLINIC_OR_DEPARTMENT_OTHER): Payer: Self-pay

## 2022-09-25 NOTE — Progress Notes (Unsigned)
    Aleen Sells D.[name] [address] [place] [address] Phone: [phone]   Assessment and Plan:     There are no diagnoses linked to this encounter.  ***   Pertinent previous records reviewed include ***   Follow Up: ***     Subjective:   I, Rachel [name], am serving as a Neurosurgeon for Doctor Richardean Sale   Chief Complaint: left shoulder pain    HPI:    08/29/2022 Patient is a 47 year old female complaining of left shoulder pain. Patient states that she has pain for a few months now , she has been doing PT for about 6 session helped a little , but the pain is still there. Her pain has increased over the past 2 weeks . Posterior shoulder pain that radiates down to the deltoid. Numbness and tingling is intermittent, ibu for the pain and that helps a little , ice helps the most. She does get a popping sensation and it is painful, she is TTP    09/26/2022 Patient states    Relevant Historical Information: History of peptic ulcer, GERD  Additional pertinent review of systems negative.   Current Outpatient Medications:    Evolocumab (REPATHA SURECLICK) 140 MG/ML SOAJ, Inject 1 pen into the skin every 14 (fourteen) days., Disp: 2 mL, Rfl: 11   ferrous fumarate (HEMOCYTE - 106 MG FE) 325 (106 Fe) MG TABS tablet, Take 1 tablet (106 mg of iron total) by mouth daily., Disp: 30 tablet, Rfl: 3   fluticasone (FLONASE) 50 MCG/ACT nasal spray, Place 1 spray into both nostrils 2 (two) times daily. (Patient taking differently: Place 1 spray into both nostrils as needed for allergies.), Disp: 16 g, Rfl: 6   glucose blood (FREESTYLE LITE) test strip, Use once daily to check blood sugar R73.03, Disp: 100 each, Rfl: 4   Multiple Vitamin (MULTIVITAMIN WITH MINERALS) TABS tablet, Take 1 tablet by mouth daily., Disp: , Rfl:    rizatriptan (MAXALT-MLT) 10 MG disintegrating tablet, Take 1 tablet (10 mg total) by mouth as needed for migraine. May repeat  in 2 hours if needed, Disp: 10 tablet, Rfl: 2   rosuvastatin (CRESTOR) 5 MG tablet, Take 1 tablet (5 mg total) by mouth daily., Disp: 90 tablet, Rfl: 3   Objective:     There were no vitals filed for this visit.    There is no height or weight on file to calculate BMI.    Physical Exam:    ***   Electronically signed by:  Aleen Sells D[name] 1:02 PM 09/25/22

## 2022-09-26 ENCOUNTER — Ambulatory Visit: Payer: 59 | Admitting: Sports Medicine

## 2022-09-26 ENCOUNTER — Other Ambulatory Visit (HOSPITAL_BASED_OUTPATIENT_CLINIC_OR_DEPARTMENT_OTHER): Payer: Self-pay

## 2022-09-26 VITALS — BP 112/82 | HR 87 | Ht 66.0 in | Wt 164.0 lb

## 2022-09-26 DIAGNOSIS — M25511 Pain in right shoulder: Secondary | ICD-10-CM

## 2022-09-26 DIAGNOSIS — G8929 Other chronic pain: Secondary | ICD-10-CM

## 2022-09-26 MED ORDER — METHYLPREDNISOLONE 4 MG PO TBPK
ORAL_TABLET | ORAL | 0 refills | Status: DC
  Filled 2022-09-26: qty 21, 6d supply, fill #0

## 2022-09-26 NOTE — Patient Instructions (Signed)
Right shoulder MRI  Prednisone dos pak  Follow up 4 days after your Mri to discuss results

## 2022-10-05 ENCOUNTER — Encounter: Payer: Self-pay | Admitting: Sports Medicine

## 2022-10-07 ENCOUNTER — Ambulatory Visit
Admission: RE | Admit: 2022-10-07 | Discharge: 2022-10-07 | Disposition: A | Discharge status: Home / Self Care | Payer: 59 | Source: Ambulatory Visit | Attending: Sports Medicine | Admitting: Sports Medicine

## 2022-10-07 DIAGNOSIS — G8929 Other chronic pain: Secondary | ICD-10-CM

## 2022-10-09 ENCOUNTER — Other Ambulatory Visit (HOSPITAL_BASED_OUTPATIENT_CLINIC_OR_DEPARTMENT_OTHER): Payer: Self-pay

## 2022-10-09 ENCOUNTER — Encounter: Payer: Self-pay | Admitting: Sports Medicine

## 2022-10-09 DIAGNOSIS — F40240 Claustrophobia: Secondary | ICD-10-CM

## 2022-10-09 MED ORDER — LORAZEPAM 0.5 MG PO TABS
ORAL_TABLET | ORAL | 0 refills | Status: AC
Start: 2022-10-09 — End: ?
  Filled 2022-10-09: qty 4, 2d supply, fill #0

## 2022-10-11 ENCOUNTER — Other Ambulatory Visit (HOSPITAL_BASED_OUTPATIENT_CLINIC_OR_DEPARTMENT_OTHER): Payer: Self-pay

## 2022-10-11 ENCOUNTER — Other Ambulatory Visit: Payer: Self-pay

## 2022-10-11 MED ORDER — MEDROXYPROGESTERONE ACETATE 10 MG PO TABS
10.0000 mg | ORAL_TABLET | Freq: Every day | ORAL | 0 refills | Status: DC
  Filled 2022-10-11: qty 10, 10d supply, fill #0

## 2022-10-19 ENCOUNTER — Ambulatory Visit
Admission: RE | Admit: 2022-10-19 | Discharge: 2022-10-19 | Disposition: A | Discharge status: Home / Self Care | Payer: 59 | Source: Ambulatory Visit | Attending: Sports Medicine | Admitting: Sports Medicine

## 2022-10-19 DIAGNOSIS — M25511 Pain in right shoulder: Secondary | ICD-10-CM | POA: Diagnosis not present

## 2022-10-23 NOTE — Progress Notes (Unsigned)
    Rachel [name] D.[name] [address] [place] [address] Phone: [phone]   Assessment and Plan:     There are no diagnoses linked to this encounter.  ***   Pertinent previous records reviewed include ***   Follow Up: ***     Subjective:   I,  , am serving as a Neurosurgeon for Doctor Richardean Sale   Chief Complaint: right  shoulder pain    HPI:    08/29/2022 Patient is a 47 year old female complaining of right  shoulder pain. Patient states that she has pain for a few months now , she has been doing PT for about 6 session helped a little , but the pain is still there. Her pain has increased over the past 2 weeks . Posterior shoulder pain that radiates down to the deltoid. Numbness and tingling is intermittent, ibu for the pain and that helps a little , ice helps the most. She does get a popping sensation and it is painful, she is TTP     09/26/2022 Patient states that she had 3 painful days and the pain started to decrease. State she is 70% better but still has movements that cause pain    10/24/2022 Patient states    Relevant Historical Information: History of peptic ulcer, GERD  Additional pertinent review of systems negative.   Current Outpatient Medications:    Evolocumab (REPATHA SURECLICK) 140 MG/ML SOAJ, Inject 1 pen into the skin every 14 (fourteen) days., Disp: 2 mL, Rfl: 11   ferrous fumarate (HEMOCYTE - 106 MG FE) 325 (106 Fe) MG TABS tablet, Take 1 tablet (106 mg of iron total) by mouth daily., Disp: 30 tablet, Rfl: 3   fluticasone (FLONASE) 50 MCG/ACT nasal spray, Place 1 spray into both nostrils 2 (two) times daily. (Patient taking differently: Place 1 spray into both nostrils as needed for allergies.), Disp: 16 g, Rfl: 6   glucose blood (FREESTYLE LITE) test strip, Use once daily to check blood sugar R73.03, Disp: 100 each, Rfl: 4   LORazepam (ATIVAN) 0.5 MG tablet, Take 1-2 tablets by mouth 30 - 60  minutes prior to MRI. Do not drive with this medicine., Disp: 4 tablet, Rfl: 0   medroxyPROGESTERone (PROVERA) 10 MG tablet, Take 1 tablet (10 mg total) by mouth daily for 10 days., Disp: 10 tablet, Rfl: 0   methylPREDNISolone (MEDROL DOSEPAK) 4 MG TBPK tablet, Take as directed on packaging., Disp: 21 tablet, Rfl: 0   Multiple Vitamin (MULTIVITAMIN WITH MINERALS) TABS tablet, Take 1 tablet by mouth daily., Disp: , Rfl:    rizatriptan (MAXALT-MLT) 10 MG disintegrating tablet, Take 1 tablet (10 mg total) by mouth as needed for migraine. May repeat in 2 hours if needed, Disp: 10 tablet, Rfl: 2   rosuvastatin (CRESTOR) 5 MG tablet, Take 1 tablet (5 mg total) by mouth daily., Disp: 90 tablet, Rfl: 3   Objective:     There were no vitals filed for this visit.    There is no height or weight on file to calculate BMI.    Physical Exam:    ***   Electronically signed by:  Rachel [name] [name] 12:44 PM 10/23/22

## 2022-10-24 ENCOUNTER — Telehealth: Payer: 59 | Admitting: Sports Medicine

## 2022-10-24 DIAGNOSIS — G8929 Other chronic pain: Secondary | ICD-10-CM | POA: Diagnosis not present

## 2022-10-24 DIAGNOSIS — M25511 Pain in right shoulder: Secondary | ICD-10-CM | POA: Diagnosis not present

## 2022-11-07 ENCOUNTER — Telehealth: Payer: Self-pay | Admitting: Family Medicine

## 2022-11-07 NOTE — Telephone Encounter (Signed)
Noted  

## 2022-11-07 NOTE — Telephone Encounter (Signed)
Patient dropped off document FMLA, to be filled out by provider. Patient requested to send it back via Fax [phone] ASAP. Document is located in providers tray at front office.Please advise at Mobile [phone] (mobile)

## 2022-11-30 ENCOUNTER — Other Ambulatory Visit (HOSPITAL_BASED_OUTPATIENT_CLINIC_OR_DEPARTMENT_OTHER): Payer: Self-pay

## 2022-11-30 ENCOUNTER — Encounter: Payer: Self-pay | Admitting: Family Medicine

## 2022-11-30 ENCOUNTER — Ambulatory Visit (INDEPENDENT_AMBULATORY_CARE_PROVIDER_SITE_OTHER): Payer: 59 | Admitting: Family Medicine

## 2022-11-30 VITALS — BP 98/68 | Temp 98.3°F | Ht 66.0 in | Wt 163.6 lb

## 2022-11-30 DIAGNOSIS — F4322 Adjustment disorder with anxiety: Secondary | ICD-10-CM | POA: Diagnosis not present

## 2022-11-30 MED ORDER — BUSPIRONE HCL 7.5 MG PO TABS
7.5000 mg | ORAL_TABLET | Freq: Two times a day (BID) | ORAL | 2 refills | Status: DC | PRN
  Filled 2022-11-30: qty 60, 15d supply, fill #0

## 2022-11-30 MED ORDER — ESCITALOPRAM OXALATE 10 MG PO TABS
10.0000 mg | ORAL_TABLET | Freq: Every day | ORAL | 3 refills | Status: DC
  Filled 2022-11-30: qty 90, 90d supply, fill #0

## 2022-11-30 NOTE — Progress Notes (Signed)
Subjective  CC:  Chief Complaint  Patient presents with   Anxiety    HPI: Rachel [name] is a 47 y.o. female who presents to the office today to address the problems listed above in the chief complaint. [name] presents due to stressors.  Has multiple life stressors, dealing with this very serious health concern with her son who is full care.  Reports she has trouble shutting her brain off.  Constant worrying and thinking about things that need to be done.  Fortunately she is sleeping well.  Has a home nurse to help care for her son at this time.  Mood is very good however.  Feels happy and bright but is struggling with attention, focus and the chronic weight of anxiety.  No ongoing panic symptoms.  She had tried Zoloft last year for similar symptoms but had significant side effects of migraines.  No other mood medicines have been used.  She has used Xanax very intermittently but it does make her drowsy.  Assessment  1. Adjustment reaction with anxiety      Plan  Adjustment reaction: Education counseling done.  Trial of Lexapro 10 daily.  Education given.  Appropriate expectations discussed.  Also can try BuSpar as needed  Follow up: 6 weeks to recheck Visit date not found  No orders of the defined types were placed in this encounter.  No orders of the defined types were placed in this encounter.     I reviewed the patients updated PMH, FH, and SocHx.    Patient Active Problem List   Diagnosis Date Noted   Myalgia due to statin 09/02/2020    Priority: High   Prediabetes 12/12/2016    Priority: High   Familial hyperlipidemia 08/17/2015    Priority: High   Perimenopausal 08/29/2022    Priority: Medium    Positive ANA (antinuclear antibody) 07/31/2022    Priority: Medium    Migraine without aura, not intractable, without status migrainosus 12/07/2020    Priority: Medium    GERD (gastroesophageal reflux disease) 08/10/2020    Priority: Medium    History of Helicobacter pylori  infection 08/10/2020    Priority: Medium    History of peptic ulcer 08/10/2020    Priority: Medium    Adjustment reaction with anxiety 06/26/2017    Priority: Medium    Chronic migraine 12/28/2016    Priority: Medium    Insomnia 11/01/2015    Priority: Medium    Orthostatic hypotension 08/17/2015    Priority: Medium    Chronic constipation 01/02/2022    Priority: Low   TMJ (dislocation of temporomandibular joint) 01/10/2021    Priority: Low   Genetic testing 07/07/2022   No outpatient medications have been marked as taking for the 11/30/22 encounter (Office Visit) with Willow Ora, MD.    Allergies: Patient has No Known Allergies. Family History: Patient family history includes Breast cancer (age of onset: 83) in her paternal aunt; Celiac disease in her sister; Diabetes in her sister; Heart disease in her sister; Hyperlipidemia in her daughter, mother, sister, and another family member; Irritable bowel syndrome in her sister; Spina bifida in her son. Social History:  Patient  reports that she has never smoked. She has never used smokeless tobacco. She reports that she does not drink alcohol and does not use drugs.  Review of Systems: Constitutional: Negative for fever malaise or anorexia Cardiovascular: negative for chest pain Respiratory: negative for SOB or persistent cough Gastrointestinal: negative for abdominal pain  Objective  Vitals:  BP 98/68   Temp 98.3 F (36.8 C)   Ht 5\' 6"  (1.676 m)   Wt 163 lb 9.6 oz (74.2 kg)   SpO2 98%   BMI 26.41 kg/m  General: no acute distress , A&Ox3 Psych: Appears happy, good  Commons side effects, risks, benefits, and alternatives for medications and treatment plan prescribed today were discussed, and the patient expressed understanding of the given instructions. Patient is instructed to call or message via MyChart if he/she has any questions or concerns regarding our treatment plan. No barriers to understanding were identified. We  discussed Red Flag symptoms and signs in detail. Patient expressed understanding regarding what to do in case of urgent or emergency type symptoms.  Medication list was reconciled, printed and provided to the patient in AVS. Patient instructions and summary information was reviewed with the patient as documented in the AVS. This note was prepared with assistance of Dragon voice recognition software. Occasional wrong-word or sound-a-like substitutions may have occurred due to the inherent limitations of voice recognition software

## 2022-12-17 ENCOUNTER — Other Ambulatory Visit (HOSPITAL_BASED_OUTPATIENT_CLINIC_OR_DEPARTMENT_OTHER): Payer: Self-pay

## 2022-12-17 ENCOUNTER — Ambulatory Visit (INDEPENDENT_AMBULATORY_CARE_PROVIDER_SITE_OTHER): Payer: 59 | Admitting: Family Medicine

## 2022-12-17 ENCOUNTER — Encounter: Payer: Self-pay | Admitting: Family Medicine

## 2022-12-17 VITALS — BP 106/80 | HR 90 | Temp 97.9°F | Ht 66.0 in | Wt 163.2 lb

## 2022-12-17 DIAGNOSIS — M545 Low back pain, unspecified: Secondary | ICD-10-CM

## 2022-12-17 MED ORDER — HYDROCODONE-ACETAMINOPHEN 5-325 MG PO TABS
1.0000 | ORAL_TABLET | Freq: Four times a day (QID) | ORAL | 0 refills | Status: DC | PRN
  Filled 2022-12-17: qty 20, 5d supply, fill #0

## 2022-12-17 MED ORDER — KETOROLAC TROMETHAMINE 60 MG/2ML IM SOLN
60.0000 mg | Freq: Once | INTRAMUSCULAR | Status: AC
Start: 2022-12-17 — End: 2022-12-17
  Administered 2022-12-17: 60 mg via INTRAMUSCULAR

## 2022-12-17 MED ORDER — FLUTICASONE PROPIONATE 50 MCG/ACT NA SUSP: 1.0000 | NASAL | Status: AC | PRN

## 2022-12-17 MED ORDER — CYCLOBENZAPRINE HCL 10 MG PO TABS
10.0000 mg | ORAL_TABLET | Freq: Every evening | ORAL | 0 refills | Status: AC | PRN
Start: 2022-12-17 — End: ?
  Filled 2022-12-17: qty 30, 30d supply, fill #0

## 2022-12-17 MED ORDER — DICLOFENAC SODIUM 75 MG PO TBEC
75.0000 mg | DELAYED_RELEASE_TABLET | Freq: Two times a day (BID) | ORAL | 0 refills | Status: AC
  Filled 2022-12-17: qty 30, 15d supply, fill #0

## 2022-12-17 NOTE — Progress Notes (Signed)
Subjective  CC:  Chief Complaint  Patient presents with   Back Pain    Pt stated that her back started hurting on 12/15/2022. Lower back pain with every movement but mostly when sitting down and then trying to get up    HPI: Rachel [name] is a 47 y.o. female who presents to the office today to address the problems listed above in the chief complaint. Pt reports had acute mid low back pain while bathing her son on Saturday evening.  No injury.  No significant straining.  Nothing unusual.  Since has had spasm and catching pain with most movements.  Was able to sleep after using some Xanax.  Took naproxen with some relief.  No radicular symptoms.  No bowel or bladder symptoms.  No recent history of significant back pain.  Assessment  1. Lumbosacral pain      Plan  Musculoskeletal lumbosacral pain: Strain most likely diagnosis.  Muscle spasm present.  Toradol in office given.  Start diclofenac tonight, hydrocodone for bedtime and Flexeril.  Heat.  Recheck in 48 hours.  Discussed red flag symptoms.  None present now  Follow up: 48 hours if needed 01/11/2023  No orders of the defined types were placed in this encounter.  Meds ordered this encounter  Medications   fluticasone (FLONASE) 50 MCG/ACT nasal spray    Sig: Place 1 spray into both nostrils as needed for allergies.   HYDROcodone-acetaminophen (NORCO) 5-325 MG tablet    Sig: Take 1 tablet by mouth every 6 (six) hours as needed for moderate pain.    Dispense:  20 tablet    Refill:  0   diclofenac (VOLTAREN) 75 MG EC tablet    Sig: Take 1 tablet (75 mg total) by mouth 2 (two) times daily.    Dispense:  30 tablet    Refill:  0   cyclobenzaprine (FLEXERIL) 10 MG tablet    Sig: Take 1 tablet (10 mg total) by mouth at bedtime as needed for muscle spasms.    Dispense:  30 tablet    Refill:  0   ketorolac (TORADOL) injection 60 mg      I reviewed the patients updated PMH, FH, and SocHx.    Patient Active Problem List    Diagnosis Date Noted   Myalgia due to statin 09/02/2020    Priority: High   Prediabetes 12/12/2016    Priority: High   Familial hyperlipidemia 08/17/2015    Priority: High   Perimenopausal 08/29/2022    Priority: Medium    Positive ANA (antinuclear antibody) 07/31/2022    Priority: Medium    Migraine without aura, not intractable, without status migrainosus 12/07/2020    Priority: Medium    GERD (gastroesophageal reflux disease) 08/10/2020    Priority: Medium    History of Helicobacter pylori infection 08/10/2020    Priority: Medium    History of peptic ulcer 08/10/2020    Priority: Medium    Adjustment reaction with anxiety 06/26/2017    Priority: Medium    Chronic migraine 12/28/2016    Priority: Medium    Insomnia 11/01/2015    Priority: Medium    Orthostatic hypotension 08/17/2015    Priority: Medium    Chronic constipation 01/02/2022    Priority: Low   TMJ (dislocation of temporomandibular joint) 01/10/2021    Priority: Low   Genetic testing 07/07/2022   Current Meds  Medication Sig   busPIRone (BUSPAR) 7.5 MG tablet Take 1-2 tablets (7.5-15 mg total) by mouth 2 (two) times  daily as needed.   cyclobenzaprine (FLEXERIL) 10 MG tablet Take 1 tablet (10 mg total) by mouth at bedtime as needed for muscle spasms.   diclofenac (VOLTAREN) 75 MG EC tablet Take 1 tablet (75 mg total) by mouth 2 (two) times daily.   escitalopram (LEXAPRO) 10 MG tablet Take 1 tablet (10 mg total) by mouth daily.   Evolocumab (REPATHA SURECLICK) 140 MG/ML SOAJ Inject 1 pen into the skin every 14 (fourteen) days.   glucose blood (FREESTYLE LITE) test strip Use once daily to check blood sugar R73.03   HYDROcodone-acetaminophen (NORCO) 5-325 MG tablet Take 1 tablet by mouth every 6 (six) hours as needed for moderate pain.   Multiple Vitamin (MULTIVITAMIN WITH MINERALS) TABS tablet Take 1 tablet by mouth daily.   rizatriptan (MAXALT-MLT) 10 MG disintegrating tablet Take 1 tablet (10 mg total) by mouth  as needed for migraine. May repeat in 2 hours if needed   rosuvastatin (CRESTOR) 5 MG tablet Take 1 tablet (5 mg total) by mouth daily.   [DISCONTINUED] fluticasone (FLONASE) 50 MCG/ACT nasal spray Place 1 spray into both nostrils 2 (two) times daily. (Patient taking differently: Place 1 spray into both nostrils as needed for allergies.)   [DISCONTINUED] methylPREDNISolone (MEDROL DOSEPAK) 4 MG TBPK tablet Take as directed on packaging.    Allergies: Patient has No Known Allergies. Family History: Patient family history includes Breast cancer (age of onset: 75) in her paternal aunt; Celiac disease in her sister; Diabetes in her sister; Heart disease in her sister; Hyperlipidemia in her daughter, mother, sister, and another family member; Irritable bowel syndrome in her sister; Spina bifida in her son. Social History:  Patient  reports that she has never smoked. She has never used smokeless tobacco. She reports that she does not drink alcohol and does not use drugs.  Review of Systems: Constitutional: Negative for fever malaise or anorexia Cardiovascular: negative for chest pain Respiratory: negative for SOB or persistent cough Gastrointestinal: negative for abdominal pain  Objective  Vitals: BP 106/80   Pulse 90   Temp 97.9 F (36.6 C)   Ht 5\' 6"  (1.676 m)   Wt 163 lb 3.2 oz (74 kg)   SpO2 96%   BMI 26.34 kg/m  General: Standing for comfort, A&Ox3 Back: Limited forward flexion.  Limited extension.  Fairly good lateral flexion bilaterally.  Has tenderness over lower lumbar and upper sacral spine.  Paravertebral muscles tender.  No gluteal notch tenderness, no hip tenderness.  Antalgic gait.  Commons side effects, risks, benefits, and alternatives for medications and treatment plan prescribed today were discussed, and the patient expressed understanding of the given instructions. Patient is instructed to call or message via MyChart if he/she has any questions or concerns regarding our  treatment plan. No barriers to understanding were identified. We discussed Red Flag symptoms and signs in detail. Patient expressed understanding regarding what to do in case of urgent or emergency type symptoms.  Medication list was reconciled, printed and provided to the patient in AVS. Patient instructions and summary information was reviewed with the patient as documented in the AVS. This note was prepared with assistance of Dragon voice recognition software. Occasional wrong-word or sound-a-like substitutions may have occurred due to the inherent limitations of voice recognition software

## 2023-01-11 ENCOUNTER — Encounter: Payer: Self-pay | Admitting: Family Medicine

## 2023-01-11 ENCOUNTER — Ambulatory Visit (INDEPENDENT_AMBULATORY_CARE_PROVIDER_SITE_OTHER): Payer: 59 | Admitting: Family Medicine

## 2023-01-11 VITALS — BP 102/62 | HR 90 | Temp 98.2°F | Ht 66.0 in | Wt 166.6 lb

## 2023-01-11 DIAGNOSIS — G43009 Migraine without aura, not intractable, without status migrainosus: Secondary | ICD-10-CM

## 2023-01-11 DIAGNOSIS — F4322 Adjustment disorder with anxiety: Secondary | ICD-10-CM | POA: Diagnosis not present

## 2023-01-11 DIAGNOSIS — M545 Low back pain, unspecified: Secondary | ICD-10-CM

## 2023-01-11 NOTE — Progress Notes (Signed)
Subjective  CC:  Chief Complaint  Patient presents with   Diabetes   Anxiety    Pt states that the Lexapro is helping some, since taking Lexapro, pt c/o of difficulty sleeping x1 month, has not tried any OTC meds for sleeping.     HPI: Rachel [name] is a 47 y.o. female who presents to the office today to address the problems listed above in the chief complaint, mood problems. On lexapro x 6 weeks: "helping" but affects sleep. Better able to manage stressors; less irritable and anxious. No other adverse effects. Took buspar x 3 w/ little effect. Is now thinking about starting counseling to learn some tools to manage life's stressors/circumstances.     01/11/2023   10:14 AM 12/17/2022    3:01 PM 08/29/2022    8:24 AM  Depression screen PHQ 2/9  Decreased Interest 0 0 0  Down, Depressed, Hopeless 1 0 0  PHQ - 2 Score 1 0 0  Altered sleeping 1    Tired, decreased energy 1    Change in appetite 1    Feeling bad or failure about yourself  0    Trouble concentrating 0    Moving slowly or fidgety/restless 0    Suicidal thoughts 0    PHQ-9 Score 4    Difficult doing work/chores Not difficult at all        01/11/2023   10:15 AM 12/17/2022    3:01 PM 11/30/2022    7:47 AM 08/29/2022    8:24 AM  GAD 7 : Generalized Anxiety Score  Nervous, Anxious, on Edge 0 0 1 0  Control/stop worrying 1 0 2 0  Worry too much - different things 1 0 2 0  Trouble relaxing 0 0 0 0  Restless 0 0 0 0  Easily annoyed or irritable 0 0 0 0  Afraid - awful might happen 0  1 0  Total GAD 7 Score 2  6 0  Anxiety Difficulty Not difficult at all Not difficult at all Somewhat difficult Not difficult at all    Had had a few migraines but manageable.  Back pain persists w/o new sxs. Ready for sports med eval.   Assessment  1. Adjustment reaction with anxiety   2. Migraine without aura, not intractable, without status migrainosus   3. Lumbosacral pain      Plan  Sress reaction/anxiety: improving. Try  taking lexapro in am instead of pm to see if sleep is less affected. Trial of buspar 15mg  bid x several days consecutively to see if helps manage acute anxiety sxs. Start counseling with Dr. Monna Fam. Migraines; monitor.  Refer to SM.  Follow up: feb for recheck sugars No orders of the defined types were placed in this encounter.  No orders of the defined types were placed in this encounter.     I reviewed the patients updated PMH, FH, and SocHx.    Patient Active Problem List   Diagnosis Date Noted   Myalgia due to statin 09/02/2020    Priority: High   Prediabetes 12/12/2016    Priority: High   Familial hyperlipidemia 08/17/2015    Priority: High   Perimenopausal 08/29/2022    Priority: Medium    Positive ANA (antinuclear antibody) 07/31/2022    Priority: Medium    Migraine without aura, not intractable, without status migrainosus 12/07/2020    Priority: Medium    GERD (gastroesophageal reflux disease) 08/10/2020    Priority: Medium    History of Helicobacter pylori infection  08/10/2020    Priority: Medium    History of peptic ulcer 08/10/2020    Priority: Medium    Adjustment reaction with anxiety 06/26/2017    Priority: Medium    Chronic migraine 12/28/2016    Priority: Medium    Insomnia 11/01/2015    Priority: Medium    Orthostatic hypotension 08/17/2015    Priority: Medium    Chronic constipation 01/02/2022    Priority: Low   TMJ (dislocation of temporomandibular joint) 01/10/2021    Priority: Low   Genetic testing 07/07/2022   Current Meds  Medication Sig   busPIRone (BUSPAR) 7.5 MG tablet Take 1-2 tablets (7.5-15 mg total) by mouth 2 (two) times daily as needed.   cyclobenzaprine (FLEXERIL) 10 MG tablet Take 1 tablet (10 mg total) by mouth at bedtime as needed for muscle spasms.   diclofenac (VOLTAREN) 75 MG EC tablet Take 1 tablet (75 mg total) by mouth 2 (two) times daily.   escitalopram (LEXAPRO) 10 MG tablet Take 1 tablet (10 mg total) by mouth daily.    Evolocumab (REPATHA SURECLICK) 140 MG/ML SOAJ Inject 1 pen into the skin every 14 (fourteen) days.   fluticasone (FLONASE) 50 MCG/ACT nasal spray Place 1 spray into both nostrils as needed for allergies.   glucose blood (FREESTYLE LITE) test strip Use once daily to check blood sugar R73.03   HYDROcodone-acetaminophen (NORCO) 5-325 MG tablet Take 1 tablet by mouth every 6 (six) hours as needed for moderate pain.   Multiple Vitamin (MULTIVITAMIN WITH MINERALS) TABS tablet Take 1 tablet by mouth daily.   rizatriptan (MAXALT-MLT) 10 MG disintegrating tablet Take 1 tablet (10 mg total) by mouth as needed for migraine. May repeat in 2 hours if needed   rosuvastatin (CRESTOR) 5 MG tablet Take 1 tablet (5 mg total) by mouth daily.    Allergies: Patient has No Known Allergies. Family history:  Patient family history includes Breast cancer (age of onset: 18) in her paternal aunt; Celiac disease in her sister; Diabetes in her sister; Heart disease in her sister; Hyperlipidemia in her daughter, mother, sister, and another family member; Irritable bowel syndrome in her sister; Spina bifida in her son. Social History   Socioeconomic History   Marital status: Married    Spouse name: Not on file   Number of children: 2   Years of education: Not on file   Highest education level: Bachelor's degree (e.g., BA, AB, BS)  Occupational History   Occupation: Chartered certified accountant: [employer]  Tobacco Use   Smoking status: Never   Smokeless tobacco: Never  Vaping Use   Vaping status: Never Used  Substance and Sexual Activity   Alcohol use: No   Drug use: No   Sexual activity: Yes    Birth control/protection: Condom  Other Topics Concern   Not on file  Social History Narrative   Lives with her husband and their 2 children   Her son is wheelchair bound due to spina bifida   Social Determinants of Health   Financial Resource Strain: Low Risk  (01/09/2023)   Overall Financial Resource  Strain (CARDIA)    Difficulty of Paying Living Expenses: Not very hard  Food Insecurity: No Food Insecurity (01/09/2023)   Hunger Vital Sign    Worried About Running Out of Food in the Last Year: Never true    Ran Out of Food in the Last Year: Never true  Transportation Needs: No Transportation Needs (01/09/2023)   PRAPARE - Transportation  Lack of Transportation (Medical): No    Lack of Transportation (Non-Medical): No  Physical Activity: Insufficiently Active (01/09/2023)   Exercise Vital Sign    Days of Exercise per Week: 3 days    Minutes of Exercise per Session: 40 min  Stress: Stress Concern Present (01/09/2023)   [employer] of Occupational Health - Occupational Stress Questionnaire    Feeling of Stress : To some extent  Social Connections: Socially Integrated (01/09/2023)   Social Connection and Isolation Panel [NHANES]    Frequency of Communication with Friends and Family: More than three times a week    Frequency of Social Gatherings with Friends and Family: Once a week    Attends Religious Services: More than 4 times per year    Active Member of [employer] or Organizations: Yes    Attends Engineer, structural: More than 4 times per year    Marital Status: Married     Review of Systems: Constitutional: Negative for fever malaise or anorexia Cardiovascular: negative for chest pain Respiratory: negative for SOB or persistent cough Gastrointestinal: negative for abdominal pain  Objective  Vitals: BP 102/62   Pulse 90   Temp 98.2 F (36.8 C)   Ht 5\' 6"  (1.676 m)   Wt 166 lb 9.6 oz (75.6 kg)   SpO2 98%   BMI 26.89 kg/m  General: no acute distress, well appearing, no apparent distress, well groomed Psych:  Alert and oriented x 3,normal mood, behavior, speech, dress, and thought processes.    Commons side effects, risks, benefits, and alternatives for medications and treatment plan prescribed today were discussed, and the patient expressed understanding  of the given instructions. Patient is instructed to call or message via MyChart if he/she has any questions or concerns regarding our treatment plan. No barriers to understanding were identified. We discussed Red Flag symptoms and signs in detail. Patient expressed understanding regarding what to do in case of urgent or emergency type symptoms.  Medication list was reconciled, printed and provided to the patient in AVS. Patient instructions and summary information was reviewed with the patient as documented in the AVS. This note was prepared with assistance of Dragon voice recognition software. Occasional wrong-word or sound-a-like substitutions may have occurred due to the inherent limitations of voice recognition software

## 2023-01-21 ENCOUNTER — Ambulatory Visit (INDEPENDENT_AMBULATORY_CARE_PROVIDER_SITE_OTHER): Payer: 59 | Admitting: Family Medicine

## 2023-01-21 ENCOUNTER — Encounter: Payer: Self-pay | Admitting: Family Medicine

## 2023-01-21 ENCOUNTER — Other Ambulatory Visit (HOSPITAL_BASED_OUTPATIENT_CLINIC_OR_DEPARTMENT_OTHER): Payer: Self-pay

## 2023-01-21 VITALS — BP 104/78 | HR 88 | Temp 98.1°F | Ht 66.0 in | Wt 166.2 lb

## 2023-01-21 DIAGNOSIS — H1033 Unspecified acute conjunctivitis, bilateral: Secondary | ICD-10-CM

## 2023-01-21 DIAGNOSIS — F4322 Adjustment disorder with anxiety: Secondary | ICD-10-CM

## 2023-01-21 MED ORDER — ERYTHROMYCIN 5 MG/GM OP OINT
1.0000 | TOPICAL_OINTMENT | Freq: Three times a day (TID) | OPHTHALMIC | 0 refills | Status: DC
  Filled 2023-01-21: qty 3.5, 7d supply, fill #0

## 2023-01-21 NOTE — Progress Notes (Signed)
Subjective  CC:  Chief Complaint  Patient presents with   eye swelling    Pt stated that she noticed that both eyes were swelling but the Rt eye is wworse. Very itchy with a little bit of pain. Just did a warm compressor to help open the eye    HPI: Rachel [name] is a 47 y.o. female who presents to the office today to address the problems listed above in the chief complaint. Bilateral itching red draining eyes: awoke with left eye matted shut this am. Some puffiness below right eye.   No cough or cold sxs. No f/c/s or vision disturbance. Eyes are irritated but not painful. No true photophobia. F/u last week's visit: buspar w/o any effects. Remains on lexapro and will get with counselor.  Assessment  1. Acute bacterial conjunctivitis of both eyes      Plan  Pink eye:  emycin ointment tid and supportive care. To f/u if worsening D/c buspar. Defers other anxiety meds at this time. Feels lexapro is helping.  Follow up: prn 02/22/2023  No orders of the defined types were placed in this encounter.  Meds ordered this encounter  Medications   erythromycin ophthalmic ointment    Sig: Place 1 Application into both eyes 3 (three) times daily.    Dispense:  3.5 g    Refill:  0      I reviewed the patients updated PMH, FH, and SocHx.    Patient Active Problem List   Diagnosis Date Noted   Myalgia due to statin 09/02/2020    Priority: High   Prediabetes 12/12/2016    Priority: High   Familial hyperlipidemia 08/17/2015    Priority: High   Perimenopausal 08/29/2022    Priority: Medium    Positive ANA (antinuclear antibody) 07/31/2022    Priority: Medium    Migraine without aura, not intractable, without status migrainosus 12/07/2020    Priority: Medium    GERD (gastroesophageal reflux disease) 08/10/2020    Priority: Medium    History of Helicobacter pylori infection 08/10/2020    Priority: Medium    History of peptic ulcer 08/10/2020    Priority: Medium    Adjustment  reaction with anxiety 06/26/2017    Priority: Medium    Chronic migraine 12/28/2016    Priority: Medium    Insomnia 11/01/2015    Priority: Medium    Orthostatic hypotension 08/17/2015    Priority: Medium    Chronic constipation 01/02/2022    Priority: Low   TMJ (dislocation of temporomandibular joint) 01/10/2021    Priority: Low   Genetic testing 07/07/2022   Current Meds  Medication Sig   busPIRone (BUSPAR) 7.5 MG tablet Take 1-2 tablets (7.5-15 mg total) by mouth 2 (two) times daily as needed.   cyclobenzaprine (FLEXERIL) 10 MG tablet Take 1 tablet (10 mg total) by mouth at bedtime as needed for muscle spasms.   diclofenac (VOLTAREN) 75 MG EC tablet Take 1 tablet (75 mg total) by mouth 2 (two) times daily.   erythromycin ophthalmic ointment Place 1 Application into both eyes 3 (three) times daily.   escitalopram (LEXAPRO) 10 MG tablet Take 1 tablet (10 mg total) by mouth daily.   Evolocumab (REPATHA SURECLICK) 140 MG/ML SOAJ Inject 1 pen into the skin every 14 (fourteen) days.   fluticasone (FLONASE) 50 MCG/ACT nasal spray Place 1 spray into both nostrils as needed for allergies.   glucose blood (FREESTYLE LITE) test strip Use once daily to check blood sugar R73.03  HYDROcodone-acetaminophen (NORCO) 5-325 MG tablet Take 1 tablet by mouth every 6 (six) hours as needed for moderate pain.   Multiple Vitamin (MULTIVITAMIN WITH MINERALS) TABS tablet Take 1 tablet by mouth daily.   rizatriptan (MAXALT-MLT) 10 MG disintegrating tablet Take 1 tablet (10 mg total) by mouth as needed for migraine. May repeat in 2 hours if needed   rosuvastatin (CRESTOR) 5 MG tablet Take 1 tablet (5 mg total) by mouth daily.    Allergies: Patient has No Known Allergies. Family History: Patient family history includes Breast cancer (age of onset: 83) in her paternal aunt; Celiac disease in her sister; Diabetes in her sister; Heart disease in her sister; Hyperlipidemia in her daughter, mother, sister, and  another family member; Irritable bowel syndrome in her sister; Spina bifida in her son. Social History:  Patient  reports that she has never smoked. She has never used smokeless tobacco. She reports that she does not drink alcohol and does not use drugs.  Review of Systems: Constitutional: Negative for fever malaise or anorexia Cardiovascular: negative for chest pain Respiratory: negative for SOB or persistent cough Gastrointestinal: negative for abdominal pain  Objective  Vitals: BP 104/78   Pulse 88   Temp 98.1 F (36.7 C)   Ht 5\' 6"  (1.676 m)   Wt 166 lb 3.2 oz (75.4 kg)   SpO2 98%   BMI 26.83 kg/m  General: no acute distress , A&Ox3 HEENT: PEERLA, conjunctiva bilateral injection, near limbus on right, no matting/drainage now. No lid edema, neck is supple  Commons side effects, risks, benefits, and alternatives for medications and treatment plan prescribed today were discussed, and the patient expressed understanding of the given instructions. Patient is instructed to call or message via MyChart if he/she has any questions or concerns regarding our treatment plan. No barriers to understanding were identified. We discussed Red Flag symptoms and signs in detail. Patient expressed understanding regarding what to do in case of urgent or emergency type symptoms.  Medication list was reconciled, printed and provided to the patient in AVS. Patient instructions and summary information was reviewed with the patient as documented in the AVS. This note was prepared with assistance of Dragon voice recognition software. Occasional wrong-word or sound-a-like substitutions may have occurred due to the inherent limitations of voice recognition software

## 2023-02-05 ENCOUNTER — Other Ambulatory Visit (HOSPITAL_BASED_OUTPATIENT_CLINIC_OR_DEPARTMENT_OTHER): Payer: Self-pay

## 2023-02-05 ENCOUNTER — Other Ambulatory Visit: Payer: Self-pay | Admitting: Physician Assistant

## 2023-02-05 ENCOUNTER — Other Ambulatory Visit: Payer: Self-pay | Admitting: Internal Medicine

## 2023-02-05 ENCOUNTER — Ambulatory Visit (INDEPENDENT_AMBULATORY_CARE_PROVIDER_SITE_OTHER): Payer: 59 | Admitting: Family Medicine

## 2023-02-05 ENCOUNTER — Encounter: Payer: Self-pay | Admitting: Family Medicine

## 2023-02-05 VITALS — Temp 98.3°F | Ht 66.0 in | Wt 166.2 lb

## 2023-02-05 DIAGNOSIS — R7303 Prediabetes: Secondary | ICD-10-CM

## 2023-02-05 DIAGNOSIS — E7801 Familial hypercholesterolemia: Secondary | ICD-10-CM

## 2023-02-05 DIAGNOSIS — E7849 Other hyperlipidemia: Secondary | ICD-10-CM

## 2023-02-05 DIAGNOSIS — F4322 Adjustment disorder with anxiety: Secondary | ICD-10-CM

## 2023-02-05 DIAGNOSIS — F5104 Psychophysiologic insomnia: Secondary | ICD-10-CM

## 2023-02-05 DIAGNOSIS — E1169 Type 2 diabetes mellitus with other specified complication: Secondary | ICD-10-CM

## 2023-02-05 LAB — LIPID PANEL
Cholesterol: 345 mg/dL — ABNORMAL HIGH (ref 0–200)
HDL: 49.9 mg/dL (ref >39.00)
LDL Cholesterol: 265 mg/dL — ABNORMAL HIGH (ref 0–99)
NonHDL: 294.9
Total CHOL/HDL Ratio: 7
Triglycerides: 151 mg/dL — ABNORMAL HIGH (ref 0.0–149.0)
VLDL: 30.2 mg/dL (ref 0.0–40.0)

## 2023-02-05 MED ORDER — FREESTYLE LANCETS MISC
12 refills | Status: DC
  Filled 2023-02-05: qty 100, 30d supply, fill #0

## 2023-02-05 MED ORDER — ACCU-CHEK AVIVA PLUS VI STRP
ORAL_STRIP | 12 refills | Status: DC
  Filled 2023-02-05: qty 100, 30d supply, fill #0

## 2023-02-05 MED ORDER — TRAZODONE HCL 50 MG PO TABS
25.0000 mg | ORAL_TABLET | Freq: Every evening | ORAL | 3 refills | Status: AC | PRN
Start: 2023-02-05 — End: ?
  Filled 2023-02-05: qty 30, 30d supply, fill #0
  Filled 2023-03-06: qty 30, 30d supply, fill #1
  Filled 2023-08-31: qty 30, 30d supply, fill #2

## 2023-02-05 MED ORDER — ESCITALOPRAM OXALATE 10 MG PO TABS: 10.0000 mg | ORAL_TABLET | Freq: Every day | ORAL | Status: DC

## 2023-02-05 MED ORDER — BLOOD GLUCOSE MONITOR SYSTEM W/DEVICE KIT
PACK | 0 refills | Status: DC
  Filled 2023-02-05: qty 1, 1d supply, fill #0

## 2023-02-05 NOTE — Progress Notes (Signed)
Subjective  CC:  Chief Complaint  Patient presents with   Insomnia    Pt has not been sleeping on/off for quite some time now. Getting on a good night 5/7 hrs of sleep. Most nights are shorter    Nausea    Pt stated that she feels that she has been having some nausea from lexapro for the past 2 weeks and it has gotten worse along with no appetite.    HPI: Rachel [name] is a 47 y.o. female who presents to the office today to address the problems listed above in the chief complaint. Discussed the use of AI scribe software for clinical note transcription with the patient, who gave verbal consent to proceed.  History of Present Illness   The patient, with a history of chronic anxiety, presents with concerns about her sleep pattern and gastrointestinal symptoms. She reports a change in her sleep pattern since she has had the opportunity to sleep at night, which is a significant change from her previous routine of intermittent sleep. Despite having the opportunity to sleep, she reports difficulty falling asleep and a lack of deep sleep, describing it as if her brain is still active. She also reports vivid dreams that feel very real, contributing to her feeling of unrest.  The patient has been on Lexapro since September, which was prescribed for her chronic anxiety. Since switching the medication to daytime, she reports decreased appetite and a metallic taste in her mouth, leading to nausea throughout the day. She denies any reflux or heartburn. These symptoms were not present when she was taking the medication at night.  In addition to these concerns, the patient reports episodes of feeling shaky approximately once a week, which she suspects may be due to low blood sugar. She reports this happens when she skips meals. Despite these symptoms, she reports that her weight has remained stable.  The patient has a history of difficulty tolerating medications and expresses concern about potential side  effects of new medications. However, she acknowledges the need for medication to manage her symptoms and is open to trying new treatments. She has previously tried Trazodone for sleep and did not report any negative effects. She also mentions a positive response to muscle relaxers, which helped her sleep, but left her feeling groggy in the morning.      Assessment  1. Psychophysiological insomnia   2. Familial hyperlipidemia   3. Prediabetes   4. Adjustment reaction with anxiety      Plan  Assessment and Plan    Insomnia Chronic insomnia for over two decades, worsened in the last two months. Reports inability to achieve deep sleep, frequent tossing, and constant brain activity. Lexapro not implicated. Discussed sleep retraining and trazodone as a non-addictive sleep aid. Informed consent provided regarding trazodone's benefits and low risk of morning grogginess. Alternative strategies such as sleep meditations and relaxation techniques discussed. - Prescribe trazodone - Recommend sleep meditations, sleep sounds, and relaxation techniques - Switch Lexapro to nighttime dosing  Appetite Changes Decreased appetite and nausea since switching Lexapro to daytime dosing. No reflux or heartburn. Symptoms similar to those with Mounjaro. Plan to switch Lexapro back to nighttime dosing to mitigate GI symptoms. Informed consent provided regarding benefits of reduced nausea and improved GI tolerance. - Switch Lexapro to nighttime dosing - Take Lexapro with a small snack if nausea occurs  Anxiety Generalized anxiety with improvement on Lexapro, including reduced irritability and ability to drive on the highway. Still experiences stress related to  son's issues. Lexapro beneficial in managing anxiety symptoms. Informed consent provided regarding continued use of Lexapro and potential need for dose adjustment if side effects persist. - Continue Lexapro - Monitor for side effects or need for dose  adjustment    Follow-up - Follow up as needed based on response to trazodone and Lexapro adjustments.        Follow up: prn 02/22/2023  No orders of the defined types were placed in this encounter.  Meds ordered this encounter  Medications   DISCONTD: Lancets (FREESTYLE) lancets    Sig: Use as directed    Dispense:  100 each    Refill:  12   DISCONTD: glucose blood (ACCU-CHEK AVIVA PLUS) test strip    Sig: Use as instructed    Dispense:  100 each    Refill:  12   DISCONTD: Blood Glucose Monitoring Suppl (BLOOD GLUCOSE MONITOR SYSTEM) w/Device KIT    Sig: Use as directed    Dispense:  1 kit    Refill:  0   traZODone (DESYREL) 50 MG tablet    Sig: Take 0.5-1 tablets (25-50 mg total) by mouth at bedtime as needed for sleep.    Dispense:  30 tablet    Refill:  3   escitalopram (LEXAPRO) 10 MG tablet    Sig: Take 1 tablet (10 mg total) by mouth at bedtime.      I reviewed the patients updated PMH, FH, and SocHx.    Patient Active Problem List   Diagnosis Date Noted   Myalgia due to statin 09/02/2020    Priority: High   Prediabetes 12/12/2016    Priority: High   Familial hyperlipidemia 08/17/2015    Priority: High   Perimenopausal 08/29/2022    Priority: Medium    Positive ANA (antinuclear antibody) 07/31/2022    Priority: Medium    Migraine without aura, not intractable, without status migrainosus 12/07/2020    Priority: Medium    GERD (gastroesophageal reflux disease) 08/10/2020    Priority: Medium    History of Helicobacter pylori infection 08/10/2020    Priority: Medium    History of peptic ulcer 08/10/2020    Priority: Medium    Adjustment reaction with anxiety 06/26/2017    Priority: Medium    Chronic migraine 12/28/2016    Priority: Medium    Insomnia 11/01/2015    Priority: Medium    Orthostatic hypotension 08/17/2015    Priority: Medium    Chronic constipation 01/02/2022    Priority: Low   TMJ (dislocation of temporomandibular joint) 01/10/2021     Priority: Low   Genetic testing 07/07/2022   Current Meds  Medication Sig   cyclobenzaprine (FLEXERIL) 10 MG tablet Take 1 tablet (10 mg total) by mouth at bedtime as needed for muscle spasms.   diclofenac (VOLTAREN) 75 MG EC tablet Take 1 tablet (75 mg total) by mouth 2 (two) times daily.   Evolocumab (REPATHA SURECLICK) 140 MG/ML SOAJ Inject 1 pen into the skin every 14 (fourteen) days.   fluticasone (FLONASE) 50 MCG/ACT nasal spray Place 1 spray into both nostrils as needed for allergies.   Multiple Vitamin (MULTIVITAMIN WITH MINERALS) TABS tablet Take 1 tablet by mouth daily.   rizatriptan (MAXALT-MLT) 10 MG disintegrating tablet Take 1 tablet (10 mg total) by mouth as needed for migraine. May repeat in 2 hours if needed   rosuvastatin (CRESTOR) 5 MG tablet Take 1 tablet (5 mg total) by mouth daily.   traZODone (DESYREL) 50 MG tablet Take 0.5-1 tablets (25-50  mg total) by mouth at bedtime as needed for sleep.   [DISCONTINUED] Blood Glucose Monitoring Suppl (BLOOD GLUCOSE MONITOR SYSTEM) w/Device KIT Use as directed   [DISCONTINUED] escitalopram (LEXAPRO) 10 MG tablet Take 1 tablet (10 mg total) by mouth daily.   [DISCONTINUED] glucose blood (ACCU-CHEK AVIVA PLUS) test strip Use as instructed   [DISCONTINUED] HYDROcodone-acetaminophen (NORCO) 5-325 MG tablet Take 1 tablet by mouth every 6 (six) hours as needed for moderate pain.   [DISCONTINUED] Lancets (FREESTYLE) lancets Use as directed   [DISCONTINUED] medroxyPROGESTERone (PROVERA) 10 MG tablet Take 1 tablet (10 mg total) by mouth daily for 10 days.    Allergies: Patient has No Known Allergies. Family History: Patient family history includes Breast cancer (age of onset: 50) in her paternal aunt; Celiac disease in her sister; Diabetes in her sister; Heart disease in her sister; Hyperlipidemia in her daughter, mother, sister, and another family member; Irritable bowel syndrome in her sister; Spina bifida in her son. Social History:   Patient  reports that she has never smoked. She has never used smokeless tobacco. She reports that she does not drink alcohol and does not use drugs.  Review of Systems: Constitutional: Negative for fever malaise or anorexia Cardiovascular: negative for chest pain Respiratory: negative for SOB or persistent cough Gastrointestinal: negative for abdominal pain  Objective  Vitals: Temp 98.3 F (36.8 C)   Ht 5\' 6"  (1.676 m)   Wt 166 lb 3.2 oz (75.4 kg)   BMI 26.83 kg/m  General: no acute distress , A&Ox3   Commons side effects, risks, benefits, and alternatives for medications and treatment plan prescribed today were discussed, and the patient expressed understanding of the given instructions. Patient is instructed to call or message via MyChart if he/she has any questions or concerns regarding our treatment plan. No barriers to understanding were identified. We discussed Red Flag symptoms and signs in detail. Patient expressed understanding regarding what to do in case of urgent or emergency type symptoms.  Medication list was reconciled, printed and provided to the patient in AVS. Patient instructions and summary information was reviewed with the patient as documented in the AVS. This note was prepared with assistance of Dragon voice recognition software. Occasional wrong-word or sound-a-like substitutions may have occurred due to the inherent limitations of voice recognition software

## 2023-02-05 NOTE — Patient Instructions (Signed)
Please follow up if symptoms do not improve or as needed.    History of Present Illness The patient, with a history of chronic anxiety, presents with concerns about her sleep pattern and gastrointestinal symptoms. She reports a change in her sleep pattern since she has had the opportunity to sleep at night, which is a significant change from her previous routine of intermittent sleep. Despite having the opportunity to sleep, she reports difficulty falling asleep and a lack of deep sleep, describing it as if her brain is still active. She also reports vivid dreams that feel very real, contributing to her feeling of unrest.  The patient has been on Lexapro since September, which was prescribed for her chronic anxiety. Since switching the medication to daytime, she reports decreased appetite and a metallic taste in her mouth, leading to nausea throughout the day. She denies any reflux or heartburn. These symptoms were not present when she was taking the medication at night.  In addition to these concerns, the patient reports episodes of feeling shaky approximately once a week, which she suspects may be due to low blood sugar. She denies any correlation with food intake. Despite these symptoms, she reports that her weight has remained stable.  The patient also mentions a history of constipation, which has improved recently. She now reports regular bowel movements, a significant change from her previous pattern. She has also started counseling, which she finds helpful.  The patient has a history of difficulty tolerating medications and expresses concern about potential side effects of new medications. However, she acknowledges the need for medication to manage her symptoms and is open to trying new treatments. She has previously tried Trazodone for sleep and did not report any negative effects. She also mentions a positive response to muscle relaxers, which helped her sleep, but left her feeling groggy in the  morning.  Assessment & Plan Insomnia Chronic insomnia for over two decades, worsened in the last two months. Reports inability to achieve deep sleep, frequent tossing, and constant brain activity. Lexapro not implicated. Discussed sleep retraining and trazodone as a non-addictive sleep aid. Informed consent provided regarding trazodone's benefits and low risk of morning grogginess. Alternative strategies such as sleep meditations and relaxation techniques discussed. - Prescribe trazodone - Recommend sleep meditations, sleep sounds, and relaxation techniques - Switch Lexapro to nighttime dosing  Appetite Changes Decreased appetite and nausea since switching Lexapro to daytime dosing. No reflux or heartburn. Symptoms similar to those with Mounjaro. Plan to switch Lexapro back to nighttime dosing to mitigate GI symptoms. Informed consent provided regarding benefits of reduced nausea and improved GI tolerance. - Switch Lexapro to nighttime dosing - Take Lexapro with a small snack if nausea occurs  Anxiety Generalized anxiety with improvement on Lexapro, including reduced irritability and ability to drive on the highway. Still experiences stress related to son's issues. Lexapro beneficial in managing anxiety symptoms. Informed consent provided regarding continued use of Lexapro and potential need for dose adjustment if side effects persist. - Continue Lexapro - Monitor for side effects or need for dose adjustment    Follow-up - Follow up as needed based on response to trazodone and Lexapro adjustments.

## 2023-02-06 ENCOUNTER — Other Ambulatory Visit (HOSPITAL_BASED_OUTPATIENT_CLINIC_OR_DEPARTMENT_OTHER): Payer: Self-pay

## 2023-02-06 MED ORDER — ROSUVASTATIN CALCIUM 5 MG PO TABS
5.0000 mg | ORAL_TABLET | Freq: Every day | ORAL | 0 refills | Status: DC
  Filled 2023-02-06: qty 90, 90d supply, fill #0

## 2023-02-06 MED ORDER — REPATHA SURECLICK 140 MG/ML ~~LOC~~ SOAJ
SUBCUTANEOUS | 11 refills | Status: DC
  Filled 2023-02-06: qty 2, 28d supply, fill #0
  Filled 2023-03-06: qty 2, 28d supply, fill #1

## 2023-02-07 ENCOUNTER — Other Ambulatory Visit (HOSPITAL_BASED_OUTPATIENT_CLINIC_OR_DEPARTMENT_OTHER): Payer: Self-pay

## 2023-02-07 NOTE — Progress Notes (Signed)
See mychart note Patient reports she has not been taking her lisinopril consistently.

## 2023-02-22 ENCOUNTER — Ambulatory Visit (INDEPENDENT_AMBULATORY_CARE_PROVIDER_SITE_OTHER): Payer: 59 | Admitting: Family Medicine

## 2023-02-22 ENCOUNTER — Encounter: Payer: Self-pay | Admitting: Family Medicine

## 2023-02-22 VITALS — BP 112/84 | HR 73 | Temp 98.4°F | Ht 66.0 in | Wt 166.2 lb

## 2023-02-22 DIAGNOSIS — E7849 Other hyperlipidemia: Secondary | ICD-10-CM

## 2023-02-22 DIAGNOSIS — T887XXA Unspecified adverse effect of drug or medicament, initial encounter: Secondary | ICD-10-CM | POA: Diagnosis not present

## 2023-02-22 DIAGNOSIS — F4322 Adjustment disorder with anxiety: Secondary | ICD-10-CM

## 2023-02-22 DIAGNOSIS — F5104 Psychophysiologic insomnia: Secondary | ICD-10-CM | POA: Diagnosis not present

## 2023-02-22 DIAGNOSIS — T466X5A Adverse effect of antihyperlipidemic and antiarteriosclerotic drugs, initial encounter: Secondary | ICD-10-CM | POA: Diagnosis not present

## 2023-02-22 DIAGNOSIS — M791 Myalgia, unspecified site: Secondary | ICD-10-CM | POA: Diagnosis not present

## 2023-02-22 NOTE — Progress Notes (Signed)
Subjective  CC:  Chief Complaint  Patient presents with   Psychophysiological insomnia    Pt stated that the medication seems to be helping and she is getting about 7/8 hrs of sleep. Lexapro has been switched to nighttime. She is not able to eat first thing in the morning     HPI: Rachel [name] is a 47 y.o. female who presents to the office today to address the problems listed above in the chief complaint. F/u insomnia: doing very well now on trazadone. Has good response w/o side effects. Not even needing nightly. She is happy with this Anxiety: has noted continued improvement with lexapro 10; decreased irritabilty. Still with nausea but improved with at bedtime dosing. No vomiting or abdominal pain. Reports se is tolerable. Working with counselor on managing work stressors.  HLD; restarted repatha. Prefers to remain off crestor due to myalgias and not f/u with lipid clinic. Diet is clean   Assessment  1. Psychophysiological insomnia   2. Adjustment reaction with anxiety   3. Medication side effect   4. Familial hyperlipidemia   5. Myalgia due to statin      Plan  Insomnia :   much improved. Continue trazadone.  Anxiety: continue lexapro 10 daily. Doing better. Continue counseling.  HLD: continue repatha and will recheck next visit. No statin for now.   Follow up: 3 mo for cm and hld f/u Visit date not found  No orders of the defined types were placed in this encounter.  No orders of the defined types were placed in this encounter.     I reviewed the patients updated PMH, FH, and SocHx.    Patient Active Problem List   Diagnosis Date Noted   Myalgia due to statin 09/02/2020    Priority: High   Prediabetes 12/12/2016    Priority: High   Familial hyperlipidemia 08/17/2015    Priority: High   Perimenopausal 08/29/2022    Priority: Medium    Positive ANA (antinuclear antibody) 07/31/2022    Priority: Medium    Migraine without aura, not intractable, without status  migrainosus 12/07/2020    Priority: Medium    GERD (gastroesophageal reflux disease) 08/10/2020    Priority: Medium    History of Helicobacter pylori infection 08/10/2020    Priority: Medium    History of peptic ulcer 08/10/2020    Priority: Medium    Adjustment reaction with anxiety 06/26/2017    Priority: Medium    Chronic migraine 12/28/2016    Priority: Medium    Insomnia 11/01/2015    Priority: Medium    Orthostatic hypotension 08/17/2015    Priority: Medium    Chronic constipation 01/02/2022    Priority: Low   TMJ (dislocation of temporomandibular joint) 01/10/2021    Priority: Low   Genetic testing 07/07/2022   Current Meds  Medication Sig   cyclobenzaprine (FLEXERIL) 10 MG tablet Take 1 tablet (10 mg total) by mouth at bedtime as needed for muscle spasms.   diclofenac (VOLTAREN) 75 MG EC tablet Take 1 tablet (75 mg total) by mouth 2 (two) times daily.   escitalopram (LEXAPRO) 10 MG tablet Take 1 tablet (10 mg total) by mouth at bedtime.   Evolocumab (REPATHA SURECLICK) 140 MG/ML SOAJ Inject 1 pen into the skin every 14 (fourteen) days.   fluticasone (FLONASE) 50 MCG/ACT nasal spray Place 1 spray into both nostrils as needed for allergies.   Multiple Vitamin (MULTIVITAMIN WITH MINERALS) TABS tablet Take 1 tablet by mouth daily.   rizatriptan (MAXALT-MLT)  10 MG disintegrating tablet Take 1 tablet (10 mg total) by mouth as needed for migraine. May repeat in 2 hours if needed   rosuvastatin (CRESTOR) 5 MG tablet Take 1 tablet (5 mg total) by mouth daily.   traZODone (DESYREL) 50 MG tablet Take 0.5-1 tablets (25-50 mg total) by mouth at bedtime as needed for sleep.    Allergies: Patient has No Known Allergies. Family History: Patient family history includes Breast cancer (age of onset: 28) in her paternal aunt; Celiac disease in her sister; Diabetes in her sister; Heart disease in her sister; Hyperlipidemia in her daughter, mother, sister, and another family member; Irritable  bowel syndrome in her sister; Spina bifida in her son. Social History:  Patient  reports that she has never smoked. She has never used smokeless tobacco. She reports that she does not drink alcohol and does not use drugs.  Review of Systems: Constitutional: Negative for fever malaise or anorexia Cardiovascular: negative for chest pain Respiratory: negative for SOB or persistent cough Gastrointestinal: negative for abdominal pain  Objective  Vitals: BP 112/84   Pulse 73   Temp 98.4 F (36.9 C)   Ht 5\' 6"  (1.676 m)   Wt 166 lb 3.2 oz (75.4 kg)   SpO2 99%   BMI 26.83 kg/m  General: no acute distress , A&Ox3   Commons side effects, risks, benefits, and alternatives for medications and treatment plan prescribed today were discussed, and the patient expressed understanding of the given instructions. Patient is instructed to call or message via MyChart if he/she has any questions or concerns regarding our treatment plan. No barriers to understanding were identified. We discussed Red Flag symptoms and signs in detail. Patient expressed understanding regarding what to do in case of urgent or emergency type symptoms.  Medication list was reconciled, printed and provided to the patient in AVS. Patient instructions and summary information was reviewed with the patient as documented in the AVS. This note was prepared with assistance of Dragon voice recognition software. Occasional wrong-word or sound-a-like substitutions may have occurred due to the inherent limitations of voice recognition software

## 2023-02-26 ENCOUNTER — Other Ambulatory Visit: Payer: Self-pay | Admitting: Family Medicine

## 2023-02-26 ENCOUNTER — Other Ambulatory Visit (HOSPITAL_BASED_OUTPATIENT_CLINIC_OR_DEPARTMENT_OTHER): Payer: Self-pay

## 2023-02-26 MED ORDER — ESCITALOPRAM OXALATE 10 MG PO TABS: 10.0000 mg | ORAL_TABLET | Freq: Every day | ORAL | Status: DC

## 2023-02-27 ENCOUNTER — Other Ambulatory Visit: Payer: Self-pay | Admitting: Family Medicine

## 2023-02-27 ENCOUNTER — Other Ambulatory Visit (HOSPITAL_BASED_OUTPATIENT_CLINIC_OR_DEPARTMENT_OTHER): Payer: Self-pay

## 2023-02-27 ENCOUNTER — Other Ambulatory Visit: Payer: Self-pay

## 2023-02-27 MED ORDER — ESCITALOPRAM OXALATE 10 MG PO TABS: 10.0000 mg | ORAL_TABLET | Freq: Every day | ORAL | Status: DC

## 2023-02-27 MED ORDER — ESCITALOPRAM OXALATE 10 MG PO TABS
10.0000 mg | ORAL_TABLET | Freq: Every day | ORAL | 3 refills | Status: DC
  Filled 2023-02-27: qty 90, 90d supply, fill #0

## 2023-05-24 ENCOUNTER — Ambulatory Visit: Payer: 59 | Admitting: Family Medicine

## 2023-05-24 ENCOUNTER — Encounter: Payer: Self-pay | Admitting: Family Medicine

## 2023-05-24 VITALS — BP 108/70 | HR 78 | Temp 98.3°F | Ht 66.0 in | Wt 163.6 lb

## 2023-05-24 DIAGNOSIS — F5104 Psychophysiologic insomnia: Secondary | ICD-10-CM

## 2023-05-24 DIAGNOSIS — M791 Myalgia, unspecified site: Secondary | ICD-10-CM

## 2023-05-24 DIAGNOSIS — R7303 Prediabetes: Secondary | ICD-10-CM

## 2023-05-24 DIAGNOSIS — T466X5A Adverse effect of antihyperlipidemic and antiarteriosclerotic drugs, initial encounter: Secondary | ICD-10-CM

## 2023-05-24 DIAGNOSIS — E7849 Other hyperlipidemia: Secondary | ICD-10-CM | POA: Diagnosis not present

## 2023-05-24 DIAGNOSIS — K219 Gastro-esophageal reflux disease without esophagitis: Secondary | ICD-10-CM

## 2023-05-24 DIAGNOSIS — F4322 Adjustment disorder with anxiety: Secondary | ICD-10-CM

## 2023-05-24 LAB — POCT GLYCOSYLATED HEMOGLOBIN (HGB A1C): Hemoglobin A1C: 5.6 % (ref 4.0–5.6)

## 2023-05-24 NOTE — Patient Instructions (Signed)
 Please return in June for your physical.  I will release your lab results to you on your MyChart account with further instructions. You may see the results before I do, but when I review them I will send you a message with my report or have my assistant call you if things need to be discussed. Please reply to my message with any questions. Thank you!   If you have any questions or concerns, please don't hesitate to send me a message via MyChart or call the office at [phone]. Thank you for visiting with [place] today! It's our pleasure caring for you.

## 2023-05-24 NOTE — Progress Notes (Signed)
 Subjective  CC:  Chief Complaint  Patient presents with   Concerns with medications    Pt state that she has stopped taking her , Lexapro, crestor, and repatha for the past  weeks bc she would like to try all Herbal/natural products and would like provider's input    HPI: Rachel [name] is a 48 y.o. female who presents to the office today for follow up of diabetes and problems listed above in the chief complaint.   Discussed the use of AI scribe software for clinical note transcription with the patient, who gave verbal consent to proceed.  History of Present Illness   The patient, with familial hyperlipidemia, presents with generalized musculoskeletal pain.  She experiences generalized musculoskeletal pain, particularly in the upper body, including the neck, shoulders, and lower back. The pain has persisted for the past month, occurring daily and described as feeling 'like I was beat up all night long.' It is not related to physical activity and occurs even after rest. She has tried massage therapy and scheduled stretches to alleviate the pain.  She has a history of inconsistent use of Repatha, taking it less than 50% of the time, and has experienced joint pain with both Repatha and Crestor in the past. She stopped Crestor a month ago but continues to experience pain. She previously took Crestor three times a week but was not consistent with it. She restarted Crestor and Repatha after seeing her cholesterol levels but has since stopped; she prefers to try alternative therapies.  She is interested in trying herbal medicine, including turmeric with ginger and black pepper, and dendritium. She maintains a gluten-free diet, avoids sugar and fried foods, and consumes low glycemic index fruits. She exercises regularly and is exploring alternative therapies such as chiropractic care and massage.  She has a history of familial hyperlipidemia and is aware that her cholesterol levels are influenced by  genetics rather than diet. She expresses sensitivity to medications and has stopped taking Lexapro more than a month ago, initially reducing the dose to 5 mg before discontinuing it due to persistent overthinking.  She has experienced dissatisfaction with her current counseling sessions, noting that the focus has shifted away from her personal issues. She is considering trying a new counselor recommended by her sister, despite insurance coverage challenges.      Wt Readings from Last 3 Encounters:  05/24/23 163 lb 9.6 oz (74.2 kg)  02/22/23 166 lb 3.2 oz (75.4 kg)  02/05/23 166 lb 3.2 oz (75.4 kg)    BP Readings from Last 3 Encounters:  05/24/23 108/70  02/22/23 112/84  01/21/23 104/78    Assessment  1. Prediabetes   2. Familial hyperlipidemia   3. Myalgia due to statin   4. Adjustment reaction with anxiety   5. Psychophysiological insomnia      Plan  Assessment and Plan    Generalized Musculoskeletal Pain Pain persists in upper body, possibly linked to past Crestor use. Stress and tension may contribute. - Continue massage therapy every 15-20 days. - Consider chiropractic care for upper back pain.  Familial Hyperlipidemia Discontinued Repatha and Crestor due to joint pain. Emphasized genetic nature and cardiovascular risk. Discussed calcium score test utility. - Order blood work to check cholesterol levels. - Discuss potential benefits and risks of cholesterol-lowering medications. - Consider calcium score test if recommended by cardiologist.  Prediabetes A1c levels below 6.5 (5.6 today). Manages with diet and exercise. Discussed lifestyle management importance. - Continue diet and exercise regimen. - Consider exploring  berberine for blood sugar management.  Anxiety Previously on Lexapro, tapered off due to inefficacy. Dissatisfied with current counselor. Discussed consistent management importance. - Explore new counseling options. - Monitor mood following  discontinuation of Lexapro.        Follow up: June for cpe Orders Placed This Encounter  Procedures   POCT HgB A1C   No orders of the defined types were placed in this encounter.     Immunization History  Administered Date(s) Administered   Influenza,inj,Quad PF,6+ Mos 12/27/2019, 12/21/2020, 12/16/2021   Influenza-Unspecified 12/13/2015, 12/06/2017, 12/09/2018, 11/23/2022   PFIZER(Purple Top)SARS-COV-2 Vaccination 08/24/2019, 09/12/2019   Tdap 01/17/2014    Diabetes Related Lab Review: Lab Results  Component Value Date   HGBA1C 5.7 07/23/2022   HGBA1C 5.7 02/28/2022   HGBA1C 5.4 02/23/2022    Lab Results  Component Value Date   MICROALBUR 1.0 02/23/2022   Lab Results  Component Value Date   CREATININE 0.52 07/23/2022   BUN 12 07/23/2022   NA 138 07/23/2022   K 4.1 07/23/2022   CL 100 07/23/2022   CO2 28 07/23/2022   Lab Results  Component Value Date   CHOL 345 (H) 02/05/2023   CHOL 251 (H) 02/28/2022   CHOL 138 12/21/2020   Lab Results  Component Value Date   HDL 49.90 02/05/2023   HDL 54.30 02/28/2022   HDL 53.10 12/21/2020   Lab Results  Component Value Date   LDLCALC 265 (H) 02/05/2023   LDLCALC 172 (H) 02/28/2022   LDLCALC 63 12/21/2020   Lab Results  Component Value Date   TRIG 151.0 (H) 02/05/2023   TRIG 122.0 02/28/2022   TRIG 109.0 12/21/2020   Lab Results  Component Value Date   CHOLHDL 7 02/05/2023   CHOLHDL 5 02/28/2022   CHOLHDL 3 12/21/2020   No results found for: "LDLDIRECT" The ASCVD Risk score (Arnett DK, et al., 2019) failed to calculate for the following reasons:   The valid total cholesterol range is 130 to 320 mg/dL I have reviewed the PMH, Fam and Soc history. Patient Active Problem List   Diagnosis Date Noted Date Diagnosed   Myalgia due to statin 09/02/2020     Priority: High   Prediabetes 12/12/2016     Priority: High   Familial hyperlipidemia 08/17/2015     Priority: High    Repatha; statin intolerant Lipid  clinic    Perimenopausal 08/29/2022     Priority: Medium    Positive ANA (antinuclear antibody) 07/31/2022     Priority: Medium     Low titre 1:40, nuclear speckled, nl esr.  Ordered due to hand/foot parasthesias.    Migraine without aura, not intractable, without status migrainosus 12/07/2020     Priority: Medium    GERD (gastroesophageal reflux disease) 08/10/2020     Priority: Medium    History of Helicobacter pylori infection 08/10/2020     Priority: Medium    History of peptic ulcer 08/10/2020     Priority: Medium    Adjustment reaction with anxiety 06/26/2017     Priority: Medium     Responsive to lexapro 10, sept 2024; did not respond to buspar, but lexapro causes some am nausea    Chronic migraine 12/28/2016     Priority: Medium     Referred to neurology. Did not tolerate topiramate.    Insomnia 11/01/2015     Priority: Medium    Orthostatic hypotension 08/17/2015     Priority: Medium    Chronic constipation 01/02/2022  Priority: Low    Resolved with gluten free and lactose free diet 2023    TMJ (dislocation of temporomandibular joint) 01/10/2021     Priority: Low   Genetic testing 07/07/2022     Negative [facility] CancerNext-Expanded +RNAinsight.  Report date is 05/29/2022.   The CancerNext-Expanded gene panel offered by [facility] and includes sequencing, rearrangement, and RNA analysis for the following 71 genes:  AIP, ALK, APC, ATM, BAP1, BARD1, BMPR1A, BRCA1, BRCA2, BRIP1, CDC73, CDH1, CDK4, CDKN1B, CDKN2A, CHEK2, DICER1, FH, FLCN, KIF1B, LZTR1,MAX, MEN1, MET, MLH1, MSH2, MSH6, MUTYH, NF1, NF2, NTHL1, PALB2, PHOX2B, PMS2, POT1, PRKAR1A, PTCH1, PTEN, RAD51C,RAD51D, RB1, RET, SDHA, SDHAF2, SDHB, SDHC, SDHD, SMAD4, SMARCA4, SMARCB1, SMARCE1, STK11, SUFU, TMEM127, TP53,TSC1, TSC2 and VHL (sequencing and deletion/duplication); AXIN2, CTNNA1, EGFR, EGLN1, HOXB13, KIT, MITF, MSH3, PDGFRA, POLD1 and POLE (sequencing only); EPCAM and GREM1 (deletion/duplication only).        Social History: Patient  reports that she has never smoked. She has never used smokeless tobacco. She reports that she does not drink alcohol and does not use drugs.  Review of Systems: Ophthalmic: negative for eye pain, loss of vision or double vision Cardiovascular: negative for chest pain Respiratory: negative for SOB or persistent cough Gastrointestinal: negative for abdominal pain Genitourinary: negative for dysuria or gross hematuria MSK: negative for foot lesions Neurologic: negative for weakness or gait disturbance  Objective  Vitals: BP 108/70   Pulse 78   Temp 98.3 F (36.8 C)   Ht 5\' 6"  (1.676 m)   Wt 163 lb 9.6 oz (74.2 kg)   SpO2 99%   BMI 26.41 kg/m  General: well appearing, no acute distress  Psych:  Alert and oriented, normal mood and affect  Diabetic education: ongoing education regarding chronic disease management for diabetes was given today. We continue to reinforce the ABC's of diabetic management: A1c (<7 or 8 dependent upon patient), tight blood pressure control, and cholesterol management with goal LDL < 100 minimally. We discuss diet strategies, exercise recommendations, medication options and possible side effects. At each visit, we review recommended immunizations and preventive care recommendations for diabetics and stress that good diabetic control can prevent other problems. See below for this patient's data.   Commons side effects, risks, benefits, and alternatives for medications and treatment plan prescribed today were discussed, and the patient expressed understanding of the given instructions. Patient is instructed to call or message via MyChart if he/she has any questions or concerns regarding our treatment plan. No barriers to understanding were identified. We discussed Red Flag symptoms and signs in detail. Patient expressed understanding regarding what to do in case of urgent or emergency type symptoms.  Medication list was reconciled,  printed and provided to the patient in AVS. Patient instructions and summary information was reviewed with the patient as documented in the AVS. This note was prepared with assistance of Dragon voice recognition software. Occasional wrong-word or sound-a-like substitutions may have occurred due to the inherent limitations of voice recognition software

## 2023-06-03 DIAGNOSIS — M6240 Contracture of muscle, unspecified site: Secondary | ICD-10-CM | POA: Diagnosis not present

## 2023-06-03 DIAGNOSIS — M9907 Segmental and somatic dysfunction of upper extremity: Secondary | ICD-10-CM | POA: Diagnosis not present

## 2023-06-03 DIAGNOSIS — M9901 Segmental and somatic dysfunction of cervical region: Secondary | ICD-10-CM | POA: Diagnosis not present

## 2023-06-03 DIAGNOSIS — M9902 Segmental and somatic dysfunction of thoracic region: Secondary | ICD-10-CM | POA: Diagnosis not present

## 2023-06-06 DIAGNOSIS — M9902 Segmental and somatic dysfunction of thoracic region: Secondary | ICD-10-CM | POA: Diagnosis not present

## 2023-06-06 DIAGNOSIS — M9907 Segmental and somatic dysfunction of upper extremity: Secondary | ICD-10-CM | POA: Diagnosis not present

## 2023-06-06 DIAGNOSIS — M9901 Segmental and somatic dysfunction of cervical region: Secondary | ICD-10-CM | POA: Diagnosis not present

## 2023-06-06 DIAGNOSIS — M6240 Contracture of muscle, unspecified site: Secondary | ICD-10-CM | POA: Diagnosis not present

## 2023-06-10 DIAGNOSIS — M9901 Segmental and somatic dysfunction of cervical region: Secondary | ICD-10-CM | POA: Diagnosis not present

## 2023-06-10 DIAGNOSIS — M9902 Segmental and somatic dysfunction of thoracic region: Secondary | ICD-10-CM | POA: Diagnosis not present

## 2023-06-10 DIAGNOSIS — M9907 Segmental and somatic dysfunction of upper extremity: Secondary | ICD-10-CM | POA: Diagnosis not present

## 2023-06-10 DIAGNOSIS — M6240 Contracture of muscle, unspecified site: Secondary | ICD-10-CM | POA: Diagnosis not present

## 2023-06-12 DIAGNOSIS — M9902 Segmental and somatic dysfunction of thoracic region: Secondary | ICD-10-CM | POA: Diagnosis not present

## 2023-06-12 DIAGNOSIS — M9907 Segmental and somatic dysfunction of upper extremity: Secondary | ICD-10-CM | POA: Diagnosis not present

## 2023-06-12 DIAGNOSIS — M9901 Segmental and somatic dysfunction of cervical region: Secondary | ICD-10-CM | POA: Diagnosis not present

## 2023-06-12 DIAGNOSIS — M6240 Contracture of muscle, unspecified site: Secondary | ICD-10-CM | POA: Diagnosis not present

## 2023-06-14 ENCOUNTER — Other Ambulatory Visit (HOSPITAL_BASED_OUTPATIENT_CLINIC_OR_DEPARTMENT_OTHER): Payer: Self-pay | Admitting: Family Medicine

## 2023-06-14 DIAGNOSIS — Z1231 Encounter for screening mammogram for malignant neoplasm of breast: Secondary | ICD-10-CM

## 2023-06-20 DIAGNOSIS — M9907 Segmental and somatic dysfunction of upper extremity: Secondary | ICD-10-CM | POA: Diagnosis not present

## 2023-06-20 DIAGNOSIS — M9901 Segmental and somatic dysfunction of cervical region: Secondary | ICD-10-CM | POA: Diagnosis not present

## 2023-06-20 DIAGNOSIS — M9902 Segmental and somatic dysfunction of thoracic region: Secondary | ICD-10-CM | POA: Diagnosis not present

## 2023-06-20 DIAGNOSIS — M6240 Contracture of muscle, unspecified site: Secondary | ICD-10-CM | POA: Diagnosis not present

## 2023-06-21 ENCOUNTER — Inpatient Hospital Stay (HOSPITAL_BASED_OUTPATIENT_CLINIC_OR_DEPARTMENT_OTHER): Admission: RE | Admit: 2023-06-21 | Source: Ambulatory Visit | Admitting: Radiology

## 2023-06-26 DIAGNOSIS — M9901 Segmental and somatic dysfunction of cervical region: Secondary | ICD-10-CM | POA: Diagnosis not present

## 2023-06-26 DIAGNOSIS — M6240 Contracture of muscle, unspecified site: Secondary | ICD-10-CM | POA: Diagnosis not present

## 2023-06-26 DIAGNOSIS — M9902 Segmental and somatic dysfunction of thoracic region: Secondary | ICD-10-CM | POA: Diagnosis not present

## 2023-06-26 DIAGNOSIS — M9907 Segmental and somatic dysfunction of upper extremity: Secondary | ICD-10-CM | POA: Diagnosis not present

## 2023-07-02 ENCOUNTER — Encounter (HOSPITAL_BASED_OUTPATIENT_CLINIC_OR_DEPARTMENT_OTHER): Payer: Self-pay | Admitting: Radiology

## 2023-07-02 ENCOUNTER — Ambulatory Visit (HOSPITAL_BASED_OUTPATIENT_CLINIC_OR_DEPARTMENT_OTHER)
Admission: RE | Admit: 2023-07-02 | Discharge: 2023-07-02 | Disposition: A | Discharge status: Home / Self Care | Source: Ambulatory Visit | Attending: Family Medicine | Admitting: Family Medicine

## 2023-07-02 DIAGNOSIS — Z1231 Encounter for screening mammogram for malignant neoplasm of breast: Secondary | ICD-10-CM | POA: Diagnosis not present

## 2023-07-09 DIAGNOSIS — M9907 Segmental and somatic dysfunction of upper extremity: Secondary | ICD-10-CM | POA: Diagnosis not present

## 2023-07-09 DIAGNOSIS — M9901 Segmental and somatic dysfunction of cervical region: Secondary | ICD-10-CM | POA: Diagnosis not present

## 2023-07-09 DIAGNOSIS — M9902 Segmental and somatic dysfunction of thoracic region: Secondary | ICD-10-CM | POA: Diagnosis not present

## 2023-07-09 DIAGNOSIS — M6240 Contracture of muscle, unspecified site: Secondary | ICD-10-CM | POA: Diagnosis not present

## 2023-07-23 ENCOUNTER — Encounter: Payer: Self-pay | Admitting: Family Medicine

## 2023-07-23 MED ORDER — TIRZEPATIDE 2.5 MG/0.5ML ~~LOC~~ SOAJ: 2.5000 mg | SUBCUTANEOUS | Status: DC

## 2023-07-24 DIAGNOSIS — M9901 Segmental and somatic dysfunction of cervical region: Secondary | ICD-10-CM | POA: Diagnosis not present

## 2023-07-24 DIAGNOSIS — M6240 Contracture of muscle, unspecified site: Secondary | ICD-10-CM | POA: Diagnosis not present

## 2023-07-24 DIAGNOSIS — M9902 Segmental and somatic dysfunction of thoracic region: Secondary | ICD-10-CM | POA: Diagnosis not present

## 2023-07-24 DIAGNOSIS — M9907 Segmental and somatic dysfunction of upper extremity: Secondary | ICD-10-CM | POA: Diagnosis not present

## 2023-08-08 DIAGNOSIS — M9902 Segmental and somatic dysfunction of thoracic region: Secondary | ICD-10-CM | POA: Diagnosis not present

## 2023-08-08 DIAGNOSIS — M9906 Segmental and somatic dysfunction of lower extremity: Secondary | ICD-10-CM | POA: Diagnosis not present

## 2023-08-08 DIAGNOSIS — M9907 Segmental and somatic dysfunction of upper extremity: Secondary | ICD-10-CM | POA: Diagnosis not present

## 2023-08-08 DIAGNOSIS — M6240 Contracture of muscle, unspecified site: Secondary | ICD-10-CM | POA: Diagnosis not present

## 2023-08-08 DIAGNOSIS — M9901 Segmental and somatic dysfunction of cervical region: Secondary | ICD-10-CM | POA: Diagnosis not present

## 2023-08-15 ENCOUNTER — Other Ambulatory Visit (HOSPITAL_BASED_OUTPATIENT_CLINIC_OR_DEPARTMENT_OTHER): Payer: Self-pay

## 2023-08-15 ENCOUNTER — Other Ambulatory Visit: Payer: Self-pay | Admitting: Family Medicine

## 2023-08-15 ENCOUNTER — Telehealth: Payer: Self-pay

## 2023-08-15 ENCOUNTER — Other Ambulatory Visit: Payer: Self-pay

## 2023-08-15 MED ORDER — MOUNJARO 2.5 MG/0.5ML ~~LOC~~ SOAJ
2.5000 mg | SUBCUTANEOUS | 3 refills | Status: DC
  Filled 2023-08-15: qty 2, 28d supply, fill #0

## 2023-08-15 MED ORDER — TIRZEPATIDE 2.5 MG/0.5ML ~~LOC~~ SOAJ: 2.5000 mg | SUBCUTANEOUS | 3 refills | Status: DC

## 2023-08-15 NOTE — Telephone Encounter (Signed)
 Pharmacy Patient Advocate Encounter   Received notification from CoverMyMeds that prior authorization for Mounjaro  2.5MG /0.5ML auto-injectors is required/requested.   Insurance verification completed.   The patient is insured through [facility] .   Per test claim: PA required; PA submitted to above mentioned insurance via CoverMyMeds Key/confirmation #/EOC [id number] Status is pending

## 2023-08-16 ENCOUNTER — Other Ambulatory Visit (HOSPITAL_COMMUNITY): Payer: Self-pay

## 2023-08-16 ENCOUNTER — Other Ambulatory Visit (HOSPITAL_BASED_OUTPATIENT_CLINIC_OR_DEPARTMENT_OTHER): Payer: Self-pay

## 2023-08-19 ENCOUNTER — Other Ambulatory Visit (HOSPITAL_BASED_OUTPATIENT_CLINIC_OR_DEPARTMENT_OTHER): Payer: Self-pay

## 2023-08-19 NOTE — Telephone Encounter (Signed)
 Pharmacy Patient Advocate Encounter  Received notification from [facility] that Prior Authorization for Mounjaro  2.5MG /0.5ML auto-injectors  has been APPROVED from 08/15/23 to 08/14/24. Ran test claim, Copay is $25. This test claim was processed through [facility]- copay amounts may vary at other pharmacies due to pharmacy/plan contracts, or as the patient moves through the different stages of their insurance plan.   PA #/Case ID/Reference #: [place]

## 2023-08-20 ENCOUNTER — Encounter: Payer: Self-pay | Admitting: Family Medicine

## 2023-08-20 ENCOUNTER — Other Ambulatory Visit (HOSPITAL_BASED_OUTPATIENT_CLINIC_OR_DEPARTMENT_OTHER): Payer: Self-pay

## 2023-08-20 ENCOUNTER — Ambulatory Visit: Admitting: Family Medicine

## 2023-08-20 VITALS — BP 106/70 | HR 75 | Temp 98.3°F | Ht 66.0 in | Wt 167.2 lb

## 2023-08-20 DIAGNOSIS — M2550 Pain in unspecified joint: Secondary | ICD-10-CM

## 2023-08-20 LAB — COMPREHENSIVE METABOLIC PANEL WITH GFR
ALT: 10 U/L (ref 0–35)
AST: 14 U/L (ref 0–37)
Albumin: 4.5 g/dL (ref 3.5–5.2)
Alkaline Phosphatase: 57 U/L (ref 39–117)
BUN: 10 mg/dL (ref 6–23)
CO2: 29 meq/L (ref 19–32)
Calcium: 9.4 mg/dL (ref 8.4–10.5)
Chloride: 102 meq/L (ref 96–112)
Creatinine, Ser: 0.44 mg/dL (ref 0.40–1.20)
GFR: 115.49 mL/min (ref >60.00)
Glucose, Bld: 83 mg/dL (ref 70–99)
Potassium: 4.4 meq/L (ref 3.5–5.1)
Sodium: 139 meq/L (ref 135–145)
Total Bilirubin: 0.9 mg/dL (ref 0.2–1.2)
Total Protein: 7.5 g/dL (ref 6.0–8.3)

## 2023-08-20 LAB — C-REACTIVE PROTEIN: CRP: <1 mg/dL (ref 0.5–20.0)

## 2023-08-20 LAB — VITAMIN D 25 HYDROXY (VIT D DEFICIENCY, FRACTURES): VITD: 37.48 ng/mL (ref 30.00–100.00)

## 2023-08-20 LAB — TSH: TSH: 1.07 u[IU]/mL (ref 0.35–5.50)

## 2023-08-20 LAB — CK: Total CK: 62 U/L (ref 7–177)

## 2023-08-20 LAB — SEDIMENTATION RATE: Sed Rate: 12 mm/h (ref 0–20)

## 2023-08-20 MED ORDER — PREDNISONE 10 MG PO TABS
ORAL_TABLET | ORAL | 0 refills | Status: DC
  Filled 2023-08-20: qty 21, 9d supply, fill #0

## 2023-08-20 NOTE — Progress Notes (Signed)
 Subjective  CC:  Chief Complaint  Patient presents with   Joint Pain    Pt stated that she has been experiencing joint pain that has been ongoing but gotten worse over the past 2 weeks.Icing makes it better. More pain is contributing on the morning side    HPI: Rachel [name] is a 48 y.o. female who presents to the office today to address the problems listed above in the chief complaint. Discussed the use of AI scribe software for clinical note transcription with the patient, who gave verbal consent to proceed.  History of Present Illness Rachel [name] is a 48 year old female who presents with two weeks of joint pain.  She has been experiencing joint pain for the past two weeks, which began after recovering from a cold. The pain is located in her hands, shoulders, knees, elbows, and Achilles tendon. It is persistent, with some days being better than others, and it significantly affects her daily activities. She describes the pain as tender and sharp, particularly in her knees, which sometimes impedes her ability to walk.  She has a history of Achilles issues and is currently using tape for support. She has been taking ibuprofen  for pain relief, but it causes stomach upset due to her history of a stomach ulcer. She has also been using turmeric for inflammation without significant improvement.  Her sleep has improved compared to previous months, now getting about five hours per night, and she feels more rested. She has a history of low vitamin D  levels but is unsure if they have been rechecked recently. She started Mounjaro  four weeks ago and experiences mild nausea as a side effect.  No body aches, rash, fever, chills, swelling, redness, or warmth in joints. Her energy levels are good. She has been experiencing stress related to her son's exam.   Assessment  1. Polyarthralgia      Plan  Assessment and Plan Assessment & Plan Polyarticular Joint Pain Joint pain in multiple areas  post-cold, without swelling, redness, or warmth. Inflammatory arthropathy less likely. Mounjaro  initiation considered as a factor. Inflammation suspected despite expected normal markers. Prednisone trial proposed. - Order sed rate, CRP, CBC, rheumatoid factor, vitamin D , and thyroid  function tests. - Prescribe prednisone to assess anti-inflammatory response.  Stomach Ulcer Stomach ulcers limit NSAID use. Ibuprofen  alleviates pain but causes stomach upset. - Avoid NSAIDs due to stomach ulcers.  General Health Maintenance Low vitamin D  levels noted. Sleep duration below recommended levels. - Recheck vitamin D  levels. - Advise on achieving 8-9 hours of sleep per night.    Follow up: prn Orders Placed This Encounter  Procedures   ANA   CK   Comprehensive metabolic panel with GFR   Sedimentation rate   C-reactive protein   Rheumatoid Factor   TSH   VITAMIN D  25 Hydroxy (Vit-D Deficiency, Fractures)   Meds ordered this encounter  Medications   predniSONE (DELTASONE) 10 MG tablet    Sig: Take 4 tabs qd x 2 days, 3 qd x 2 days, 2 qd x 2d, 1qd x 3 days    Dispense:  21 tablet    Refill:  0     I reviewed the patients updated PMH, FH, and SocHx.  Patient Active Problem List   Diagnosis Date Noted   Myalgia due to statin 09/02/2020    Priority: High   Prediabetes 12/12/2016    Priority: High   Familial hyperlipidemia 08/17/2015    Priority: High   Perimenopausal 08/29/2022  Priority: Medium    Positive ANA (antinuclear antibody) 07/31/2022    Priority: Medium    Migraine without aura, not intractable, without status migrainosus 12/07/2020    Priority: Medium    GERD (gastroesophageal reflux disease) 08/10/2020    Priority: Medium    History of Helicobacter pylori infection 08/10/2020    Priority: Medium    History of peptic ulcer 08/10/2020    Priority: Medium    Adjustment reaction with anxiety 06/26/2017    Priority: Medium    Chronic migraine 12/28/2016     Priority: Medium    Insomnia 11/01/2015    Priority: Medium    Orthostatic hypotension 08/17/2015    Priority: Medium    Chronic constipation 01/02/2022    Priority: Low   TMJ (dislocation of temporomandibular joint) 01/10/2021    Priority: Low   Genetic testing 07/07/2022   Current Meds  Medication Sig   Magnesium 400 MG CAPS Take by mouth.   predniSONE (DELTASONE) 10 MG tablet Take 4 tabs qd x 2 days, 3 qd x 2 days, 2 qd x 2d, 1qd x 3 days   Allergies: Patient has no known allergies. Family History: Patient family history includes Breast cancer (age of onset: 52) in her paternal aunt; Celiac disease in her sister; Diabetes in her sister; Heart disease in her sister; Hyperlipidemia in her daughter, mother, sister, and another family member; Irritable bowel syndrome in her sister; Spina bifida in her son. Social History:  Patient  reports that she has never smoked. She has never used smokeless tobacco. She reports that she does not drink alcohol and does not use drugs.  Review of Systems: Constitutional: Negative for fever malaise or anorexia Cardiovascular: negative for chest pain Respiratory: negative for SOB or persistent cough Gastrointestinal: negative for abdominal pain  Objective  Vitals: BP 106/70   Pulse 75   Temp 98.3 F (36.8 C)   Ht 5\' 6"  (1.676 m)   Wt 167 lb 3.2 oz (75.8 kg)   SpO2 99%   BMI 26.99 kg/m  General: no acute distress , A&Ox3 Joints: nl appearing, tender mcp, pip dip bilateral hands. Nl knee exams.  Skin:  Warm, no rashes Commons side effects, risks, benefits, and alternatives for medications and treatment plan prescribed today were discussed, and the patient expressed understanding of the given instructions. Patient is instructed to call or message via MyChart if he/she has any questions or concerns regarding our treatment plan. No barriers to understanding were identified. We discussed Red Flag symptoms and signs in detail. Patient expressed  understanding regarding what to do in case of urgent or emergency type symptoms.  Medication list was reconciled, printed and provided to the patient in AVS. Patient instructions and summary information was reviewed with the patient as documented in the AVS. This note was prepared with assistance of Dragon voice recognition software. Occasional wrong-word or sound-a-like substitutions may have occurred due to the inherent limitations of voice recognition software

## 2023-08-23 LAB — ANA: Anti Nuclear Antibody (ANA): POSITIVE — AB

## 2023-08-23 LAB — ANTI-NUCLEAR AB-TITER (ANA TITER): ANA Titer 1: 1:40 {titer} — ABNORMAL HIGH

## 2023-08-23 LAB — RHEUMATOID FACTOR: Rheumatoid fact SerPl-aCnc: <10 [IU]/mL (ref <14)

## 2023-08-25 ENCOUNTER — Ambulatory Visit: Payer: Self-pay | Admitting: Family Medicine

## 2023-08-25 NOTE — Progress Notes (Signed)
 See mychart note Dear Rachel [name], Your results are all stable and similar to last years with the non specific finding of a positive ANA. All other inflammatory markers are normal.  We can consider a rheumatology evaluation if they will see you for further clarification? Sincerely, Dr. Jonelle Neri

## 2023-08-31 ENCOUNTER — Other Ambulatory Visit (HOSPITAL_BASED_OUTPATIENT_CLINIC_OR_DEPARTMENT_OTHER): Payer: Self-pay

## 2023-09-02 ENCOUNTER — Other Ambulatory Visit: Payer: Self-pay

## 2023-09-02 DIAGNOSIS — M2550 Pain in unspecified joint: Secondary | ICD-10-CM

## 2023-09-04 DIAGNOSIS — M9902 Segmental and somatic dysfunction of thoracic region: Secondary | ICD-10-CM | POA: Diagnosis not present

## 2023-09-04 DIAGNOSIS — M9906 Segmental and somatic dysfunction of lower extremity: Secondary | ICD-10-CM | POA: Diagnosis not present

## 2023-09-04 DIAGNOSIS — M9907 Segmental and somatic dysfunction of upper extremity: Secondary | ICD-10-CM | POA: Diagnosis not present

## 2023-09-04 DIAGNOSIS — M6240 Contracture of muscle, unspecified site: Secondary | ICD-10-CM | POA: Diagnosis not present

## 2023-09-04 DIAGNOSIS — M9901 Segmental and somatic dysfunction of cervical region: Secondary | ICD-10-CM | POA: Diagnosis not present

## 2023-09-06 ENCOUNTER — Other Ambulatory Visit (HOSPITAL_BASED_OUTPATIENT_CLINIC_OR_DEPARTMENT_OTHER): Payer: Self-pay

## 2023-09-06 ENCOUNTER — Ambulatory Visit: Payer: Self-pay | Admitting: Family Medicine

## 2023-09-06 ENCOUNTER — Encounter: Payer: Self-pay | Admitting: Family Medicine

## 2023-09-06 ENCOUNTER — Other Ambulatory Visit: Payer: Self-pay

## 2023-09-06 VITALS — BP 94/64 | HR 77 | Temp 98.0°F | Ht 66.0 in | Wt 164.8 lb

## 2023-09-06 DIAGNOSIS — M2550 Pain in unspecified joint: Secondary | ICD-10-CM | POA: Diagnosis not present

## 2023-09-06 DIAGNOSIS — T466X5A Adverse effect of antihyperlipidemic and antiarteriosclerotic drugs, initial encounter: Secondary | ICD-10-CM | POA: Diagnosis not present

## 2023-09-06 DIAGNOSIS — G43009 Migraine without aura, not intractable, without status migrainosus: Secondary | ICD-10-CM

## 2023-09-06 DIAGNOSIS — E119 Type 2 diabetes mellitus without complications: Secondary | ICD-10-CM | POA: Diagnosis not present

## 2023-09-06 DIAGNOSIS — M791 Myalgia, unspecified site: Secondary | ICD-10-CM | POA: Diagnosis not present

## 2023-09-06 DIAGNOSIS — F4322 Adjustment disorder with anxiety: Secondary | ICD-10-CM | POA: Diagnosis not present

## 2023-09-06 DIAGNOSIS — Z0001 Encounter for general adult medical examination with abnormal findings: Secondary | ICD-10-CM | POA: Diagnosis not present

## 2023-09-06 DIAGNOSIS — E7849 Other hyperlipidemia: Secondary | ICD-10-CM

## 2023-09-06 DIAGNOSIS — R768 Other specified abnormal immunological findings in serum: Secondary | ICD-10-CM | POA: Diagnosis not present

## 2023-09-06 DIAGNOSIS — I951 Orthostatic hypotension: Secondary | ICD-10-CM | POA: Diagnosis not present

## 2023-09-06 DIAGNOSIS — Z7985 Long-term (current) use of injectable non-insulin antidiabetic drugs: Secondary | ICD-10-CM

## 2023-09-06 LAB — MICROALBUMIN / CREATININE URINE RATIO
Creatinine,U: 181.5 mg/dL
Microalb Creat Ratio: 9.4 mg/g (ref 0.0–30.0)
Microalb, Ur: 1.7 mg/dL (ref 0.0–1.9)

## 2023-09-06 MED ORDER — TIRZEPATIDE 5 MG/0.5ML ~~LOC~~ SOAJ
5.0000 mg | SUBCUTANEOUS | 5 refills | Status: AC
  Filled 2023-09-06 – 2023-09-08 (×3): qty 2, 28d supply, fill #0
  Filled 2023-11-03: qty 2, 28d supply, fill #1
  Filled 2023-12-12 – 2024-01-16 (×2): qty 2, 28d supply, fill #2
  Filled 2024-03-02: qty 2, 28d supply, fill #3
  Filled 2024-03-29 – 2024-04-24 (×2): qty 2, 28d supply, fill #4

## 2023-09-06 NOTE — Patient Instructions (Signed)
 Please return in 6 months for recheck. Return fasting for your lab draw at your convenience.   I will release your lab results to you on your MyChart account with further instructions. You may see the results before I do, but when I review them I will send you a message with my report or have my assistant call you if things need to be discussed. Please reply to my message with any questions. Thank you!   If you have any questions or concerns, please don't hesitate to send me a message via MyChart or call the office at [phone]. Thank you for visiting with us  today! It's our pleasure caring for you.    VISIT SUMMARY: Today, you came in for a routine follow-up visit to manage your diabetes and other health concerns. We discussed your current medications, symptoms, and future plans for managing your conditions.  YOUR PLAN: -TYPE 2 DIABETES MELLITUS: Type 2 Diabetes Mellitus is a condition where your body does not use insulin  properly, leading to high blood sugar levels. We will increase your Mounjaro  dose to 5 mg to help with weight management and monitor for any side effects. We will also order A1c and urine tests to check your blood sugar control.  -ORTHOSTATIC HYPOTENSION: Orthostatic Hypotension is a condition where your blood pressure drops when you stand up, causing dizziness or fainting. To avoid the risk of fainting, we will not do a blood draw today.  -JOINT PAIN: You have intermittent shoulder joint pain and a positive ANA test, which could indicate an autoimmune or inflammatory condition. We have referred you to a rheumatologist for further evaluation and encourage you to follow up with [facility] Henrine [facility] at [facility].  -HYPERLIPIDEMIA: Hyperlipidemia is a condition where you have high levels of cholesterol in your blood. Although you prefer to avoid medication, it is important to manage your cholesterol levels to reduce the risk of heart disease. We discussed the importance of  cholesterol management.  -GENERAL HEALTH MAINTENANCE: We discussed your eligibility for the Prevnar 20 vaccine due to your diabetes.   INSTRUCTIONS: Please follow up with the rheumatologist as scheduled and try to get an earlier appointment if possible. We will also need to monitor your A1c and urine tests, so please schedule those at your convenience. Consider getting the Prevnar 20 vaccination for added protection.                      Contains text generated by Abridge.                                 Contains text generated by Abridge.

## 2023-09-06 NOTE — Progress Notes (Signed)
 Subjective  Chief Complaint  Patient presents with   Annual Exam    Pt here for Annual Exam and is currently fasting     HPI: Rachel [name] is a 48 y.o. female who presents to [facility] [place] today for a Female Wellness Visit. She also has the concerns and/or needs as listed above in the chief complaint. These will be addressed in addition to the Health Maintenance Visit.   Wellness Visit: annual visit with health maintenance review and exam  HM: mammo, CRC and pap current. Eligible for prevnar 20. Eye exam current.  Chronic disease f/u and/or acute problem visit: (deemed necessary to be done in addition to the wellness visit): Discussed the use of AI scribe software for clinical note transcription with the patient, who gave verbal consent to proceed.  History of Present Illness Rachel [name] is a 48 year old female with diabetes who presents for a routine follow-up visit.  She has a history of diabetes, previously referred to as prediabetes, and is currently on Mounjaro . Her A1c was previously higher than 6.5. She feels tired, attributing it to low blood pressure, a condition she has experienced before, known as orthostatic hypotension. Her weight is stable while on Mounjaro , and she exercises two to three times a week. She experiences occasional pain in the gallbladder area, despite not having a gallbladder. No constipation associated with Mounjaro  use. Diet is very healthy. Getting plenty of protein. Weight loss has plateaued.  She has discontinued Repatha  due to adverse effects, including stomach upset. She is not currently taking any medication for her elevated cholesterol, as she prefers to eliminate some medications to assess her body's response.  She experiences joint pain, particularly in her shoulder, which is occasionally achy. Prednisone  has provided some relief. She has a positive ANA test, which has been positive twice, and she is concerned about potential  underlying conditions. She has an appointment with a rheumatologist in December and is seeking an earlier consultation.  Her migraines are currently well-managed, and she believes magnesium is helping with her mood.   Assessment  1. Encounter for well adult exam with abnormal findings   2. Familial hyperlipidemia   3. Myalgia due to statin   4. Positive ANA (antinuclear antibody)   5. Adjustment reaction with anxiety   6. Controlled type 2 diabetes mellitus without complication, without long-term current use of insulin  [facility])      Plan  Female Wellness Visit: Age appropriate Health Maintenance and Prevention measures were discussed with patient. Included topics are cancer screening recommendations, ways to keep healthy (see AVS) including dietary and exercise recommendations, regular eye and dental care, use of seat belts, and avoidance of moderate alcohol use and tobacco use. Screens are current BMI: discussed patient's BMI and encouraged positive lifestyle modifications to help get to or maintain a target BMI. HM needs and immunizations were addressed and ordered. See below for orders. See HM and immunization section for updates. Pt defers prevnar 20.  Routine labs and screening tests ordered including cmp, cbc and lipids where appropriate. Discussed recommendations regarding Vit D and calcium  supplementation (see AVS)  Chronic disease management visit and/or acute problem visit: Assessment and Plan Assessment & Plan Type 2 Diabetes Mellitus Type 2 Diabetes Mellitus with A1c > 6.5%, managed with Mounjaro . Considering increasing dose to 5 mg for weight management. Monitoring for gallbladder pain. - Increase Mounjaro  to 5 mg, monitor for side effects. - Order A1c and urine tests. - continue diet and  exercise.  - check urine nephropathy screen today  Orthostatic Hypotension Experiencing low blood pressure and fatigue. Prefers to avoid blood draws today due to fainting risk. - Avoid  blood draw today.  Joint Pain Intermittent shoulder and joint pain with positive ANA tests. Awaiting rheumatology evaluation for potential autoimmune or inflammatory conditions. - Refer to rheumatology. - Encourage follow-up with [facility]. Henrine Logan at [facility].  Hyperlipidemia, familial Previously managed with Repatha , discontinued due to gastrointestinal side effects. Prefers to avoid medication but understands importance of cholesterol management. - Discuss cholesterol management importance despite medication preference. Pt understands goals of cares.   Migraines are controlled.  Mood is stable. No meds needed at this time.  General Health Maintenance Discussed Prevnar 20 vaccine eligibility due to diabetes. Hesitant due to age perceptions but informed of benefits and eligibility before age 10. - Recommend Prevnar 20 vaccination.   Follow up: 6 mo for recheck  Orders Placed This Encounter  Procedures   Microalbumin / creatinine urine ratio   CBC with Differential/Platelet   Hemoglobin A1c   Lipid panel   Meds ordered this encounter  Medications   tirzepatide  (MOUNJARO ) 5 MG/0.5ML Pen    Sig: Inject 5 mg into the skin once a week.    Dispense:  2 mL    Refill:  5      Body mass index is 26.6 kg/m. Wt Readings from Last 3 Encounters:  09/06/23 164 lb 12.8 oz (74.8 kg)  08/20/23 167 lb 3.2 oz (75.8 kg)  05/24/23 163 lb 9.6 oz (74.2 kg)     Patient Active Problem List   Diagnosis Date Noted   Myalgia due to statin 09/02/2020    Priority: High   Controlled type 2 diabetes mellitus without complication, without long-term current use of insulin  (HCC) 08/10/2020    Priority: High    Elevated A1c with Dr. Garnet Just. Has been on metformin  and mounjaro . Improved with weight loss and diet.    Prediabetes 12/12/2016    Priority: High   Familial hyperlipidemia 08/17/2015    Priority: High    Repatha ; statin intolerant Lipid clinic, pt declines medications at  this time. 08/2023    Perimenopausal 08/29/2022    Priority: Medium    Positive ANA (antinuclear antibody) 07/31/2022    Priority: Medium     Low titre 1:40, nuclear speckled, nl esr.  Ordered due to hand/foot parasthesias.    Migraine without aura, not intractable, without status migrainosus 12/07/2020    Priority: Medium    GERD (gastroesophageal reflux disease) 08/10/2020    Priority: Medium    History of Helicobacter pylori infection 08/10/2020    Priority: Medium    History of peptic ulcer 08/10/2020    Priority: Medium    Adjustment reaction with anxiety 06/26/2017    Priority: Medium     Responsive to lexapro  10, sept 2024; did not respond to buspar , but lexapro  causes some am nausea    Chronic migraine 12/28/2016    Priority: Medium     Referred to neurology. Did not tolerate topiramate .    Insomnia 11/01/2015    Priority: Medium    Orthostatic hypotension 08/17/2015    Priority: Medium    Chronic constipation 01/02/2022    Priority: Low    Resolved with gluten free and lactose free diet 2023    TMJ (dislocation of temporomandibular joint) 01/10/2021    Priority: Low   Genetic testing 07/07/2022    Negative [facility] CancerNext-Expanded +RNAinsight.  Report date is 05/29/2022.  The CancerNext-Expanded gene panel offered by [facility] and includes sequencing, rearrangement, and RNA analysis for the following 71 genes:  AIP, ALK, APC, ATM, BAP1, BARD1, BMPR1A, BRCA1, BRCA2, BRIP1, CDC73, CDH1, CDK4, CDKN1B, CDKN2A, CHEK2, DICER1, FH, FLCN, KIF1B, LZTR1,MAX, MEN1, MET, MLH1, MSH2, MSH6, MUTYH, NF1, NF2, NTHL1, PALB2, PHOX2B, PMS2, POT1, PRKAR1A, PTCH1, PTEN, RAD51C,RAD51D, RB1, RET, SDHA, SDHAF2, SDHB, SDHC, SDHD, SMAD4, SMARCA4, SMARCB1, SMARCE1, STK11, SUFU, TMEM127, TP53,TSC1, TSC2 and VHL (sequencing and deletion/duplication); AXIN2, CTNNA1, EGFR, EGLN1, HOXB13, KIT, MITF, MSH3, PDGFRA, POLD1 and POLE (sequencing only); EPCAM and GREM1 (deletion/duplication only).       Health Maintenance  Topic Date Due   FOOT EXAM  08/10/2022   Diabetic kidney evaluation - Urine ACR  02/24/2023   Pneumococcal Vaccine 50-66 Years old (1 of 2 - PCV) 05/23/2024 (Originally 08/03/1994)   OPHTHALMOLOGY EXAM  09/15/2023   INFLUENZA VACCINE  10/18/2023   HEMOGLOBIN A1C  11/24/2023   DTaP/Tdap/Td (2 - Td or Tdap) 01/18/2024   MAMMOGRAM  07/01/2024   Diabetic kidney evaluation - eGFR measurement  08/19/2024   Fecal DNA (Cologuard)  08/29/2025   Cervical Cancer Screening (HPV/Pap Cotest)  08/24/2026   Hepatitis C Screening  Completed   HIV Screening  Completed   HPV VACCINES  Aged Out   Meningococcal B Vaccine  Aged Out   COVID-19 Vaccine  Discontinued   Immunization History  Administered Date(s) Administered   Influenza,inj,Quad PF,6+ Mos 12/27/2019, 12/21/2020, 12/16/2021   Influenza-Unspecified 12/13/2015, 12/06/2017, 12/09/2018, 11/23/2022   PFIZER(Purple Top)SARS-COV-2 Vaccination 08/24/2019, 09/12/2019   Tdap 01/17/2014   We updated and reviewed the patient's past history in detail and it is documented below. Allergies: Patient has no known allergies. Past Medical History Patient  has a past medical history of Anxiety, Familial hyperlipidemia (08/17/2015), Gallstones, Menorrhagia with regular cycle (12/07/2020), Migraine without aura, not intractable, without status migrainosus (12/07/2020), Migraines, Orthostatic hypotension (08/17/2015), Prediabetes, and PUD (peptic ulcer disease). Past Surgical History Patient  has a past surgical history that includes Cesarean section and Cholecystectomy (N/A, 10/18/2021). Family History: Patient family history includes Breast cancer (age of onset: 69) in her paternal aunt; Celiac disease in her sister; Diabetes in her sister; Heart disease in her sister; Hyperlipidemia in her daughter, mother, sister, and another family member; Irritable bowel syndrome in her sister; Spina bifida in her son. Social History:  Patient  reports  that she has never smoked. She has never used smokeless tobacco. She reports that she does not drink alcohol and does not use drugs.  Review of Systems: Constitutional: negative for fever or malaise Ophthalmic: negative for photophobia, double vision or loss of vision Cardiovascular: negative for chest pain, dyspnea on exertion, or new LE swelling Respiratory: negative for SOB or persistent cough Gastrointestinal: negative for abdominal pain, change in bowel habits or melena Genitourinary: negative for dysuria or gross hematuria, no abnormal uterine bleeding or disharge Musculoskeletal: negative for new gait disturbance or muscular weakness Integumentary: negative for new or persistent rashes, no breast lumps Neurological: negative for TIA or stroke symptoms Psychiatric: negative for SI or delusions Allergic/Immunologic: negative for hives  Patient Care Team    Relationship Specialty Notifications Start End  Luevenia Saha, MD PCP - General Family Medicine  07/18/22   Jann Melody, MD PCP - Cardiology Cardiology  09/01/20   Cortez Dines, PA-C Physician Assistant Gastroenterology  02/23/22   Emilie Harden, MD Consulting Physician Internal Medicine  02/23/22   Sumner Ends, MD Consulting Physician Hematology  04/09/22  Objective  Vitals: BP 94/64   Pulse 77   Temp 98 F (36.7 C)   Ht 5' 6 (1.676 m)   Wt 164 lb 12.8 oz (74.8 kg)   SpO2 98%   BMI 26.60 kg/m  General:  Well developed, well nourished, no acute distress  Psych:  Alert and orientedx3,normal mood and affect HEENT:  Normocephalic, atraumatic, non-icteric sclera,  supple neck without adenopathy, mass or thyromegaly Cardiovascular:  Normal S1, S2, RRR without gallop, rub or murmur Respiratory:  Good breath sounds bilaterally, CTAB with normal respiratory effort Gastrointestinal: normal bowel sounds, soft, non-tender, no noted masses. No HSM MSK: extremities without edema, joints without erythema or  swelling Neurologic:    Mental status is normal.   No tremor  No visits with results within 1 Day(s) from this visit.  Latest known visit with results is:  Office Visit on 08/20/2023  Component Date Value Ref Range Status   Anti Nuclear Antibody (ANA) 08/20/2023 POSITIVE (A)  NEGATIVE Final   Total CK 08/20/2023 62  7 - 177 U/L Final   Sodium 08/20/2023 139  135 - 145 mEq/L Final   Potassium 08/20/2023 4.4  3.5 - 5.1 mEq/L Final   Chloride 08/20/2023 102  96 - 112 mEq/L Final   CO2 08/20/2023 29  19 - 32 mEq/L Final   Glucose, Bld 08/20/2023 83  70 - 99 mg/dL Final   BUN 11/91/4782 10  6 - 23 mg/dL Final   Creatinine, Ser 08/20/2023 0.44  0.40 - 1.20 mg/dL Final   Total Bilirubin 08/20/2023 0.9  0.2 - 1.2 mg/dL Final   Alkaline Phosphatase 08/20/2023 57  39 - 117 U/L Final   AST 08/20/2023 14  0 - 37 U/L Final   ALT 08/20/2023 10  0 - 35 U/L Final   Total Protein 08/20/2023 7.5  6.0 - 8.3 g/dL Final   Albumin 95/62/1308 4.5  3.5 - 5.2 g/dL Final   GFR 65/78/4696 115.49  >60.00 mL/min Final   Calcium  08/20/2023 9.4  8.4 - 10.5 mg/dL Final   Sed Rate 29/52/8413 12  0 - 20 mm/hr Final   CRP 08/20/2023 <1.0  0.5 - 20.0 mg/dL Final   Rheumatoid fact SerPl-aCnc 08/20/2023 <10  <14 IU/mL Final   TSH 08/20/2023 1.07  0.35 - 5.50 uIU/mL Final   VITD 08/20/2023 37.48  30.00 - 100.00 ng/mL Final   ANA Titer 1 08/20/2023 1:40 (H)  titer Final   ANA Pattern 1 08/20/2023 Nuclear, Speckled (A)   Final    Commons side effects, risks, benefits, and alternatives for medications and treatment plan prescribed today were discussed, and the patient expressed understanding of the given instructions. Patient is instructed to call or message via MyChart if he/she has any questions or concerns regarding our treatment plan. No barriers to understanding were identified. We discussed Red Flag symptoms and signs in detail. Patient expressed understanding regarding what to do in case of urgent or emergency type  symptoms.  Medication list was reconciled, printed and provided to the patient in AVS. Patient instructions and summary information was reviewed with the patient as documented in the AVS. This note was prepared with assistance of Dragon voice recognition software. Occasional wrong-word or sound-a-like substitutions may have occurred due to the inherent limitations of voice recognition software

## 2023-09-06 NOTE — Addendum Note (Signed)
 Addended by: Shelton Dibbles A on: 09/06/2023 12:42 PM   Modules accepted: Orders

## 2023-09-09 ENCOUNTER — Other Ambulatory Visit (HOSPITAL_BASED_OUTPATIENT_CLINIC_OR_DEPARTMENT_OTHER): Payer: Self-pay

## 2023-09-09 ENCOUNTER — Telehealth: Payer: Self-pay

## 2023-09-09 ENCOUNTER — Other Ambulatory Visit (HOSPITAL_COMMUNITY): Payer: Self-pay

## 2023-09-09 NOTE — Telephone Encounter (Signed)
 Pharmacy Patient Advocate Encounter   Received notification from Onbase that prior authorization for Mounjaro  5MG /0.5ML auto-injectors is required/requested.   Insurance verification completed.   The patient is insured through [facility] .   Per test claim: PA required; PA submitted to above mentioned insurance via CoverMyMeds Key/confirmation #/EOC [id number] Status is pending

## 2023-09-09 NOTE — Telephone Encounter (Signed)
 Pharmacy Patient Advocate Encounter  Received notification from [facility] that Prior Authorization for Mounjaro  5MG /0.5ML auto-injectors has been APPROVED from 09/09/23 to 09/08/24   PA #/Case ID/Reference #: [id number]

## 2023-09-12 ENCOUNTER — Other Ambulatory Visit (HOSPITAL_BASED_OUTPATIENT_CLINIC_OR_DEPARTMENT_OTHER): Payer: Self-pay

## 2023-10-10 ENCOUNTER — Other Ambulatory Visit (HOSPITAL_BASED_OUTPATIENT_CLINIC_OR_DEPARTMENT_OTHER): Payer: Self-pay

## 2023-10-10 ENCOUNTER — Other Ambulatory Visit: Payer: Self-pay | Admitting: Family

## 2023-10-10 DIAGNOSIS — T753XXA Motion sickness, initial encounter: Secondary | ICD-10-CM

## 2023-10-10 MED ORDER — ONDANSETRON HCL 4 MG PO TABS
4.0000 mg | ORAL_TABLET | Freq: Three times a day (TID) | ORAL | 0 refills | Status: AC | PRN
Start: 2023-10-10 — End: ?
  Filled 2023-10-10: qty 20, 7d supply, fill #0

## 2023-11-06 ENCOUNTER — Other Ambulatory Visit (HOSPITAL_BASED_OUTPATIENT_CLINIC_OR_DEPARTMENT_OTHER): Payer: Self-pay

## 2023-11-12 ENCOUNTER — Encounter: Payer: Self-pay | Admitting: Family Medicine

## 2023-11-12 ENCOUNTER — Ambulatory Visit (INDEPENDENT_AMBULATORY_CARE_PROVIDER_SITE_OTHER): Admitting: Family Medicine

## 2023-11-12 ENCOUNTER — Other Ambulatory Visit (HOSPITAL_BASED_OUTPATIENT_CLINIC_OR_DEPARTMENT_OTHER): Payer: Self-pay

## 2023-11-12 VITALS — BP 101/69 | HR 73 | Temp 98.2°F | Ht 66.0 in | Wt 161.8 lb

## 2023-11-12 DIAGNOSIS — F410 Panic disorder [episodic paroxysmal anxiety] without agoraphobia: Secondary | ICD-10-CM | POA: Diagnosis not present

## 2023-11-12 DIAGNOSIS — F5104 Psychophysiologic insomnia: Secondary | ICD-10-CM

## 2023-11-12 DIAGNOSIS — E7849 Other hyperlipidemia: Secondary | ICD-10-CM

## 2023-11-12 DIAGNOSIS — F4322 Adjustment disorder with anxiety: Secondary | ICD-10-CM | POA: Diagnosis not present

## 2023-11-12 MED ORDER — ALPRAZOLAM 0.5 MG PO TABS
0.5000 mg | ORAL_TABLET | Freq: Every day | ORAL | 0 refills | Status: DC | PRN
Start: 2023-11-12 — End: 2024-01-16
  Filled 2023-11-12: qty 10, 10d supply, fill #0

## 2023-11-12 NOTE — Progress Notes (Signed)
 Subjective  CC:  Chief Complaint  Patient presents with   Medication Problem    Pt stated that she started a new medication while she was out of town and would like to discuss with PCP. Eye exam is scheduled for 9/12.    HPI: Rachel [name] is a 48 y.o. female who presents to the office today to address the problems listed above in the chief complaint. Discussed the use of AI scribe software for clinical note transcription with the patient, who gave verbal consent to proceed.  History of Present Illness Rachel [name] is a 47 year old female who presents with panic attacks and anxiety.  She has experienced multiple panic attacks recently, particularly during a visit to her mother. These episodes were characterized by a sensation of her heart 'exploding' and led to hospitalization. She attributes these attacks to stress related to her mother's situation, her brother's health issues, and news about her sick son.  She was started on paroxetine at 20 mg once daily and was instructed to increase to 40 mg once daily after one week, as per the hospital doctor's instructions. Since beginning the medication on October 19, 2023, she has experienced increased yawning and occasional drowsiness, especially after consuming coffee. She has not had any further panic attacks since starting the medication. At the hospital, she received an IV version of Xanax  but does not have a current prescription for it.  This is her first experience with panic attacks, although she has a history of anxiety. She describes the panic attacks as feeling like she was 'losing control of everything'. Her sister took her to the beach for a few days, which she found helpful in managing her stress. She has failed zoloft  and lexapro  in the past.   Her family history includes her brother having heart issues requiring surgery, which she found distressing. We discussed her untreated familial hyperlipidemia and association with CVD.  She is  using trazodone  as needed for sleep and it is helpful.    Assessment  1. Panic attacks   2. Adjustment reaction with anxiety   3. Familial hyperlipidemia   4. Psychophysiological insomnia      Plan  Assessment and Plan Assessment & Plan Panic disorder without agoraphobia Recent panic attacks, initially severe. Paroxetine 40 mg daily effective, no attacks since initiation. Minor side effects noted. - Continue paroxetine 40 mg daily  - Monitor for side effects and efficacy over 3 to 6 weeks. - Prescribe Xanax  for acute panic attacks. To have only if needed. - reassess efficacy in 6-12 weeks.  Anxiety/adjustment reaction; discussed how paxil will help manage this as well.   Discussed importance of treating hyperlipidemia given family history of premature cardiovascular disease. Pt will consider it.   Reassured, ok to continue prn trazodone  for sleep with paxil.     Follow up: 6 -12 weeks to reassess anxiety No orders of the defined types were placed in this encounter.  Meds ordered this encounter  Medications   ALPRAZolam  (XANAX ) 0.5 MG tablet    Sig: Take 1 tablet (0.5 mg total) by mouth daily as needed (panic attack).    Dispense:  10 tablet    Refill:  0     I reviewed the patients updated PMH, FH, and SocHx.  Patient Active Problem List   Diagnosis Date Noted   Myalgia due to statin 09/02/2020    Priority: High   Controlled type 2 diabetes mellitus without complication, without long-term current use of insulin  (HCC)  08/10/2020    Priority: High   Familial hyperlipidemia 08/17/2015    Priority: High   Perimenopausal 08/29/2022    Priority: Medium    Positive ANA (antinuclear antibody) 07/31/2022    Priority: Medium    Migraine without aura, not intractable, without status migrainosus 12/07/2020    Priority: Medium    GERD (gastroesophageal reflux disease) 08/10/2020    Priority: Medium    History of Helicobacter pylori infection 08/10/2020    Priority: Medium     History of peptic ulcer 08/10/2020    Priority: Medium    Adjustment reaction with anxiety 06/26/2017    Priority: Medium    Chronic migraine 12/28/2016    Priority: Medium    Insomnia 11/01/2015    Priority: Medium    Orthostatic hypotension 08/17/2015    Priority: Medium    Chronic constipation 01/02/2022    Priority: Low   TMJ (dislocation of temporomandibular joint) 01/10/2021    Priority: Low   Genetic testing 07/07/2022   Current Meds  Medication Sig   ALPRAZolam  (XANAX ) 0.5 MG tablet Take 1 tablet (0.5 mg total) by mouth daily as needed (panic attack).   cyclobenzaprine  (FLEXERIL ) 10 MG tablet Take 1 tablet (10 mg total) by mouth at bedtime as needed for muscle spasms.   diclofenac  (VOLTAREN ) 75 MG EC tablet Take 1 tablet (75 mg total) by mouth 2 (two) times daily.   fluticasone  (FLONASE ) 50 MCG/ACT nasal spray Place 1 spray into both nostrils as needed for allergies.   Magnesium 400 MG CAPS Take by mouth.   Multiple Vitamin (MULTIVITAMIN WITH MINERALS) TABS tablet Take 1 tablet by mouth daily.   ondansetron  (ZOFRAN ) 4 MG tablet Take 1 tablet (4 mg total) by mouth every 8 (eight) hours as needed for nausea or vomiting.   PARoxetine (PAXIL) 20 MG tablet Take 40 mg by mouth daily.   rizatriptan  (MAXALT -MLT) 10 MG disintegrating tablet Take 1 tablet (10 mg total) by mouth as needed for migraine. May repeat in 2 hours if needed   tirzepatide  (MOUNJARO ) 5 MG/0.5ML Pen Inject 5 mg into the skin once a week.   traZODone  (DESYREL ) 50 MG tablet Take 0.5-1 tablets (25-50 mg total) by mouth at bedtime as needed for sleep.   Allergies: Patient has no known allergies. Family History: Patient family history includes Breast cancer (age of onset: 55) in her paternal aunt; Celiac disease in her sister; Diabetes in her sister; Heart disease in her sister; Hyperlipidemia in her daughter, mother, sister, and another family member; Irritable bowel syndrome in her sister; Spina bifida in her  son. Social History:  Patient  reports that she has never smoked. She has never used smokeless tobacco. She reports that she does not drink alcohol and does not use drugs.  Review of Systems: Constitutional: Negative for fever malaise or anorexia Cardiovascular: negative for chest pain Respiratory: negative for SOB or persistent cough Gastrointestinal: negative for abdominal pain  Objective  Vitals: BP 101/69   Pulse 73   Temp 98.2 F (36.8 C)   Ht 5' 6 (1.676 m)   Wt 161 lb 12.8 oz (73.4 kg)   SpO2 98%   BMI 26.12 kg/m  General: no acute distress , A&Ox3 Psych; nl affect Commons side effects, risks, benefits, and alternatives for medications and treatment plan prescribed today were discussed, and the patient expressed understanding of the given instructions. Patient is instructed to call or message via MyChart if he/she has any questions or concerns regarding our treatment plan. No barriers  to understanding were identified. We discussed Red Flag symptoms and signs in detail. Patient expressed understanding regarding what to do in case of urgent or emergency type symptoms.  Medication list was reconciled, printed and provided to the patient in AVS. Patient instructions and summary information was reviewed with the patient as documented in the AVS. This note was prepared with assistance of Dragon voice recognition software. Occasional wrong-word or sound-a-like substitutions may have occurred due to the inherent limitations of voice recognition software

## 2023-11-20 ENCOUNTER — Telehealth: Payer: Self-pay | Admitting: Family Medicine

## 2023-11-20 NOTE — Telephone Encounter (Signed)
 [employer] faxed short term disability forms, to be filled out by provider. Patient requested to send it back via Fax within ASAP. Document is located in providers tray at front office.Please advise at [phone].

## 2023-11-20 NOTE — Telephone Encounter (Signed)
 Form placed in provider's tray

## 2023-11-29 DIAGNOSIS — E119 Type 2 diabetes mellitus without complications: Secondary | ICD-10-CM | POA: Diagnosis not present

## 2023-11-29 DIAGNOSIS — H52223 Regular astigmatism, bilateral: Secondary | ICD-10-CM | POA: Diagnosis not present

## 2023-12-23 ENCOUNTER — Other Ambulatory Visit (HOSPITAL_BASED_OUTPATIENT_CLINIC_OR_DEPARTMENT_OTHER): Payer: Self-pay

## 2024-01-16 ENCOUNTER — Other Ambulatory Visit: Payer: Self-pay | Admitting: Family Medicine

## 2024-01-17 ENCOUNTER — Other Ambulatory Visit: Payer: Self-pay

## 2024-01-17 NOTE — Telephone Encounter (Signed)
 11/12/2023 LOV  11/12/2023 fill date  10/0 refills

## 2024-01-18 ENCOUNTER — Other Ambulatory Visit (HOSPITAL_BASED_OUTPATIENT_CLINIC_OR_DEPARTMENT_OTHER): Payer: Self-pay

## 2024-01-21 ENCOUNTER — Other Ambulatory Visit (HOSPITAL_BASED_OUTPATIENT_CLINIC_OR_DEPARTMENT_OTHER): Payer: Self-pay

## 2024-01-21 MED ORDER — ALPRAZOLAM 0.5 MG PO TABS
0.5000 mg | ORAL_TABLET | Freq: Every day | ORAL | 0 refills | Status: AC | PRN
  Filled 2024-01-21: qty 10, 10d supply, fill #0

## 2024-01-25 ENCOUNTER — Other Ambulatory Visit (HOSPITAL_BASED_OUTPATIENT_CLINIC_OR_DEPARTMENT_OTHER): Payer: Self-pay

## 2024-02-18 NOTE — Progress Notes (Unsigned)
 Office Visit Note  Patient: Rachel [name]             Date of Birth: [birth year]           MRN: [id number]             PCP: Jodie Lavern CROME, MD Referring: Jodie Lavern CROME, MD Visit Date: 03/03/2024 Occupation: Data Unavailable  Subjective:  No chief complaint on file.   History of Present Illness: Rachel [name] is a 48 y.o. female ***     Activities of Daily Living:  Patient reports morning stiffness for *** {minute/hour:19697}.   Patient {ACTIONS;DENIES/REPORTS:21021675::Denies} nocturnal pain.  Difficulty dressing/grooming: {ACTIONS;DENIES/REPORTS:21021675::Denies} Difficulty climbing stairs: {ACTIONS;DENIES/REPORTS:21021675::Denies} Difficulty getting out of chair: {ACTIONS;DENIES/REPORTS:21021675::Denies} Difficulty using hands for taps, buttons, cutlery, and/or writing: {ACTIONS;DENIES/REPORTS:21021675::Denies}  No Rheumatology ROS completed.   PMFS History:  Patient Active Problem List   Diagnosis Date Noted   Perimenopausal 08/29/2022   Positive ANA (antinuclear antibody) 07/31/2022   Genetic testing 07/07/2022   Chronic constipation 01/02/2022   TMJ (dislocation of temporomandibular joint) 01/10/2021   Migraine without aura, not intractable, without status migrainosus 12/07/2020   Myalgia due to statin 09/02/2020   Controlled type 2 diabetes mellitus without complication, without long-term current use of insulin  (HCC) 08/10/2020   GERD (gastroesophageal reflux disease) 08/10/2020   History of Helicobacter pylori infection 08/10/2020   History of peptic ulcer 08/10/2020   Adjustment reaction with anxiety 06/26/2017   Chronic migraine 12/28/2016   Insomnia 11/01/2015   Orthostatic hypotension 08/17/2015   Familial hyperlipidemia 08/17/2015    Past Medical History:  Diagnosis Date   Anxiety    Familial hyperlipidemia 08/17/2015   Gallstones    Menorrhagia with regular cycle 12/07/2020   11/2020 - perimenopausal w/ occ missed cycles and at times  heavy bleeding with IDA, improved with iron  therapy. 2023: pt deferred TVUS or further eval. Treated with provera  10x10d.  HX: had regular cycles at times heavy. Had menorrhagia in 2017; reviewed notes and transvag US . No fibroids and normal endometrium at that time. Had single ovarian cyst. Pt is overdue for pap smear. She reports adverse   Migraine without aura, not intractable, without status migrainosus 12/07/2020   Migraines    Orthostatic hypotension 08/17/2015   Prediabetes    PUD (peptic ulcer disease)     Family History  Problem Relation Age of Onset   Hyperlipidemia Mother    Heart disease Sister    Hyperlipidemia Sister    Diabetes Sister    Irritable bowel syndrome Sister    Celiac disease Sister    Breast cancer Paternal Aunt 56       bilateral; d. 59s   Hyperlipidemia Daughter    Spina bifida Son    Hyperlipidemia Other    Cancer Neg Hx    Past Surgical History:  Procedure Laterality Date   CESAREAN SECTION     CHOLECYSTECTOMY N/A 10/18/2021   Procedure: LAPAROSCOPIC CHOLECYSTECTOMY;  Surgeon: Sebastian Moles, MD;  Location: [facility];  Service: General;  Laterality: N/A;   Social History   Tobacco Use   Smoking status: Never   Smokeless tobacco: Never  Vaping Use   Vaping status: Never Used  Substance Use Topics   Alcohol use: No   Drug use: No   Social History   Social History Narrative   Lives with her husband and their 2 children   Her son is wheelchair bound due to spina bifida     Immunization History  Administered Date(s) Administered   Influenza,inj,Quad  PF,6+ Mos 12/27/2019, 12/21/2020, 12/16/2021, 11/26/2023   Influenza-Unspecified 12/13/2015, 12/06/2017, 12/09/2018, 11/23/2022   PFIZER(Purple Top)SARS-COV-2 Vaccination 08/24/2019, 09/12/2019   Tdap 01/17/2014     Objective: Vital Signs: There were no vitals taken for this visit.   Physical Exam   Musculoskeletal Exam: ***  CDAI Exam: CDAI Score: -- Patient Global: --; Provider Global:  -- Swollen: --; Tender: -- Joint Exam 03/03/2024   No joint exam has been documented for this visit   There is currently no information documented on the homunculus. Go to the Rheumatology activity and complete the homunculus joint exam.  Investigation: No additional findings.  Imaging: No results found.  Recent Labs: Lab Results  Component Value Date   WBC 4.3 07/23/2022   HGB 13.0 07/23/2022   PLT 275.0 07/23/2022   NA 139 08/20/2023   K 4.4 08/20/2023   CL 102 08/20/2023   CO2 29 08/20/2023   GLUCOSE 83 08/20/2023   BUN 10 08/20/2023   CREATININE 0.44 08/20/2023   BILITOT 0.9 08/20/2023   ALKPHOS 57 08/20/2023   AST 14 08/20/2023   ALT 10 08/20/2023   PROT 7.5 08/20/2023   ALBUMIN 4.5 08/20/2023   CALCIUM  9.4 08/20/2023   GFRAA >60 11/26/2019   September 16, 2023 ANA 1: 40 NS, sed rate 12, CRP<1.0, RF negative, CK 62, TSH 1.07, vitamin D37.48 September 06, 2023 Urine protein creatinine ratio normal  Speciality Comments: No specialty comments available.  Procedures:  No procedures performed Allergies: Patient has no known allergies.   Assessment / Plan:     Visit Diagnoses: Polyarthralgia  Positive ANA (antinuclear antibody)  Myalgia due to statin  Controlled type 2 diabetes mellitus without complication, without long-term current use of insulin  (HCC)  Familial hyperlipidemia  Gastroesophageal reflux disease without esophagitis  History of Helicobacter pylori infection  History of peptic ulcer  Chronic constipation  Orthostatic hypotension  Other insomnia  Migraine without aura, not intractable, without status migrainosus  Adjustment reaction with anxiety  Orders: No orders of the defined types were placed in this encounter.  No orders of the defined types were placed in this encounter.   Face-to-face time spent with patient was *** minutes. Greater than 50% of time was spent in counseling and coordination of care.  Follow-Up Instructions: No  follow-ups on file.   Maya Nash, MD  Note - This record has been created using Animal nutritionist.  Chart creation errors have been sought, but may not always  have been located. Such creation errors do not reflect on  the standard of medical care.

## 2024-03-03 ENCOUNTER — Ambulatory Visit: Attending: Rheumatology | Admitting: Rheumatology

## 2024-03-03 ENCOUNTER — Encounter: Payer: Self-pay | Admitting: Rheumatology

## 2024-03-03 VITALS — BP 99/68 | HR 83 | Temp 97.6°F | Resp 15 | Ht 66.0 in | Wt 165.8 lb

## 2024-03-03 DIAGNOSIS — E7849 Other hyperlipidemia: Secondary | ICD-10-CM

## 2024-03-03 DIAGNOSIS — M791 Myalgia, unspecified site: Secondary | ICD-10-CM | POA: Diagnosis not present

## 2024-03-03 DIAGNOSIS — F4322 Adjustment disorder with anxiety: Secondary | ICD-10-CM

## 2024-03-03 DIAGNOSIS — G8929 Other chronic pain: Secondary | ICD-10-CM

## 2024-03-03 DIAGNOSIS — G4709 Other insomnia: Secondary | ICD-10-CM

## 2024-03-03 DIAGNOSIS — G43009 Migraine without aura, not intractable, without status migrainosus: Secondary | ICD-10-CM

## 2024-03-03 DIAGNOSIS — K9041 Non-celiac gluten sensitivity: Secondary | ICD-10-CM

## 2024-03-03 DIAGNOSIS — R5383 Other fatigue: Secondary | ICD-10-CM | POA: Diagnosis not present

## 2024-03-03 DIAGNOSIS — T466X5A Adverse effect of antihyperlipidemic and antiarteriosclerotic drugs, initial encounter: Secondary | ICD-10-CM

## 2024-03-03 DIAGNOSIS — M2550 Pain in unspecified joint: Secondary | ICD-10-CM | POA: Diagnosis not present

## 2024-03-03 DIAGNOSIS — Z8619 Personal history of other infectious and parasitic diseases: Secondary | ICD-10-CM

## 2024-03-03 DIAGNOSIS — R7689 Other specified abnormal immunological findings in serum: Secondary | ICD-10-CM

## 2024-03-03 DIAGNOSIS — M25511 Pain in right shoulder: Secondary | ICD-10-CM | POA: Diagnosis not present

## 2024-03-03 DIAGNOSIS — K5909 Other constipation: Secondary | ICD-10-CM

## 2024-03-03 DIAGNOSIS — M7918 Myalgia, other site: Secondary | ICD-10-CM

## 2024-03-03 DIAGNOSIS — E119 Type 2 diabetes mellitus without complications: Secondary | ICD-10-CM

## 2024-03-03 DIAGNOSIS — Z8711 Personal history of peptic ulcer disease: Secondary | ICD-10-CM

## 2024-03-03 DIAGNOSIS — M357 Hypermobility syndrome: Secondary | ICD-10-CM

## 2024-03-03 DIAGNOSIS — K582 Mixed irritable bowel syndrome: Secondary | ICD-10-CM | POA: Diagnosis not present

## 2024-03-03 DIAGNOSIS — I951 Orthostatic hypotension: Secondary | ICD-10-CM

## 2024-03-03 DIAGNOSIS — K219 Gastro-esophageal reflux disease without esophagitis: Secondary | ICD-10-CM

## 2024-03-05 ENCOUNTER — Ambulatory Visit: Payer: Self-pay | Admitting: Rheumatology

## 2024-03-05 LAB — CARDIOLIPIN ANTIBODIES, IGG, IGM, IGA
Anticardiolipin IgA: <2 [APL'U]/mL (ref <20.0)
Anticardiolipin IgG: <2 [GPL'U]/mL (ref <20.0)
Anticardiolipin IgM: <2 [MPL'U]/mL (ref <20.0)

## 2024-03-05 LAB — BETA-2 GLYCOPROTEIN ANTIBODIES
Beta-2 Glyco 1 IgA: <2 U/mL (ref <20.0)
Beta-2 Glyco 1 IgM: 4.6 U/mL (ref <20.0)
Beta-2 Glyco I IgG: <2 U/mL (ref <20.0)

## 2024-03-05 LAB — C3 AND C4
C3 Complement: 139 mg/dL (ref 83–193)
C4 Complement: 27 mg/dL (ref 15–57)

## 2024-03-05 LAB — SEDIMENTATION RATE: Sed Rate: 11 mm/h (ref 0–20)

## 2024-03-05 LAB — RNP ANTIBODY: Ribonucleic Protein(ENA) Antibody, IgG: <1 AI

## 2024-03-05 LAB — ANTI-SMITH ANTIBODY: ENA SM Ab Ser-aCnc: <1 AI

## 2024-03-05 LAB — SJOGRENS SYNDROME-A EXTRACTABLE NUCLEAR ANTIBODY: SSA (Ro) (ENA) Antibody, IgG: >8 AI — AB

## 2024-03-05 LAB — ANTI-DNA ANTIBODY, DOUBLE-STRANDED: ds DNA Ab: 12 [IU]/mL — ABNORMAL HIGH

## 2024-03-05 LAB — SJOGRENS SYNDROME-B EXTRACTABLE NUCLEAR ANTIBODY: SSB (La) (ENA) Antibody, IgG: <1 AI

## 2024-03-05 LAB — ANTI-SCLERODERMA ANTIBODY: Scleroderma (Scl-70) (ENA) Antibody, IgG: <1 AI

## 2024-03-05 NOTE — Progress Notes (Signed)
 Double-stranded DNA and SSA antibodies are positive.  All other labs are negative.  These labs are associated with autoimmune disease.  I will discuss results at the follow-up visit.

## 2024-03-09 ENCOUNTER — Encounter: Payer: Self-pay | Admitting: Family Medicine

## 2024-03-09 ENCOUNTER — Ambulatory Visit: Admitting: Family Medicine

## 2024-03-09 VITALS — BP 110/62 | HR 82 | Temp 97.6°F | Resp 16 | Ht 66.0 in | Wt 163.4 lb

## 2024-03-09 DIAGNOSIS — M2550 Pain in unspecified joint: Secondary | ICD-10-CM | POA: Diagnosis not present

## 2024-03-09 DIAGNOSIS — T466X5A Adverse effect of antihyperlipidemic and antiarteriosclerotic drugs, initial encounter: Secondary | ICD-10-CM | POA: Diagnosis not present

## 2024-03-09 DIAGNOSIS — E7849 Other hyperlipidemia: Secondary | ICD-10-CM

## 2024-03-09 DIAGNOSIS — Z7985 Long-term (current) use of injectable non-insulin antidiabetic drugs: Secondary | ICD-10-CM

## 2024-03-09 DIAGNOSIS — M791 Myalgia, unspecified site: Secondary | ICD-10-CM

## 2024-03-09 DIAGNOSIS — E119 Type 2 diabetes mellitus without complications: Secondary | ICD-10-CM

## 2024-03-09 DIAGNOSIS — F4322 Adjustment disorder with anxiety: Secondary | ICD-10-CM | POA: Diagnosis not present

## 2024-03-09 LAB — CBC WITH DIFFERENTIAL/PLATELET
Basophils Absolute: 0 K/uL (ref 0.0–0.1)
Basophils Relative: 0.8 % (ref 0.0–3.0)
Eosinophils Absolute: 0.1 K/uL (ref 0.0–0.7)
Eosinophils Relative: 2.2 % (ref 0.0–5.0)
HCT: 39.5 % (ref 36.0–46.0)
Hemoglobin: 13.4 g/dL (ref 12.0–15.0)
Lymphocytes Relative: 47.8 % — ABNORMAL HIGH (ref 12.0–46.0)
Lymphs Abs: 1.9 K/uL (ref 0.7–4.0)
MCHC: 34 g/dL (ref 30.0–36.0)
MCV: 83.3 fl (ref 78.0–100.0)
Monocytes Absolute: 0.3 K/uL (ref 0.1–1.0)
Monocytes Relative: 7.6 % (ref 3.0–12.0)
Neutro Abs: 1.7 K/uL (ref 1.4–7.7)
Neutrophils Relative %: 41.6 % — ABNORMAL LOW (ref 43.0–77.0)
Platelets: 281 K/uL (ref 150.0–400.0)
RBC: 4.74 Mil/uL (ref 3.87–5.11)
RDW: 13.3 % (ref 11.5–15.5)
WBC: 4.1 K/uL (ref 4.0–10.5)

## 2024-03-09 LAB — LIPID PANEL
Cholesterol: 326 mg/dL — ABNORMAL HIGH (ref 28–200)
HDL: 52.2 mg/dL
LDL Cholesterol: 253 mg/dL — ABNORMAL HIGH (ref 10–99)
NonHDL: 274
Total CHOL/HDL Ratio: 6
Triglycerides: 106 mg/dL (ref 10.0–149.0)
VLDL: 21.2 mg/dL (ref 0.0–40.0)

## 2024-03-09 LAB — HEMOGLOBIN A1C: Hgb A1c MFr Bld: 5.3 % (ref 4.6–6.5)

## 2024-03-09 NOTE — Patient Instructions (Signed)
 Please return in June 2026 for CPE  I will release your lab results to you on your MyChart account with further instructions. You may see the results before I do, but when I review them I will send you a message with my report or have my assistant call you if things need to be discussed. Please reply to my message with any questions. Thank you!   If you have any questions or concerns, please don't hesitate to send me a message via MyChart or call the office at [phone]. Thank you for visiting with us  today! It's our pleasure caring for you.

## 2024-03-09 NOTE — Progress Notes (Signed)
 "  Subjective  CC:  Chief Complaint  Patient presents with   Follow-up    No questions or concerns.     HPI: Rachel [name] is a 48 y.o. female who presents to the office today for follow up of diabetes and problems listed above in the chief complaint.  Discussed the use of AI scribe software for clinical note transcription with the patient, who gave verbal consent to proceed.  History of Present Illness Rachel [name] is a 48 year old female who presents for follow-up of anxiety and autoimmune disease management and well controlled diabetes  Anxiety symptoms - Anxiety symptoms have improved with current medication regimen - Able to drive on the highway and experiences less irritability in stressful situations - No recent panic attacks - Used Xanax  a couple of times following the death of her nephew, which was helpful - Currently taking Paxil 40 mg daily - Tolerating Paxil well except for some upset stomach, uncertain if related to Paxil or intermittent ibuprofen  use - coping much better with stressors; calmer over all.   Diabetes, mild and well controlled w/o complication - Currently taking Mounjaro  5 mg - Mounjaro  has helped maintain stability;weight is stable - No recent A1c test; due for repeat testing - Diet is contributing positively to overall health - no complications - eats clean - h/o hyperlipidemai intolerant to statins. Fasting today for recheck  Musculoskeletal symptoms - Persistent joint aches and stiffness - No improvement in joint symptoms with GLP-1 medication - reviewed recent rheum eval: + sjogrens ab and dsDNA ab. To discuss in f/u 1/15.     Wt Readings from Last 3 Encounters:  03/09/24 163 lb 6.4 oz (74.1 kg)  03/03/24 165 lb 12.8 oz (75.2 kg)  11/12/23 161 lb 12.8 oz (73.4 kg)    BP Readings from Last 3 Encounters:  03/09/24 110/62  03/03/24 99/68  11/12/23 101/69    Assessment  1. Controlled type 2 diabetes mellitus without complication,  without long-term current use of insulin  (HCC)   2. Familial hyperlipidemia   3. Myalgia due to statin   4. Adjustment reaction with anxiety   5. Polyarthralgia      Plan  Assessment and Plan Assessment & Plan Adjustment disorder with anxiety Improvement in symptoms with current medication regimen. No recent panic attacks. Occasional use of Xanax  during periods of increased stress, such as the recent death of her nephew, which was helpful. Reports some stomach upset, possibly related to medication or ibuprofen  use. - Continue current medication regimen. Paxil 40 and rare xanax  - Refill Paxil as needed.  Type 2 diabetes mellitus Currently on Mounjaro  5 mg with stable blood glucose levels. No recent A1c test performed. - Ordered A1c test. - continue diet eye care foot care. Normotensive. No ace.   Familial hyperlipidemia Recheck fasting lipids today.  Autoimmune disorder possible by lab review and polyarthralgia. Will f/u with rheum. Prn nsaids now.     Follow up: June 2026 for cpe Orders Placed This Encounter  Procedures   CBC with Differential/Platelet   Lipid panel   Hemoglobin A1c   No orders of the defined types were placed in this encounter.     Immunization History  Administered Date(s) Administered   Dtap, Unspecified 10/06/1976, 12/07/1976, 01/31/1977, 03/02/1978   HIB, Unspecified 10/06/1976, 12/07/1976, 03/02/1978   Hep B, Unspecified 04/14/1976, 09/02/1976, 01/31/1977, 03/02/1978   Influenza,Quad,Nasal, Live 12/17/2012   Influenza,inj,Quad PF,6+ Mos 12/27/2019, 12/21/2020, 12/16/2021, 11/26/2023   Influenza,inj,quad, With Preservative 01/01/2014   Influenza-Unspecified 12/13/2015,  12/06/2017, 12/09/2018, 11/23/2022   MMR 09/30/1977, 09/21/1980   PFIZER(Purple Top)SARS-COV-2 Vaccination 08/24/2019, 09/12/2019   Pneumococcal-Unspecified 10/06/1976, 12/07/1976, 09/30/1977, 03/03/1979   Polio, Unspecified 10/06/1976, 12/07/1976, 03/02/1978, 03/03/1979   Td  (Adult),unspecified 11/17/2000   Tdap 08/02/1988, 01/17/2014   Varicella 09/30/1977, 09/21/1980    Diabetes Related Lab Review: Lab Results  Component Value Date   HGBA1C 5.6 05/24/2023   HGBA1C 5.7 07/23/2022   HGBA1C 5.7 02/28/2022    Lab Results  Component Value Date   MICROALBUR 1.7 09/06/2023   Lab Results  Component Value Date   CREATININE 0.44 08/20/2023   BUN 10 08/20/2023   NA 139 08/20/2023   K 4.4 08/20/2023   CL 102 08/20/2023   CO2 29 08/20/2023   Lab Results  Component Value Date   CHOL 345 (H) 02/05/2023   CHOL 251 (H) 02/28/2022   CHOL 138 12/21/2020   Lab Results  Component Value Date   HDL 49.90 02/05/2023   HDL 54.30 02/28/2022   HDL 53.10 12/21/2020   Lab Results  Component Value Date   LDLCALC 265 (H) 02/05/2023   LDLCALC 172 (H) 02/28/2022   LDLCALC 63 12/21/2020   Lab Results  Component Value Date   TRIG 151.0 (H) 02/05/2023   TRIG 122.0 02/28/2022   TRIG 109.0 12/21/2020   Lab Results  Component Value Date   CHOLHDL 7 02/05/2023   CHOLHDL 5 02/28/2022   CHOLHDL 3 12/21/2020   No results found for: LDLDIRECT The ASCVD Risk score (Arnett DK, et al., 2019) failed to calculate for the following reasons:   According to ACC/AHA guidelines, patients with an LDL-C level greater than 190 mg/dL (5.08 mmol/L) should be considered for high-intensity statin therapy. This patient's most recent LDL-C level is 265 mg/dL. I have reviewed the PMH, Fam and Soc history. Patient Active Problem List   Diagnosis Date Noted Date Diagnosed   Myalgia due to statin 09/02/2020     Priority: High   Controlled type 2 diabetes mellitus without complication, without long-term current use of insulin  (HCC) 08/10/2020     Priority: High    Elevated A1c with Dr. Melonie. Has been on metformin  and mounjaro . Improved with weight loss and diet.    Familial hyperlipidemia 08/17/2015     Priority: High    Repatha ; statin intolerant Lipid clinic, pt declines  medications at this time. 08/2023    Perimenopausal 08/29/2022     Priority: Medium    Positive ANA (antinuclear antibody) 07/31/2022     Priority: Medium     Low titre 1:40, nuclear speckled, nl esr.  Ordered due to hand/foot parasthesias.    Migraine without aura, not intractable, without status migrainosus 12/07/2020     Priority: Medium    GERD (gastroesophageal reflux disease) 08/10/2020     Priority: Medium    History of Helicobacter pylori infection 08/10/2020     Priority: Medium    History of peptic ulcer 08/10/2020     Priority: Medium    Adjustment reaction with anxiety 06/26/2017     Priority: Medium     Responsive to lexapro  10, sept 2024; did not respond to buspar , but lexapro  causes some am nausea    Chronic migraine 12/28/2016     Priority: Medium     Referred to neurology. Did not tolerate topiramate .    Insomnia 11/01/2015     Priority: Medium    Orthostatic hypotension 08/17/2015     Priority: Medium    Chronic constipation 01/02/2022  Priority: Low    Resolved with gluten free and lactose free diet 2023    TMJ (dislocation of temporomandibular joint) 01/10/2021     Priority: Low   Genetic testing 07/07/2022     Negative [facility] CancerNext-Expanded +RNAinsight.  Report date is 05/29/2022.   The CancerNext-Expanded gene panel offered by [facility] and includes sequencing, rearrangement, and RNA analysis for the following 71 genes:  AIP, ALK, APC, ATM, BAP1, BARD1, BMPR1A, BRCA1, BRCA2, BRIP1, CDC73, CDH1, CDK4, CDKN1B, CDKN2A, CHEK2, DICER1, FH, FLCN, KIF1B, LZTR1,MAX, MEN1, MET, MLH1, MSH2, MSH6, MUTYH, NF1, NF2, NTHL1, PALB2, PHOX2B, PMS2, POT1, PRKAR1A, PTCH1, PTEN, RAD51C,RAD51D, RB1, RET, SDHA, SDHAF2, SDHB, SDHC, SDHD, SMAD4, SMARCA4, SMARCB1, SMARCE1, STK11, SUFU, TMEM127, TP53,TSC1, TSC2 and VHL (sequencing and deletion/duplication); AXIN2, CTNNA1, EGFR, EGLN1, HOXB13, KIT, MITF, MSH3, PDGFRA, POLD1 and POLE (sequencing only); EPCAM and GREM1  (deletion/duplication only).       Social History: Patient  reports that she has never smoked. She has been exposed to tobacco smoke. She has never used smokeless tobacco. She reports that she does not drink alcohol and does not use drugs.  Review of Systems: Ophthalmic: negative for eye pain, loss of vision or double vision Cardiovascular: negative for chest pain Respiratory: negative for SOB or persistent cough Gastrointestinal: negative for abdominal pain Genitourinary: negative for dysuria or gross hematuria MSK: negative for foot lesions Neurologic: negative for weakness or gait disturbance  Objective  Vitals: BP 110/62   Pulse 82   Temp 97.6 F (36.4 C) (Temporal)   Resp 16   Ht 5' 6 (1.676 m)   Wt 163 lb 6.4 oz (74.1 kg)   LMP 08/28/2023   SpO2 97%   BMI 26.37 kg/m  General: well appearing, no acute distress  Psych:  Alert and oriented, normal mood and affect   Diabetic education: ongoing education regarding chronic disease management for diabetes was given today. We continue to reinforce the ABC's of diabetic management: A1c (<7 or 8 dependent upon patient), tight blood pressure control, and cholesterol management with goal LDL < 100 minimally. We discuss diet strategies, exercise recommendations, medication options and possible side effects. At each visit, we review recommended immunizations and preventive care recommendations for diabetics and stress that good diabetic control can prevent other problems. See below for this patient's data. Commons side effects, risks, benefits, and alternatives for medications and treatment plan prescribed today were discussed, and the patient expressed understanding of the given instructions. Patient is instructed to call or message via MyChart if he/she has any questions or concerns regarding our treatment plan. No barriers to understanding were identified. We discussed Red Flag symptoms and signs in detail. Patient expressed  understanding regarding what to do in case of urgent or emergency type symptoms.  Medication list was reconciled, printed and provided to the patient in AVS. Patient instructions and summary information was reviewed with the patient as documented in the AVS. This note was prepared with assistance of Dragon voice recognition software. Occasional wrong-word or sound-a-like substitutions may have occurred due to the inherent limitations of voice recognition software   "

## 2024-03-13 ENCOUNTER — Ambulatory Visit: Payer: Self-pay | Admitting: Family Medicine

## 2024-03-13 NOTE — Progress Notes (Signed)
 See mychart note Emmie! Your cholesterol is very elevated as you know. This is genetic and in spite of your healthy diet, your body still produces too much cholesterol.  Please consider treatment again. Your A1c looks good though! Sincerely, Dr. Jodie

## 2024-03-20 NOTE — Progress Notes (Unsigned)
 "  Office Visit Note  Patient: Cristyn Crossno             Date of Birth: Dec 10, 1975           MRN: [id number]             PCP: Jodie Lavern CROME, MD Referring: Jodie Lavern CROME, MD Visit Date: 04/02/2024 Occupation: Data Unavailable  Subjective:  No chief complaint on file.   History of Present Illness: Takeyla Kindall is a 49 y.o. female ***     Activities of Daily Living:  Patient reports morning stiffness for *** {minute/hour:19697}.   Patient {ACTIONS;DENIES/REPORTS:21021675::Denies} nocturnal pain.  Difficulty dressing/grooming: {ACTIONS;DENIES/REPORTS:21021675::Denies} Difficulty climbing stairs: {ACTIONS;DENIES/REPORTS:21021675::Denies} Difficulty getting out of chair: {ACTIONS;DENIES/REPORTS:21021675::Denies} Difficulty using hands for taps, buttons, cutlery, and/or writing: {ACTIONS;DENIES/REPORTS:21021675::Denies}  No Rheumatology ROS completed.   PMFS History:  Patient Active Problem List   Diagnosis Date Noted   Perimenopausal 08/29/2022   Positive ANA (antinuclear antibody) 07/31/2022   Genetic testing 07/07/2022   Chronic constipation 01/02/2022   TMJ (dislocation of temporomandibular joint) 01/10/2021   Migraine without aura, not intractable, without status migrainosus 12/07/2020   Myalgia due to statin 09/02/2020   Controlled type 2 diabetes mellitus without complication, without long-term current use of insulin  (HCC) 08/10/2020   GERD (gastroesophageal reflux disease) 08/10/2020   History of Helicobacter pylori infection 08/10/2020   History of peptic ulcer 08/10/2020   Adjustment reaction with anxiety 06/26/2017   Chronic migraine 12/28/2016   Insomnia 11/01/2015   Orthostatic hypotension 08/17/2015   Familial hyperlipidemia 08/17/2015    Past Medical History:  Diagnosis Date   Anxiety    Familial hyperlipidemia 08/17/2015   Gallstones    Menorrhagia with regular cycle 12/07/2020   11/2020 - perimenopausal w/ occ missed cycles and at times  heavy bleeding with IDA, improved with iron  therapy. 2023: pt deferred TVUS or further eval. Treated with provera  10x10d.  HX: had regular cycles at times heavy. Had menorrhagia in 2017; reviewed notes and transvag US . No fibroids and normal endometrium at that time. Had single ovarian cyst. Pt is overdue for pap smear. She reports adverse   Migraine without aura, not intractable, without status migrainosus 12/07/2020   Migraines    Orthostatic hypotension 08/17/2015   Prediabetes    PUD (peptic ulcer disease)     Family History  Problem Relation Age of Onset   Hyperlipidemia Mother    COPD Father    Heart disease Sister    Hyperlipidemia Sister    Diabetes Sister    Irritable bowel syndrome Sister    Celiac disease Sister    Heart disease Brother    Breast cancer Paternal Aunt 42       bilateral; d. 63s   Hyperlipidemia Daughter    Spina bifida Son    Hyperlipidemia Other    Cancer Neg Hx    Past Surgical History:  Procedure Laterality Date   CESAREAN SECTION     CHOLECYSTECTOMY N/A 10/18/2021   Procedure: LAPAROSCOPIC CHOLECYSTECTOMY;  Surgeon: Sebastian Moles, MD;  Location: [facility] OR;  Service: General;  Laterality: N/A;   Social History[1] Social History   Social History Narrative   Lives with her husband and their 2 children   Her son is wheelchair bound due to spina bifida     Immunization History  Administered Date(s) Administered   Dtap, Unspecified 10/06/1976, 12/07/1976, 01/31/1977, 03/02/1978   HIB, Unspecified 10/06/1976, 12/07/1976, 03/02/1978   Hep B, Unspecified November [birth year] 1976, 09/02/1976, 01/31/1977, 03/02/1978   Influenza,Quad,Nasal, Live 12/17/2012  Influenza,inj,Quad PF,6+ Mos 12/27/2019, 12/21/2020, 12/16/2021, 11/26/2023   Influenza,inj,quad, With Preservative 01/01/2014   Influenza-Unspecified 12/13/2015, 12/06/2017, 12/09/2018, 11/23/2022   MMR 09/30/1977, 09/21/1980   PFIZER(Purple Top)SARS-COV-2 Vaccination 08/24/2019, 09/12/2019    Pneumococcal-Unspecified 10/06/1976, 12/07/1976, 09/30/1977, 03/03/1979   Polio, Unspecified 10/06/1976, 12/07/1976, 03/02/1978, 03/03/1979   Td (Adult),unspecified 11/17/2000   Tdap 08/02/1988, 01/17/2014   Varicella 09/30/1977, 09/21/1980     Objective: Vital Signs: LMP 08/28/2023    Physical Exam   Musculoskeletal Exam: ***  CDAI Exam: CDAI Score: -- Patient Global: --; Provider Global: -- Swollen: --; Tender: -- Joint Exam 04/02/2024   No joint exam has been documented for this visit   There is currently no information documented on the homunculus. Go to the Rheumatology activity and complete the homunculus joint exam.  Investigation: No additional findings.  Imaging: No results found.  Recent Labs: Lab Results  Component Value Date   WBC 4.1 03/09/2024   HGB 13.4 03/09/2024   PLT 281.0 03/09/2024   NA 139 08/20/2023   K 4.4 08/20/2023   CL 102 08/20/2023   CO2 29 08/20/2023   GLUCOSE 83 08/20/2023   BUN 10 08/20/2023   CREATININE 0.44 08/20/2023   BILITOT 0.9 08/20/2023   ALKPHOS 57 08/20/2023   AST 14 08/20/2023   ALT 10 08/20/2023   PROT 7.5 08/20/2023   ALBUMIN 4.5 08/20/2023   CALCIUM  9.4 08/20/2023   GFRAA >60 11/26/2019  And September 06, 2023 protein creatinine ratio normal, ANA 1: 40 NS March 03, 2024 dsDNA 12, anti-SSA>8.0 (SSB, Smith, RNP, SCL 70 negative), C3-C4 normal, sed rate normal, anticardiolipin negative, beta-2  GP 1 negative March 09, 2024 LDL 253, hemoglobin A1c 5.3  Speciality Comments: No specialty comments available.  Procedures:  No procedures performed Allergies: Patient has no known allergies.   Assessment / Plan:     Visit Diagnoses: No diagnosis found.  Orders: No orders of the defined types were placed in this encounter.  No orders of the defined types were placed in this encounter.   Face-to-face time spent with patient was *** minutes. Greater than 50% of time was spent in counseling and coordination of  care.  Follow-Up Instructions: No follow-ups on file.   Maya Nash, MD  Note - This record has been created using Animal nutritionist.  Chart creation errors have been sought, but may not always  have been located. Such creation errors do not reflect on  the standard of medical care.    [1]  Social History Tobacco Use   Smoking status: Never    Passive exposure: Past   Smokeless tobacco: Never  Vaping Use   Vaping status: Never Used  Substance Use Topics   Alcohol use: No   Drug use: No   "

## 2024-03-23 ENCOUNTER — Encounter: Payer: Self-pay | Admitting: Family Medicine

## 2024-03-23 MED ORDER — PAROXETINE HCL 40 MG PO TABS
40.0000 mg | ORAL_TABLET | Freq: Every day | ORAL | 3 refills | Status: AC
  Filled 2024-03-23: qty 90, 90d supply, fill #0

## 2024-03-23 NOTE — Progress Notes (Signed)
 "  Office Visit Note  Patient: Rachel [name]             Date of Birth: 01-29-1976           MRN: [id number]             PCP: Jodie Lavern CROME, MD Referring: Jodie Lavern CROME, MD Visit Date: 03/24/2024 Occupation: Data Unavailable  Subjective:  Joint pain and fatigue  History of Present Illness: Rachel [name] is a 49 y.o. female with polyarthralgia and positive ANA.  She returns today after initial evaluation on March 03, 2024.  She continues to have pain and discomfort in multiple joints for the last several years.  There is no history of joint swelling.  She gives history of fatigue, dry eyes, arthralgias and myalgias.  No history of Raynauds, hair loss, photosensitivity, malar rash, lymphadenopathy.  There is no history of shortness of breath.    Activities of Daily Living:  Patient reports morning stiffness for 30-60 minutes.   Patient Reports nocturnal pain.  Difficulty dressing/grooming: Denies Difficulty climbing stairs: Denies Difficulty getting out of chair: Denies Difficulty using hands for taps, buttons, cutlery, and/or writing: Denies  Review of Systems  Constitutional:  Positive for fatigue.  HENT:  Negative for mouth sores and mouth dryness.   Eyes:  Positive for dryness.  Respiratory:  Negative for shortness of breath.   Cardiovascular:  Negative for chest pain and palpitations.  Gastrointestinal:  Positive for constipation and diarrhea. Negative for blood in stool.  Endocrine: Negative for increased urination.  Genitourinary:  Negative for involuntary urination.  Musculoskeletal:  Positive for joint pain, joint pain, myalgias, morning stiffness, muscle tenderness and myalgias. Negative for gait problem, joint swelling and muscle weakness.  Skin:  Negative for color change, rash, hair loss and sensitivity to sunlight.  Allergic/Immunologic: Positive for susceptible to infections.  Neurological:  Positive for headaches. Negative for dizziness.  Hematological:   Negative for swollen glands.  Psychiatric/Behavioral:  Positive for sleep disturbance. Negative for depressed mood. The patient is not nervous/anxious.     PMFS History:  Patient Active Problem List   Diagnosis Date Noted   Perimenopausal 08/29/2022   Positive ANA (antinuclear antibody) 07/31/2022   Genetic testing 07/07/2022   Chronic constipation 01/02/2022   TMJ (dislocation of temporomandibular joint) 01/10/2021   Migraine without aura, not intractable, without status migrainosus 12/07/2020   Myalgia due to statin 09/02/2020   Controlled type 2 diabetes mellitus without complication, without long-term current use of insulin  (HCC) 08/10/2020   GERD (gastroesophageal reflux disease) 08/10/2020   History of Helicobacter pylori infection 08/10/2020   History of peptic ulcer 08/10/2020   Adjustment reaction with anxiety 06/26/2017   Chronic migraine 12/28/2016   Insomnia 11/01/2015   Orthostatic hypotension 08/17/2015   Familial hyperlipidemia 08/17/2015    Past Medical History:  Diagnosis Date   Anxiety    Familial hyperlipidemia 08/17/2015   Gallstones    Menorrhagia with regular cycle 12/07/2020   11/2020 - perimenopausal w/ occ missed cycles and at times heavy bleeding with IDA, improved with iron  therapy. 2023: pt deferred TVUS or further eval. Treated with provera  10x10d.  HX: had regular cycles at times heavy. Had menorrhagia in 2017; reviewed notes and transvag US . No fibroids and normal endometrium at that time. Had single ovarian cyst. Pt is overdue for pap smear. She reports adverse   Migraine without aura, not intractable, without status migrainosus 12/07/2020   Migraines    Orthostatic hypotension 08/17/2015   Prediabetes  PUD (peptic ulcer disease)     Family History  Problem Relation Age of Onset   Hyperlipidemia Mother    COPD Father    Heart disease Sister    Hyperlipidemia Sister    Diabetes Sister    Irritable bowel syndrome Sister    Celiac disease  Sister    Heart disease Brother    Breast cancer Paternal Aunt 42       bilateral; d. 65s   Hyperlipidemia Daughter    Spina bifida Son    Hyperlipidemia Other    Cancer Neg Hx    Past Surgical History:  Procedure Laterality Date   CESAREAN SECTION     CHOLECYSTECTOMY N/A 10/18/2021   Procedure: LAPAROSCOPIC CHOLECYSTECTOMY;  Surgeon: Sebastian Moles, MD;  Location: [facility] OR;  Service: General;  Laterality: N/A;   Social History[1] Social History   Social History Narrative   Lives with her husband and their 2 children   Her son is wheelchair bound due to spina bifida     Immunization History  Administered Date(s) Administered   Dtap, Unspecified 10/06/1976, 12/07/1976, 01/31/1977, 03/02/1978   HIB, Unspecified 10/06/1976, 12/07/1976, 03/02/1978   Hep B, Unspecified 07-28-1976, 09/02/1976, 01/31/1977, 03/02/1978   Influenza,Quad,Nasal, Live 12/17/2012   Influenza,inj,Quad PF,6+ Mos 12/27/2019, 12/21/2020, 12/16/2021, 11/26/2023   Influenza,inj,quad, With Preservative 01/01/2014   Influenza-Unspecified 12/13/2015, 12/06/2017, 12/09/2018, 11/23/2022   MMR 09/30/1977, 09/21/1980   PFIZER(Purple Top)SARS-COV-2 Vaccination 08/24/2019, 09/12/2019   Pneumococcal-Unspecified 10/06/1976, 12/07/1976, 09/30/1977, 03/03/1979   Polio, Unspecified 10/06/1976, 12/07/1976, 03/02/1978, 03/03/1979   Td (Adult),unspecified 11/17/2000   Tdap 08/02/1988, 01/17/2014   Varicella 09/30/1977, 09/21/1980     Objective: Vital Signs: BP 105/74   Pulse 84   Temp 97.6 F (36.4 C)   Resp 14   Ht 5' 6 (1.676 m)   Wt 165 lb 12.8 oz (75.2 kg)   LMP 08/28/2023   BMI 26.76 kg/m    Physical Exam Vitals and nursing note reviewed.  Constitutional:      Appearance: She is well-developed.  HENT:     Head: Normocephalic and atraumatic.  Eyes:     Conjunctiva/sclera: Conjunctivae normal.  Cardiovascular:     Rate and Rhythm: Normal rate and regular rhythm.     Heart sounds: Normal heart sounds.   Pulmonary:     Effort: Pulmonary effort is normal.     Breath sounds: Normal breath sounds.  Abdominal:     General: Bowel sounds are normal.     Palpations: Abdomen is soft.  Musculoskeletal:     Cervical back: Normal range of motion.  Lymphadenopathy:     Cervical: No cervical adenopathy.  Skin:    General: Skin is warm and dry.     Capillary Refill: Capillary refill takes less than 2 seconds.  Neurological:     Mental Status: She is alert and oriented to person, place, and time.  Psychiatric:        Behavior: Behavior normal.      Musculoskeletal Exam: Cervical, thoracic and lumbar spine were in good range of motion.  There was no SI joint tenderness.  Shoulder joints, elbow joints, wrist joints, MCPs, PIPs and DIPs were in good range of motion with no synovitis.  Hip joints and knee joints were in good range of motion without any warmth swelling or effusion.  There was no tenderness over ankles or MTPs.   CDAI Exam: CDAI Score: -- Patient Global: --; Provider Global: -- Swollen: --; Tender: -- Joint Exam 03/24/2024   No joint exam  has been documented for this visit   There is currently no information documented on the homunculus. Go to the Rheumatology activity and complete the homunculus joint exam.  Investigation: No additional findings.  Imaging: No results found.  Recent Labs: Lab Results  Component Value Date   WBC 4.1 03/09/2024   HGB 13.4 03/09/2024   PLT 281.0 03/09/2024   NA 139 08/20/2023   K 4.4 08/20/2023   CL 102 08/20/2023   CO2 29 08/20/2023   GLUCOSE 83 08/20/2023   BUN 10 08/20/2023   CREATININE 0.44 08/20/2023   BILITOT 0.9 08/20/2023   ALKPHOS 57 08/20/2023   AST 14 08/20/2023   ALT 10 08/20/2023   PROT 7.5 08/20/2023   ALBUMIN 4.5 08/20/2023   CALCIUM  9.4 08/20/2023   GFRAA >60 11/26/2019   March 03, 2024 SSA> 8.0, dsDNA 12 (SSB, Smith, RNP, SCL 70 negative), C3-C4 normal, anticardiolipin negative, beta-2  GP 1  negative,  March 09, 2024 LDL 253, hemoglobin A1c 5.3   September 16, 2023 ANA 1: 40 NS, sed rate 12, CRP<1.0, RF negative, CK 62, TSH 1.07, vitamin D37.48 September 06, 2023 Urine protein creatinine ratio normal  Speciality Comments: No specialty comments available.  Procedures:  No procedures performed Allergies: Patient has no known allergies.   Assessment / Plan:     Visit Diagnoses: Positive ANA (antinuclear antibody) - Positive ANA 1: 40 and S, positive double-stranded DNA 12, positive SSA> 8.0, polyarthralgia, fatigue, dry eyes: -I did detailed discussion with the patient regarding the lab results.  Association of positive SSA antibody with Sjogren's, lupus, ILD, lymphoma were discussed.  Association double-stranded ENA with lupus was discussed.  Patient is not very symptomatic and has polyarthralgias, fatigue and dry eyes.  She denies any history of oral ulcers, nasal ulcers, malar rash, photosensitivity, Raynaud's or lymphadenopathy.  I discussed the option of starting her on hydroxychloroquine.  She is concerned about the side effects of hydroxychloroquine and would like to hold off the treatment.  She is also concerned that labs may be an incidental finding.  She would like to have repeat labs.  We discussed repeating labs in 3 months.  I also advised her to use sunscreen on a regular basis and if she develops any symptoms to contact me.  I discussed with her that she meets the criteria for systemic lupus.  Complications of untreated systemic lupus was also discussed.  Plan: Protein / creatinine ratio, urine, CBC with Differential/Platelet, Comprehensive metabolic panel with GFR, Anti-DNA antibody, double-stranded, C3 and C4, Sedimentation rate, ANA, Sjogrens syndrome-A extractable nuclear antibody.  I gave her a handout on Plaquenil to review.  I advised her to get a baseline EKG with her PCP to rule out arrhythmias.  Polyarthralgia-she continues to have joint pain and discomfort.  No synovitis  was noted on the examination.  Chronic right shoulder pain - X-rays of the right shoulder June 2024 were unremarkable.  MRI showed trace subacromial and subdeltoid bursal fluid.  Cortisone injection by Dr. Joane.  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 Joane.  Myalgia due to statin  Other fatigue-she continues to have some fatigue.  Other insomnia-good sleep hygiene was discussed.  Myofascial pain-she continues to have some generalized achiness and positive tender points.  Other medical problems are listed as follows:  Irritable bowel syndrome with both constipation and diarrhea  Migraine without aura, not intractable, without status migrainosus  Controlled type 2 diabetes mellitus without complication, without long-term current use of insulin  (  HCC)  Gastroesophageal reflux disease without esophagitis  History of Helicobacter pylori infection  History of peptic ulcer  NCGS (non-celiac gluten sensitivity)  Orthostatic hypotension  Familial hyperlipidemia  Adjustment reaction with anxiety  Orders: Orders Placed This Encounter  Procedures   Protein / creatinine ratio, urine   CBC with Differential/Platelet   Comprehensive metabolic panel with GFR   Anti-DNA antibody, double-stranded   C3 and C4   Sedimentation rate   ANA   Sjogrens syndrome-A extractable nuclear antibody   No orders of the defined types were placed in this encounter.    Follow-Up Instructions: Return in about 3 months (around 06/22/2024) for +ANA.   Maya Nash, MD  Note - This record has been created using Animal nutritionist.  Chart creation errors have been sought, but may not always  have been located. Such creation errors do not reflect on  the standard of medical care.     [1]  Social History Tobacco Use   Smoking status: Never    Passive exposure: Past   Smokeless tobacco: Never  Vaping Use   Vaping status: Never Used  Substance Use  Topics   Alcohol use: No   Drug use: No   "

## 2024-03-24 ENCOUNTER — Encounter: Payer: Self-pay | Admitting: Rheumatology

## 2024-03-24 ENCOUNTER — Other Ambulatory Visit: Payer: Self-pay

## 2024-03-24 ENCOUNTER — Other Ambulatory Visit (HOSPITAL_BASED_OUTPATIENT_CLINIC_OR_DEPARTMENT_OTHER): Payer: Self-pay

## 2024-03-24 ENCOUNTER — Ambulatory Visit: Attending: Rheumatology | Admitting: Rheumatology

## 2024-03-24 ENCOUNTER — Ambulatory Visit (INDEPENDENT_AMBULATORY_CARE_PROVIDER_SITE_OTHER)

## 2024-03-24 VITALS — BP 105/74 | HR 84 | Temp 97.6°F | Resp 14 | Ht 66.0 in | Wt 165.8 lb

## 2024-03-24 DIAGNOSIS — Z79899 Other long term (current) drug therapy: Secondary | ICD-10-CM

## 2024-03-24 DIAGNOSIS — M2550 Pain in unspecified joint: Secondary | ICD-10-CM | POA: Diagnosis not present

## 2024-03-24 DIAGNOSIS — M791 Myalgia, unspecified site: Secondary | ICD-10-CM | POA: Diagnosis not present

## 2024-03-24 DIAGNOSIS — R7689 Other specified abnormal immunological findings in serum: Secondary | ICD-10-CM

## 2024-03-24 DIAGNOSIS — R5383 Other fatigue: Secondary | ICD-10-CM | POA: Diagnosis not present

## 2024-03-24 DIAGNOSIS — M7918 Myalgia, other site: Secondary | ICD-10-CM | POA: Diagnosis not present

## 2024-03-24 DIAGNOSIS — K582 Mixed irritable bowel syndrome: Secondary | ICD-10-CM | POA: Diagnosis not present

## 2024-03-24 DIAGNOSIS — F4322 Adjustment disorder with anxiety: Secondary | ICD-10-CM

## 2024-03-24 DIAGNOSIS — M25511 Pain in right shoulder: Secondary | ICD-10-CM

## 2024-03-24 DIAGNOSIS — K9041 Non-celiac gluten sensitivity: Secondary | ICD-10-CM

## 2024-03-24 DIAGNOSIS — Z8619 Personal history of other infectious and parasitic diseases: Secondary | ICD-10-CM

## 2024-03-24 DIAGNOSIS — K219 Gastro-esophageal reflux disease without esophagitis: Secondary | ICD-10-CM

## 2024-03-24 DIAGNOSIS — E119 Type 2 diabetes mellitus without complications: Secondary | ICD-10-CM | POA: Diagnosis not present

## 2024-03-24 DIAGNOSIS — E7849 Other hyperlipidemia: Secondary | ICD-10-CM

## 2024-03-24 DIAGNOSIS — M357 Hypermobility syndrome: Secondary | ICD-10-CM

## 2024-03-24 DIAGNOSIS — G4709 Other insomnia: Secondary | ICD-10-CM | POA: Diagnosis not present

## 2024-03-24 DIAGNOSIS — G43009 Migraine without aura, not intractable, without status migrainosus: Secondary | ICD-10-CM | POA: Diagnosis not present

## 2024-03-24 DIAGNOSIS — T466X5A Adverse effect of antihyperlipidemic and antiarteriosclerotic drugs, initial encounter: Secondary | ICD-10-CM

## 2024-03-24 DIAGNOSIS — G8929 Other chronic pain: Secondary | ICD-10-CM

## 2024-03-24 DIAGNOSIS — Z8711 Personal history of peptic ulcer disease: Secondary | ICD-10-CM

## 2024-03-24 DIAGNOSIS — I951 Orthostatic hypotension: Secondary | ICD-10-CM

## 2024-03-24 NOTE — Patient Instructions (Signed)
 Standing Labs We placed an order today for your standing lab work.   Please have your standing labs drawn in  3 months  Please have your labs drawn 2 weeks prior to your appointment so that the provider can discuss your lab results at your appointment, if possible.  Please note that you may see your imaging and lab results in MyChart before we have reviewed them. We will contact you once all results are reviewed. Please allow our office up to 72 hours to thoroughly review all of the results before contacting the office for clarification of your results.  WALK-IN LAB HOURS  Monday through Thursday from 8:00 am - 4:30 pm and Friday from 8:00 am-12:00 pm.  Patients with office visits requiring labs will be seen before walk-in labs.  You may encounter longer than normal wait times. Please allow additional time. Wait times may be shorter on  Monday and Thursday afternoons.  We do not book appointments for walk-in labs. We appreciate your patience and understanding with our staff.   Labs are drawn by [facility]. Please bring your co-pay at the time of your lab draw.  You may receive a bill from [facility] for your lab work.  Please note if you are on Hydroxychloroquine and and an order has been placed for a Hydroxychloroquine level,  you will need to have it drawn 4 hours or more after your last dose.  If you wish to have your labs drawn at another location, please call the office 24 hours in advance so we can fax the orders.  The office is located at [address], [place], [place] [address]   If you have any questions regarding directions or hours of operation,  please call [phone].   As a reminder, please drink plenty of water prior to coming for your lab work. Thanks!   Hydroxychloroquine Tablets What is this medication? HYDROXYCHLOROQUINE (hye drox ee KLOR oh kwin) treats autoimmune conditions, such as rheumatoid arthritis and lupus. It works by slowing down an overactive  immune system. It may also be used to prevent and treat malaria. It works by killing the parasite that causes malaria. It belongs to a group of medications called DMARDs. This medicine may be used for other purposes; ask your health care provider or pharmacist if you have questions. COMMON BRAND NAME(S): Plaquenil, Quineprox, SOVUNA What should I tell my care team before I take this medication? They need to know if you have any of these conditions: Diabetes Eye disease, vision problems Frequently drink alcohol G6PD deficiency Heart disease Irregular heartbeat or rhythm Kidney disease Liver disease Porphyria Psoriasis An unusual or allergic reaction to hydroxychloroquine, other medications, foods, dyes, or preservatives Pregnant or trying to get pregnant Breastfeeding How should I use this medication? Take this medication by mouth with water. Take it as directed on the prescription label. Do not cut, crush, or chew this medication. Swallow the tablets whole. Take it with food. Do not take it more than directed. Take all of this medication unless your care team tells you to stop it early. Keep taking it even if you think you are better. Take products with antacids in them at a different time of day than this medication. Take this medication 4 hours before or 4 hours after antacids. Talk to your care team if you have questions. Talk to your care team about the use of this medication in children. While this medication may be prescribed for selected conditions, precautions do apply. Overdosage: If  you think you have taken too much of this medicine contact a poison control center or emergency room at once. NOTE: This medicine is only for you. Do not share this medicine with others. What if I miss a dose? If you miss a dose, take it as soon as you can. If it is almost time for your next dose, take only that dose. Do not take double or extra doses. What may interact with this medication? Do not  take this medication with any of the following: Cisapride Dronedarone Pimozide Thioridazine This medication may also interact with the following: Ampicillin Antacids Cimetidine Cyclosporine Digoxin Kaolin Medications for diabetes, such as insulin , glipizide, glyburide Medications for seizures, such as carbamazepine, phenobarbital, phenytoin Mefloquine Methotrexate Other medications that cause heart rhythm changes Praziquantel This list may not describe all possible interactions. Give your health care provider a list of all the medicines, herbs, non-prescription drugs, or dietary supplements you use. Also tell them if you smoke, drink alcohol, or use illegal drugs. Some items may interact with your medicine. What should I watch for while using this medication? Visit your care team for regular checks on your progress. Tell your care team if your symptoms do not start to get better or if they get worse. You may need blood work done while you are taking this medication. If you take other medications that can affect heart rhythm, you may need more testing. Talk to your care team if you have questions. Your vision may be tested before and during use of this medication. Tell your care team right away if you have any change in your eyesight. This medication may cause serious skin reactions. They can happen weeks to months after starting the medication. Contact your care team right away if you notice fevers or flu-like symptoms with a rash. The rash may be red or purple and then turn into blisters or peeling of the skin. Or, you might notice a red rash with swelling of the face, lips or lymph nodes in your neck or under your arms. If you or your family notice any changes in your behavior, such as new or worsening depression, thoughts of harming yourself, anxiety, or other unusual or disturbing thoughts, or memory loss, call your care team right away. What side effects may I notice from receiving this  medication? Side effects that you should report to your care team as soon as possible: Allergic reactions--skin rash, itching, hives, swelling of the face, lips, tongue, or throat Aplastic anemia--unusual weakness or fatigue, dizziness, headache, trouble breathing, increased bleeding or bruising Change in vision Heart rhythm changes--fast or irregular heartbeat, dizziness, feeling faint or lightheaded, chest pain, trouble breathing Infection--fever, chills, cough, or sore throat Low blood sugar (hypoglycemia)--tremors or shaking, anxiety, sweating, cold or clammy skin, confusion, dizziness, rapid heartbeat Muscle injury--unusual weakness or fatigue, muscle pain, dark yellow or brown urine, decrease in amount of urine Pain, tingling, or numbness in the hands or feet Rash, fever, and swollen lymph nodes Redness, blistering, peeling, or loosening of the skin, including inside the mouth Thoughts of suicide or self-harm, worsening mood, or feelings of depression Unusual bruising or bleeding Side effects that usually do not require medical attention (report to your care team if they continue or are bothersome): Diarrhea Headache Nausea Stomach pain Vomiting This list may not describe all possible side effects. Call your doctor for medical advice about side effects. You may report side effects to FDA at 1-800-FDA-1088. Where should I keep my medication? Keep out of  the reach of children and pets. Store at room temperature up to 30 degrees C (86 degrees F). Protect from light. Get rid of any unused medication after the expiration date. To get rid of medications that are no longer needed or have expired: Take the medication to a medication take-back program. Check with your pharmacy or law enforcement to find a location. If you cannot return the medication, check the label or package insert to see if the medication should be thrown out in the garbage or flushed down the toilet. If you are not sure,  ask your care team. If it is safe to put it in the trash, empty the medication out of the container. Mix the medication with cat litter, dirt, coffee grounds, or other unwanted substance. Seal the mixture in a bag or container. Put it in the trash. NOTE: This sheet is a summary. It may not cover all possible information. If you have questions about this medicine, talk to your doctor, pharmacist, or health care provider.  2024 Elsevier/Gold Standard (2021-09-11 00:00:00)

## 2024-03-25 ENCOUNTER — Ambulatory Visit: Payer: Self-pay | Admitting: Family Medicine

## 2024-03-25 NOTE — Progress Notes (Signed)
 Thank you :)

## 2024-03-25 NOTE — Progress Notes (Signed)
 Here is her normal EKG; thank you!

## 2024-04-02 ENCOUNTER — Ambulatory Visit: Admitting: Rheumatology

## 2024-04-02 DIAGNOSIS — M7918 Myalgia, other site: Secondary | ICD-10-CM

## 2024-04-02 DIAGNOSIS — M357 Hypermobility syndrome: Secondary | ICD-10-CM

## 2024-04-02 DIAGNOSIS — R7689 Other specified abnormal immunological findings in serum: Secondary | ICD-10-CM

## 2024-04-02 DIAGNOSIS — R5383 Other fatigue: Secondary | ICD-10-CM

## 2024-04-02 DIAGNOSIS — I951 Orthostatic hypotension: Secondary | ICD-10-CM

## 2024-04-02 DIAGNOSIS — K582 Mixed irritable bowel syndrome: Secondary | ICD-10-CM

## 2024-04-02 DIAGNOSIS — Z8619 Personal history of other infectious and parasitic diseases: Secondary | ICD-10-CM

## 2024-04-02 DIAGNOSIS — G4709 Other insomnia: Secondary | ICD-10-CM

## 2024-04-02 DIAGNOSIS — E7849 Other hyperlipidemia: Secondary | ICD-10-CM

## 2024-04-02 DIAGNOSIS — Z8711 Personal history of peptic ulcer disease: Secondary | ICD-10-CM

## 2024-04-02 DIAGNOSIS — F4322 Adjustment disorder with anxiety: Secondary | ICD-10-CM

## 2024-04-02 DIAGNOSIS — G43009 Migraine without aura, not intractable, without status migrainosus: Secondary | ICD-10-CM

## 2024-04-02 DIAGNOSIS — K219 Gastro-esophageal reflux disease without esophagitis: Secondary | ICD-10-CM

## 2024-04-02 DIAGNOSIS — E119 Type 2 diabetes mellitus without complications: Secondary | ICD-10-CM

## 2024-04-02 DIAGNOSIS — M2550 Pain in unspecified joint: Secondary | ICD-10-CM

## 2024-04-02 DIAGNOSIS — K9041 Non-celiac gluten sensitivity: Secondary | ICD-10-CM

## 2024-04-02 DIAGNOSIS — M791 Myalgia, unspecified site: Secondary | ICD-10-CM

## 2024-04-02 DIAGNOSIS — G8929 Other chronic pain: Secondary | ICD-10-CM

## 2024-04-13 ENCOUNTER — Other Ambulatory Visit (HOSPITAL_BASED_OUTPATIENT_CLINIC_OR_DEPARTMENT_OTHER): Payer: Self-pay

## 2024-04-24 ENCOUNTER — Encounter: Payer: Self-pay | Admitting: Rheumatology

## 2024-04-24 ENCOUNTER — Encounter: Payer: Self-pay | Admitting: Family Medicine

## 2024-04-24 NOTE — Telephone Encounter (Signed)
 Okay to place referral to [facility]?

## 2024-06-23 ENCOUNTER — Ambulatory Visit: Admitting: Rheumatology
